# Patient Record
Sex: Female | Born: 1975 | Race: Black or African American | Hispanic: No | Marital: Single | Smoking: Current every day smoker
Health system: Southern US, Community
[De-identification: ages and names within clinical notes are randomized; demographics above are authoritative.]

## PROBLEM LIST (undated history)

## (undated) DIAGNOSIS — M1990 Unspecified osteoarthritis, unspecified site: Secondary | ICD-10-CM

## (undated) DIAGNOSIS — R519 Headache, unspecified: Secondary | ICD-10-CM

## (undated) DIAGNOSIS — T8859XA Other complications of anesthesia, initial encounter: Secondary | ICD-10-CM

## (undated) DIAGNOSIS — F32A Depression, unspecified: Secondary | ICD-10-CM

## (undated) DIAGNOSIS — J189 Pneumonia, unspecified organism: Secondary | ICD-10-CM

## (undated) DIAGNOSIS — Z87442 Personal history of urinary calculi: Secondary | ICD-10-CM

## (undated) HISTORY — DX: Unspecified osteoarthritis, unspecified site: M19.90

## (undated) HISTORY — PX: TUBAL LIGATION: SHX77

## (undated) HISTORY — PX: NEPHRECTOMY: SHX65

## (undated) HISTORY — PX: CARPAL TUNNEL RELEASE: SHX101

## (undated) HISTORY — PX: HERNIA REPAIR: SHX51

## (undated) HISTORY — PX: UMBILICAL HERNIA REPAIR: SHX2598

---

## 2012-12-28 ENCOUNTER — Encounter (HOSPITAL_COMMUNITY): Payer: Self-pay | Admitting: Emergency Medicine

## 2012-12-28 ENCOUNTER — Emergency Department (HOSPITAL_COMMUNITY): Payer: Self-pay

## 2012-12-28 ENCOUNTER — Emergency Department (HOSPITAL_COMMUNITY)
Admission: EM | Admit: 2012-12-28 | Discharge: 2012-12-28 | Disposition: A | Discharge status: Home / Self Care | Payer: Self-pay | Attending: Emergency Medicine | Admitting: Emergency Medicine

## 2012-12-28 DIAGNOSIS — S99929A Unspecified injury of unspecified foot, initial encounter: Secondary | ICD-10-CM | POA: Insufficient documentation

## 2012-12-28 DIAGNOSIS — S8990XA Unspecified injury of unspecified lower leg, initial encounter: Secondary | ICD-10-CM | POA: Insufficient documentation

## 2012-12-28 DIAGNOSIS — Y9389 Activity, other specified: Secondary | ICD-10-CM | POA: Insufficient documentation

## 2012-12-28 DIAGNOSIS — M25562 Pain in left knee: Secondary | ICD-10-CM

## 2012-12-28 DIAGNOSIS — Y92410 Unspecified street and highway as the place of occurrence of the external cause: Secondary | ICD-10-CM | POA: Insufficient documentation

## 2012-12-28 DIAGNOSIS — F17200 Nicotine dependence, unspecified, uncomplicated: Secondary | ICD-10-CM | POA: Insufficient documentation

## 2012-12-28 DIAGNOSIS — M25561 Pain in right knee: Secondary | ICD-10-CM

## 2012-12-28 MED ORDER — TRAMADOL HCL 50 MG PO TABS
50.0000 mg | ORAL_TABLET | Freq: Four times a day (QID) | ORAL | Status: DC | PRN
Start: 2012-12-28 — End: 2013-03-16

## 2012-12-28 MED ORDER — IBUPROFEN 600 MG PO TABS: 600.0000 mg | ORAL_TABLET | Freq: Four times a day (QID) | ORAL | Status: DC | PRN
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
## 2012-12-28 NOTE — ED Provider Notes (Signed)
Medical screening examination/treatment/procedure(s) were performed by non-physician practitioner and as supervising physician I was immediately available for consultation/collaboration.  Yariah Selvey, MD 12/28/12 1542 

## 2012-12-28 NOTE — ED Notes (Signed)
PA at the bedside at this time.

## 2013-03-16 ENCOUNTER — Encounter: Payer: Self-pay | Admitting: Internal Medicine

## 2013-03-16 ENCOUNTER — Ambulatory Visit: Payer: Self-pay | Attending: Internal Medicine | Admitting: Internal Medicine

## 2013-03-16 VITALS — BP 118/76 | HR 85 | Temp 98.6°F | Resp 16 | Ht 64.0 in | Wt 142.0 lb

## 2013-03-16 DIAGNOSIS — N920 Excessive and frequent menstruation with regular cycle: Secondary | ICD-10-CM | POA: Insufficient documentation

## 2013-03-16 DIAGNOSIS — R109 Unspecified abdominal pain: Secondary | ICD-10-CM

## 2013-03-16 DIAGNOSIS — Z905 Acquired absence of kidney: Secondary | ICD-10-CM | POA: Insufficient documentation

## 2013-03-16 DIAGNOSIS — R10A1 Flank pain, right side: Secondary | ICD-10-CM | POA: Insufficient documentation

## 2013-03-16 DIAGNOSIS — R300 Dysuria: Secondary | ICD-10-CM | POA: Insufficient documentation

## 2013-03-16 MED ORDER — CIPROFLOXACIN HCL 500 MG PO TABS: 500.0000 mg | ORAL_TABLET | Freq: Two times a day (BID) | ORAL | Status: DC

## 2013-03-16 MED ORDER — TRAMADOL HCL 50 MG PO TABS: 50.0000 mg | ORAL_TABLET | Freq: Four times a day (QID) | ORAL | Status: DC | PRN

## 2013-03-16 NOTE — Progress Notes (Signed)
Patient ID: Kristin Montes, female   DOB: 04-14-1976, 37 y.o.   MRN: [id number] Patient Demographics  Kristin Montes, is a 37 y.o. female  QMV:[id number]  MWU:[id number]  DOB - 1976/04/12  Chief Complaint  Patient presents with  . Establish Care    HX KIDNEY INFECTION  . Abdominal Pain  . Back Pain        Subjective:   Kristin Montes today is here to establish primary care.  The patient is a 37 year old female with history of left-sided nephrectomy due to kidney stones. She presented to the clinic For establishing care. Patient reports that for the last 3 days she's been having dysuria, foul-smelling urine, brownish. She is also noticed that she is having right flank pain, yesterday had chills but did not check temperature. Patient has No headache, No chest pain, No Nausea, No new weakness tingling or numbness, No Cough - SOB.   Objective:    Filed Vitals:   03/16/13 1731  BP: 118/76  Pulse: 85  Temp: 98.6 F (37 C)  TempSrc: Oral  Resp: 16  Height: 5\' 4"  (1.626 m)  Weight: 142 lb (64.411 kg)  SpO2: 99%     ALLERGIES:  No Known Allergies  PAST MEDICAL HISTORY: History reviewed. No pertinent past medical history.  PAST SURGICAL HISTORY: Past Surgical History  Procedure Laterality Date  . Hernia repair    . Tubal ligation    . Nephrectomy      FAMILY HISTORY: History reviewed. No pertinent family history.  MEDICATIONS AT HOME: Prior to Admission medications   Medication Sig Start Date End Date Taking? Authorizing Provider  ibuprofen (ADVIL,MOTRIN) 600 MG tablet Take 1 tablet (600 mg total) by mouth every 6 (six) hours as needed for pain. 12/28/12  Yes Renne Crigler, PA-C  ciprofloxacin (CIPRO) 500 MG tablet Take 1 tablet (500 mg total) by mouth 2 (two) times daily. 03/16/13   Ripudeep Jenna Luo, MD  traMADol (ULTRAM) 50 MG tablet Take 1 tablet (50 mg total) by mouth every 6 (six) hours as needed for pain. 03/16/13   Ripudeep Jenna Luo, MD    REVIEW OF SYSTEMS:   Constitutional:   No   Fevers, chills, fatigue.  HEENT:    No headaches, Sore throat,   Cardio-vascular: No chest pain,  Orthopnea, swelling in lower extremities, anasarca, palpitations  GI:  No abdominal pain, nausea, vomiting, diarrhea  Resp: No shortness of breath,  No coughing up of blood.No cough.No wheezing.  Skin:  no rash or lesions.  GU:  Please see history of present illness  Musculoskeletal: No joint pain or swelling.  No decreased range of motion.  No back pain.  Psych: No change in mood or affect. No depression or anxiety.  No memory loss.   Exam  General appearance :Awake, alert, NAD, Speech Clear. HEENT: Atraumatic and Normocephalic, PERLA Neck: supple, no JVD. No cervical lymphadenopathy.  Chest: clear to auscultation bilaterally, no wheezing, rales or rhonchi CVS: S1 S2 regular, no murmurs.  Abdomen: soft, NBS, NT, ND, no gaurding, rigidity or rebound. No CVAT Extremities: No cyanosis, clubbing, B/L Lower Ext shows no edema,  Neurology: Awake alert, and oriented X 3, CN II-XII intact, Non focal Skin:No Rash or lesions Wounds: N/A    Data Review   Basic Metabolic Panel: No results found for this basename: NA, K, CL, CO2, GLUCOSE, BUN, CREATININE, CALCIUM, MG, PHOS,  in the last 168 hours Liver Function Tests: No results found for this basename: AST, ALT, ALKPHOS, BILITOT, PROT, ALBUMIN,  in the last 168 hours  CBC: No results found for this basename: WBC, NEUTROABS, HGB, HCT, MCV, PLT,  in the last 168 hours ------------------------------------------------------------------------------------------------------------------ No results found for this basename: HGBA1C,  in the last 72 hours ------------------------------------------------------------------------------------------------------------------ No results found for this basename: CHOL, HDL, LDLCALC, TRIG, CHOLHDL, LDLDIRECT,  in the last 72  hours ------------------------------------------------------------------------------------------------------------------ No results found for this basename: TSH, T4TOTAL, FREET3, T3FREE, THYROIDAB,  in the last 72 hours ------------------------------------------------------------------------------------------------------------------ No results found for this basename: VITAMINB12, FOLATE, FERRITIN, TIBC, IRON, RETICCTPCT,  in the last 72 hours  Coagulation profile  No results found for this basename: INR, PROTIME,  in the last 168 hours    Assessment & Plan   Active Problems: Dysuria with right flank pain: Concerning for pyelonephritis with UTI, patient already has a history of left nephrectomy - Will check a urine dipstick, urine culture - Placed on ciprofloxacin 500 mg BID -Check CBC, BMET   Menorrhagia: - Will send a ambulatory referral to OB/GYN for Pap smear and evaluation of menorrhagia Patient reports that she had tubal ligation  Recommendations:Followup on the labs  Follow-up in 3 weeks   RAI,RIPUDEEP M.D. 03/16/2013, 6:09 PM

## 2013-03-16 NOTE — Progress Notes (Signed)
PT HERE TO ESTABLISH CARE S/P R NEPHRECTOMY 2003 C/O FLARE UP R FLANK PAIN RADIATING TO BACK TAKING ALEVE LMP 03/07/13 DENIES HEMATURIA,AFEBRILE

## 2013-03-30 ENCOUNTER — Ambulatory Visit: Payer: Self-pay

## 2013-04-11 ENCOUNTER — Ambulatory Visit: Payer: Self-pay | Admitting: Family Medicine

## 2013-04-11 ENCOUNTER — Ambulatory Visit: Payer: Self-pay

## 2013-04-18 ENCOUNTER — Ambulatory Visit: Payer: Self-pay

## 2013-12-16 ENCOUNTER — Emergency Department (HOSPITAL_COMMUNITY)
Admission: EM | Admit: 2013-12-16 | Discharge: 2013-12-16 | Disposition: A | Discharge status: Home / Self Care | Payer: No Typology Code available for payment source | Attending: Emergency Medicine | Admitting: Emergency Medicine

## 2013-12-16 ENCOUNTER — Encounter (HOSPITAL_COMMUNITY): Payer: Self-pay | Admitting: Emergency Medicine

## 2013-12-16 DIAGNOSIS — Z905 Acquired absence of kidney: Secondary | ICD-10-CM | POA: Insufficient documentation

## 2013-12-16 DIAGNOSIS — R1084 Generalized abdominal pain: Secondary | ICD-10-CM | POA: Insufficient documentation

## 2013-12-16 DIAGNOSIS — R197 Diarrhea, unspecified: Secondary | ICD-10-CM | POA: Insufficient documentation

## 2013-12-16 DIAGNOSIS — Z9851 Tubal ligation status: Secondary | ICD-10-CM | POA: Insufficient documentation

## 2013-12-16 DIAGNOSIS — R509 Fever, unspecified: Secondary | ICD-10-CM | POA: Insufficient documentation

## 2013-12-16 DIAGNOSIS — F17200 Nicotine dependence, unspecified, uncomplicated: Secondary | ICD-10-CM | POA: Insufficient documentation

## 2013-12-16 DIAGNOSIS — R1012 Left upper quadrant pain: Secondary | ICD-10-CM | POA: Insufficient documentation

## 2013-12-16 DIAGNOSIS — Z792 Long term (current) use of antibiotics: Secondary | ICD-10-CM | POA: Insufficient documentation

## 2013-12-16 DIAGNOSIS — Z3202 Encounter for pregnancy test, result negative: Secondary | ICD-10-CM | POA: Insufficient documentation

## 2013-12-16 DIAGNOSIS — R112 Nausea with vomiting, unspecified: Secondary | ICD-10-CM | POA: Insufficient documentation

## 2013-12-16 DIAGNOSIS — Z87442 Personal history of urinary calculi: Secondary | ICD-10-CM | POA: Insufficient documentation

## 2013-12-16 DIAGNOSIS — Z791 Long term (current) use of non-steroidal anti-inflammatories (NSAID): Secondary | ICD-10-CM | POA: Insufficient documentation

## 2013-12-16 LAB — CBC WITH DIFFERENTIAL/PLATELET
Basophils Absolute: 0 10*3/uL (ref 0.0–0.1)
Basophils Relative: 0 % (ref 0–1)
Eosinophils Absolute: 0.1 10*3/uL (ref 0.0–0.7)
Eosinophils Relative: 2 % (ref 0–5)
HCT: 41.5 % (ref 36.0–46.0)
HEMOGLOBIN: 14.4 g/dL (ref 12.0–15.0)
LYMPHS ABS: 0.5 10*3/uL — AB (ref 0.7–4.0)
Lymphocytes Relative: 8 % — ABNORMAL LOW (ref 12–46)
MCH: 32.6 pg (ref 26.0–34.0)
MCHC: 34.7 g/dL (ref 30.0–36.0)
MCV: 93.9 fL (ref 78.0–100.0)
MONOS PCT: 6 % (ref 3–12)
Monocytes Absolute: 0.4 10*3/uL (ref 0.1–1.0)
NEUTROS ABS: 5.5 10*3/uL (ref 1.7–7.7)
NEUTROS PCT: 84 % — AB (ref 43–77)
Platelets: 173 10*3/uL (ref 150–400)
RBC: 4.42 MIL/uL (ref 3.87–5.11)
RDW: 13.2 % (ref 11.5–15.5)
WBC: 6.6 10*3/uL (ref 4.0–10.5)

## 2013-12-16 LAB — COMPREHENSIVE METABOLIC PANEL
ALBUMIN: 3.8 g/dL (ref 3.5–5.2)
ALK PHOS: 77 U/L (ref 39–117)
ALT: 14 U/L (ref 0–35)
AST: 20 U/L (ref 0–37)
BILIRUBIN TOTAL: 0.5 mg/dL (ref 0.3–1.2)
BUN: 14 mg/dL (ref 6–23)
CHLORIDE: 105 meq/L (ref 96–112)
CO2: 21 mEq/L (ref 19–32)
Calcium: 9.2 mg/dL (ref 8.4–10.5)
Creatinine, Ser: 0.89 mg/dL (ref 0.50–1.10)
GFR calc non Af Amer: 82 mL/min — ABNORMAL LOW (ref >90)
GLUCOSE: 95 mg/dL (ref 70–99)
POTASSIUM: 4.2 meq/L (ref 3.7–5.3)
Sodium: 139 mEq/L (ref 137–147)
Total Protein: 7.4 g/dL (ref 6.0–8.3)

## 2013-12-16 LAB — URINALYSIS, ROUTINE W REFLEX MICROSCOPIC
BILIRUBIN URINE: NEGATIVE
Glucose, UA: NEGATIVE mg/dL
HGB URINE DIPSTICK: NEGATIVE
KETONES UR: 15 mg/dL — AB
Leukocytes, UA: NEGATIVE
NITRITE: NEGATIVE
PH: 5.5 (ref 5.0–8.0)
Protein, ur: NEGATIVE mg/dL
Specific Gravity, Urine: 1.022 (ref 1.005–1.030)
Urobilinogen, UA: 0.2 mg/dL (ref 0.0–1.0)

## 2013-12-16 LAB — POC URINE PREG, ED: PREG TEST UR: NEGATIVE

## 2013-12-16 LAB — LIPASE, BLOOD: Lipase: 92 U/L — ABNORMAL HIGH (ref 11–59)

## 2013-12-16 MED ORDER — ONDANSETRON HCL 4 MG/2ML IJ SOLN
4.0000 mg | Freq: Once | INTRAMUSCULAR | Status: AC
  Administered 2013-12-16: 4 mg via INTRAVENOUS
  Filled 2013-12-16: qty 2

## 2013-12-16 MED ORDER — MORPHINE SULFATE 4 MG/ML IJ SOLN: 4.0000 mg | INTRAMUSCULAR | Status: DC | PRN

## 2013-12-16 MED ORDER — OXYCODONE-ACETAMINOPHEN 5-325 MG PO TABS: 1.0000 | ORAL_TABLET | ORAL | Status: DC | PRN

## 2013-12-16 MED ORDER — SODIUM CHLORIDE 0.9 % IV BOLUS (SEPSIS)
1000.0000 mL | Freq: Once | INTRAVENOUS | Status: AC
  Administered 2013-12-16: 1000 mL via INTRAVENOUS

## 2013-12-16 MED ORDER — ONDANSETRON HCL 4 MG PO TABS: 4.0000 mg | ORAL_TABLET | Freq: Four times a day (QID) | ORAL | Status: DC

## 2013-12-16 NOTE — ED Notes (Signed)
Pt states she has been nauseas off and on with diarrhea X 4 and vomiting X 3 since this morning, mild abdominal pain to LLQ, non tender to palpation. States she ate seafood last night. Pt son was sick with a virus the other day, lasted one day.

## 2013-12-16 NOTE — ED Notes (Signed)
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

## 2013-12-16 NOTE — ED Notes (Signed)
Pt informed to follow up to check pancreas with primary care doctor, avoid caffeine and greasy foods, and to increase clear liquids.

## 2013-12-16 NOTE — ED Provider Notes (Signed)
CSN: [id number]     Arrival date & time 12/16/13  1421 History   First MD Initiated Contact with Patient 12/16/13 1632     Chief Complaint  Patient presents with  . Emesis     (Consider location/radiation/quality/duration/timing/severity/associated sxs/prior Treatment) HPI Comments: Patient is a 38 year old female with history of nephrectomy who presents today with nausea, vomiting, diarrhea 7 AM today. She generally has not felt well. Her emesis is nonbilious, nonbloody. She has mild, generalized, crampy abdominal pain. She believes she has a fever because she feels warm, but has not measured it. She has not taken any medications for her symptoms including any antipyretic. 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 She denies any abnormal food or recent travel. Her son had similar symptoms which resolved without 48 hours.  The history is provided by the patient. No language interpreter was used.    History reviewed. No pertinent past medical history. Past Surgical History  Procedure Laterality Date  . Hernia repair    . Tubal ligation    . Nephrectomy     History reviewed. No pertinent family history. History  Substance Use Topics  . Smoking status: Current Every Day Smoker    Types: Cigarettes  . Smokeless tobacco: Not on file     Comment: 6 cigs/day  . Alcohol Use: Yes     Comment: occasionally   OB History   Grav Para Term Preterm Abortions TAB SAB Ect Mult Living                 Review of Systems  Constitutional: Positive for fever (subjective).  Respiratory: Negative for shortness of breath.   Cardiovascular: Negative for chest pain.  Gastrointestinal: Positive for nausea, vomiting, abdominal pain and diarrhea. Negative for blood in stool and abdominal distention.  All other systems reviewed and are negative.     Allergies  Review of patient's allergies indicates no known allergies.  Home Medications   Prior to  Admission medications   Medication Sig Start Date End Date Taking? Authorizing Provider  ciprofloxacin (CIPRO) 500 MG tablet Take 1 tablet (500 mg total) by mouth 2 (two) times daily. 03/16/13   Ripudeep Jenna LuoK Rai, MD  ibuprofen (ADVIL,MOTRIN) 600 MG tablet Take 1 tablet (600 mg total) by mouth every 6 (six) hours as needed for pain. 12/28/12   Renne CriglerJoshua Geiple, PA-C  traMADol (ULTRAM) 50 MG tablet Take 1 tablet (50 mg total) by mouth every 6 (six) hours as needed for pain. 03/16/13   Ripudeep Jenna LuoK Rai, MD   BP 117/77  Pulse 64  Temp(Src) 98.1 F (36.7 C) (Oral)  Resp 14  Ht 5\' 4"  (1.626 m)  Wt 133 lb (60.328 kg)  BMI 22.82 kg/m2  SpO2 99%  LMP 11/30/2013 Physical Exam  Nursing note and vitals reviewed. Constitutional: She is oriented to person, place, and time. She appears well-developed and well-nourished. She does not appear ill. No distress.  Patient is laying comfortably in bed.   HENT:  Head: Normocephalic and atraumatic.  Right Ear: External ear normal.  Left Ear: External ear normal.  Nose: Nose normal.  Mouth/Throat: Oropharynx is clear and moist.  Eyes: Conjunctivae are normal.  Neck: Normal range of motion.  Cardiovascular: Normal rate, regular rhythm and normal heart sounds.   Pulmonary/Chest: Effort normal and breath sounds normal. No stridor. No respiratory distress. She has no wheezes. She has no rales.  Abdominal: Soft. She exhibits no distension.  There is generalized tenderness.  Mild generalized tenderness to deep palpation. Worse in LUQ.   Musculoskeletal: Normal range of motion.  Neurological: She is alert and oriented to person, place, and time. She has normal strength.  Skin: Skin is warm and dry. She is not diaphoretic. No erythema.  Psychiatric: She has a normal mood and affect. Her behavior is normal.    ED Course  Procedures (including critical care time) Labs Review Labs Reviewed  CBC WITH DIFFERENTIAL - Abnormal; Notable for the following:    Neutrophils  Relative % 84 (*)    Lymphocytes Relative 8 (*)    Lymphs Abs 0.5 (*)    All other components within normal limits  COMPREHENSIVE METABOLIC PANEL - Abnormal; Notable for the following:    GFR calc non Af Amer 82 (*)    All other components within normal limits  URINALYSIS, ROUTINE W REFLEX MICROSCOPIC - Abnormal; Notable for the following:    Ketones, ur 15 (*)    All other components within normal limits  LIPASE, BLOOD - Abnormal; Notable for the following:    Lipase 92 (*)    All other components within normal limits  POC URINE PREG, ED    Imaging Review No results found.   EKG Interpretation None      MDM   Final diagnoses:  Nausea & vomiting  Diarrhea   Patient is nontoxic, nonseptic appearing, in no apparent distress.  Patient's pain and other symptoms adequately managed in emergency department.  Fluid bolus given.  Labs and vitals reviewed.  Patient does not meet the SIRS or Sepsis criteria.  On repeat exam patient does not have a surgical abdomen and there are nor peritoneal signs.  No indication of appendicitis, bowel obstruction, bowel perforation, cholecystitis, diverticulitis, PID or ectopic pregnancy. Patient does have an elevated lipase. I discussed this with the patient who will follow up to ensure this resolves. Discussed clear liquid diet. Patient discharged home with symptomatic treatment and given strict instructions for follow-up with their primary care physician.  I have also discussed reasons to return immediately to the ER.  Patient expresses understanding and agrees with plan. Discussed case with Dr. Karma Ganja who agrees with plan. Patient / Family / Caregiver informed of clinical course, understand medical decision-making process, and agree with plan.       Mora Bellman, PA-C 12/17/13 1037

## 2013-12-16 NOTE — Discharge Instructions (Signed)
Viral Infections °A virus is a type of germ. Viruses can cause: °· Minor sore throats. °· Aches and pains. °· Headaches. °· Runny nose. °· Rashes. °· Watery eyes. °· Tiredness. °· Coughs. °· Loss of appetite. °· Feeling sick to your stomach (nausea). °· Throwing up (vomiting). °· Watery poop (diarrhea). °HOME CARE  °· Only take medicines as told by your doctor. °· Drink enough water and fluids to keep your pee (urine) clear or pale yellow. Sports drinks are a good choice. °· Get plenty of rest and eat healthy. Soups and broths with crackers or rice are fine. °GET HELP RIGHT AWAY IF:  °· You have a very bad headache. °· You have shortness of breath. °· You have chest pain or neck pain. °· You have an unusual rash. °· You cannot stop throwing up. °· You have watery poop that does not stop. °· You cannot keep fluids down. °· You or your child has a temperature by mouth above 102° F (38.9° C), not controlled by medicine. °· Your baby is older than 3 months with a rectal temperature of 102° F (38.9° C) or higher. °· Your baby is 3 months old or younger with a rectal temperature of 100.4° F (38° C) or higher. °MAKE SURE YOU:  °· Understand these instructions. °· Will watch this condition. °· Will get help right away if you are not doing well or get worse. °Document Released: 06/10/2008 Document Revised: 09/20/2011 Document Reviewed: 11/03/2010 °[employer]® Patient Information ©2014 [employer]. ° °

## 2013-12-19 NOTE — ED Provider Notes (Signed)
Medical screening examination/treatment/procedure(s) were performed by non-physician practitioner and as supervising physician I was immediately available for consultation/collaboration.   EKG Interpretation None       Derak Schurman K Linker, MD 12/19/13 1503 

## 2017-12-28 ENCOUNTER — Emergency Department (HOSPITAL_COMMUNITY)
Admission: EM | Admit: 2017-12-28 | Discharge: 2017-12-28 | Disposition: A | Discharge status: Home / Self Care | Payer: Self-pay | Attending: Emergency Medicine | Admitting: Emergency Medicine

## 2017-12-28 ENCOUNTER — Encounter (HOSPITAL_COMMUNITY): Payer: Self-pay | Admitting: Emergency Medicine

## 2017-12-28 ENCOUNTER — Emergency Department (HOSPITAL_COMMUNITY): Payer: Self-pay

## 2017-12-28 DIAGNOSIS — S93401A Sprain of unspecified ligament of right ankle, initial encounter: Secondary | ICD-10-CM | POA: Insufficient documentation

## 2017-12-28 DIAGNOSIS — W1842XA Slipping, tripping and stumbling without falling due to stepping into hole or opening, initial encounter: Secondary | ICD-10-CM | POA: Insufficient documentation

## 2017-12-28 DIAGNOSIS — Y939 Activity, unspecified: Secondary | ICD-10-CM | POA: Insufficient documentation

## 2017-12-28 DIAGNOSIS — Z79899 Other long term (current) drug therapy: Secondary | ICD-10-CM | POA: Insufficient documentation

## 2017-12-28 DIAGNOSIS — Y929 Unspecified place or not applicable: Secondary | ICD-10-CM | POA: Insufficient documentation

## 2017-12-28 DIAGNOSIS — F17210 Nicotine dependence, cigarettes, uncomplicated: Secondary | ICD-10-CM | POA: Insufficient documentation

## 2017-12-28 DIAGNOSIS — Y999 Unspecified external cause status: Secondary | ICD-10-CM | POA: Insufficient documentation

## 2017-12-28 NOTE — ED Notes (Signed)
Patient transported to X-ray 

## 2017-12-28 NOTE — ED Provider Notes (Signed)
[facility] EMERGENCY DEPARTMENT Provider Note   CSN: [id number] Arrival date & time: 12/28/17  1024     History   Chief Complaint Chief Complaint  Patient presents with  . Ankle Pain    HPI Kristin Montes is a 42 y.o. female presenting for evaluation of right ankle pain.  Patient states she developed acute onset right ankle pain after stepping into a hole on Monday night.  She treated this with rest, ice, elevation, NSAIDs, and Tylenol yesterday.  When she woke up this morning, pain was still present and ankle still swollen.  She is here to get this checked.  She denies numbness or tingling.  She denies injury elsewhere.  She is able to walk, but walks with a limp due to pain.  She has a history of a nephrectomy, states she has no problems taking NSAIDs.  She denies any other medical problems, does not take medications daily.  Pain is constant, worse with movement and palpation.  No radiation of the pain.  Nothing has made it better.  HPI  History reviewed. No pertinent past medical history.  Patient Active Problem List   Diagnosis Date Noted  . 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 Right flank pain 03/16/2013  . Dysuria 03/16/2013    Past Surgical History:  Procedure Laterality Date  . HERNIA REPAIR    . NEPHRECTOMY    . TUBAL LIGATION       OB History   None      Home Medications    Prior to Admission medications   Medication Sig Start Date End Date Taking? Authorizing Provider  ondansetron (ZOFRAN) 4 MG tablet Take 1 tablet (4 mg total) by mouth every 6 (six) hours. 12/16/13   Junious Silk, PA-C  oxyCODONE-acetaminophen (PERCOCET/ROXICET) 5-325 MG per tablet Take 1 tablet by mouth every 4 (four) hours as needed for severe pain. May take 2 tablets PO q 6 hours for severe pain - Do not take with Tylenol as this tablet already contains tylenol 12/16/13   Junious Silk, PA-C    Family History No family history on file.  Social History Social History     Tobacco Use  . Smoking status: Current Every Day Smoker    Types: Cigarettes  . Tobacco comment: 6 cigs/day  Substance Use Topics  . Alcohol use: Yes    Comment: occasionally  . Drug use: No     Allergies   Patient has no known allergies.   Review of Systems Review of Systems  Musculoskeletal: Positive for arthralgias and joint swelling.  Neurological: Negative for numbness.     Physical Exam Updated Vital Signs BP (!) 134/103   Pulse 93   Temp 98.1 F (36.7 C) (Oral)   Resp 12   LMP 12/07/2017   SpO2 100%   Physical Exam  Constitutional: She is oriented to person, place, and time. She appears well-developed and well-nourished. No distress.  HENT:  Head: Normocephalic and atraumatic.  Eyes: EOM are normal.  Neck: Normal range of motion.  Pulmonary/Chest: Effort normal.  Abdominal: She exhibits no distension.  Musculoskeletal: She exhibits edema and tenderness.  Swelling of the lateral malleolus and dorsal lateral right ankle.  Pedal pulses intact.  Pain with palpation of the lateral aspect of the ankle.  Good cap refill.  Able to wiggle toes without difficulty.  Unable/unwilling to range ankle due to pain.  Sensation intact bilaterally.  Neurological: She is alert and oriented to person, place, and time. No sensory deficit.  Skin: Skin is warm. Capillary refill takes less than 2 seconds. No rash noted.  Psychiatric: She has a normal mood and affect.  Nursing note and vitals reviewed.    ED Treatments / Results  Labs (all labs ordered are listed, but only abnormal results are displayed) Labs Reviewed - No data to display  EKG None  Radiology Dg Ankle Complete Right  Result Date: 12/28/2017 CLINICAL DATA:  Stepped in hole 2 days ago with persistent ankle pain, initial encounter EXAM: RIGHT ANKLE - COMPLETE 3+ VIEW COMPARISON:  None. FINDINGS: There is no evidence of fracture, dislocation, or joint effusion. There is no evidence of arthropathy or other  focal bone abnormality. Soft tissues are unremarkable. IMPRESSION: No acute abnormality noted. Electronically Signed   By: Alcide CleverMark  Lukens M.D.   On: 12/28/2017 10:59    Procedures Procedures (including critical care time)  Medications Ordered in ED Medications - No data to display   Initial Impression / Assessment and Plan / ED Course  I have reviewed the triage vital signs and the nursing notes.  Pertinent labs & imaging results that were available during my care of the patient were reviewed by me and considered in my medical decision making (see chart for details).     Patient presenting for evaluation of ankle pain.  Physical exam reassuring, she is neurovascularly intact.  X-ray viewed and interpreted by me, no fracture or dislocation.  Discussed findings with patient.  Discussed this is likely an ankle sprain.  Will treat with ASO brace, NSAIDs, Tylenol, ice, and crutches as needed for pain.  Discussed with patient.  Discussed follow-up as needed.  At this time, patient appears safe for discharge.  Return precautions given.  Patient states she understands and agrees to plan.   Final Clinical Impressions(s) / ED Diagnoses   Final diagnoses:  Sprain of right ankle, unspecified ligament, initial encounter    ED Discharge Orders    None       Alveria ApleyCaccavale, Jovon Streetman, PA-C 12/28/17 1209    Mesner, Barbara CowerJason, MD 12/29/17 [phone]

## 2017-12-28 NOTE — Discharge Instructions (Addendum)
1. Medications: Take ibuprofen 3 times a day with meals OR aleve 2 times a day.  Do not take other anti-inflammatories at the same time open (Advil, Motrin, naproxen). You may supplement with Tylenol if you need further pain control. 2. Treatment: Wear ankle brace for at least 2 weeks for stabilization of ankle. Use crutches as needed for comfort. Ice and elevate ankle throughout the day. Alternate between ibuprofen and tylenol for pain relief.  3. Follow-up: Return to the ER if you develop numbness, your foot turns white, or you develop severe worsening pain.

## 2017-12-28 NOTE — ED Triage Notes (Signed)
Pt reports stepping In a hole on Monday, right ankle pain since. Pt ambulatory to triage with limp

## 2017-12-28 NOTE — Progress Notes (Signed)
Orthopedic Tech Progress Note Patient Details:  Kristin Montes [birth year] [id number]  Ortho Devices Type of Ortho Device: Crutches Ortho Device/Splint Interventions: Application   Post Interventions Patient Tolerated: Well Instructions Provided: Care of device Nursing staff applied the aso  Nikki DomCrawford, Shawnika Pepin 12/28/2017, 11:53 AM

## 2018-04-24 ENCOUNTER — Ambulatory Visit: Payer: Medicaid Other | Admitting: Family Medicine

## 2018-04-27 ENCOUNTER — Emergency Department (HOSPITAL_COMMUNITY)
Admission: EM | Admit: 2018-04-27 | Discharge: 2018-04-27 | Discharge status: Against Medical Advice | Payer: Medicaid Other | Attending: Emergency Medicine | Admitting: Emergency Medicine

## 2018-04-27 ENCOUNTER — Encounter (HOSPITAL_COMMUNITY): Payer: Self-pay

## 2018-04-27 DIAGNOSIS — T7840XA Allergy, unspecified, initial encounter: Secondary | ICD-10-CM | POA: Insufficient documentation

## 2018-04-27 DIAGNOSIS — Z5320 Procedure and treatment not carried out because of patient's decision for unspecified reasons: Secondary | ICD-10-CM | POA: Insufficient documentation

## 2018-04-27 DIAGNOSIS — F17210 Nicotine dependence, cigarettes, uncomplicated: Secondary | ICD-10-CM | POA: Insufficient documentation

## 2018-04-27 NOTE — ED Provider Notes (Signed)
[facility]-EMERGENCY DEPT Provider Note   CSN: [id number] Arrival date & time: 04/27/18  1412   History   Chief Complaint Chief Complaint  Patient presents with  . throat irritation    HPI Kristin Montes is a 42 y.o. female with a past history significant for nephrectomy who presents for evaluation of throat irritation.  Per patient she states she was at work and ate approximately 5 pistachios, which she has never eaten before and developed symptoms of throat swelling.  Per patient she states that she felt like she could barely take a deep breath that the inside of her "throat was closing in." Patient states her employer called 911 and the fire station administered an epinephrine injection.  Patient states she felt better after this, however states she feels like she has abnormal sensation to the left side of her throat. Has never had an episode of anaphylaxis previously.  Denies fever, chills,neck pain, neck stiffness, chest pain, shortness of breath, headache, vomiting, diarrhea, rash, hives, lightheadedness, dizziness, swelling of the face or tongue.  HPI  History reviewed. No pertinent past medical history.  Patient Active Problem List   Diagnosis Date Noted  . 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 Right flank pain 03/16/2013  . Dysuria 03/16/2013    Past Surgical History:  Procedure Laterality Date  . HERNIA REPAIR    . NEPHRECTOMY    . TUBAL LIGATION       OB History   None      Home Medications    Prior to Admission medications   Medication Sig Start Date End Date Taking? Authorizing Provider  ondansetron (ZOFRAN) 4 MG tablet Take 1 tablet (4 mg total) by mouth every 6 (six) hours. 12/16/13   Junious Silk, PA-C  oxyCODONE-acetaminophen (PERCOCET/ROXICET) 5-325 MG per tablet Take 1 tablet by mouth every 4 (four) hours as needed for severe pain. May take 2 tablets PO q 6 hours for severe pain - Do not take with Tylenol as this tablet already contains  tylenol 12/16/13   Junious Silk, PA-C    Family History No family history on file.  Social History Social History   Tobacco Use  . Smoking status: Current Every Day Smoker    Types: Cigarettes  . Tobacco comment: 6 cigs/day  Substance Use Topics  . Alcohol use: Yes    Comment: occasionally  . Drug use: No     Allergies   Patient has no known allergies.   Review of Systems Review of Systems  Constitutional: Negative.   HENT: Positive for sore throat. Negative for congestion, dental problem, drooling, ear discharge, ear pain, facial swelling, hearing loss, mouth sores, nosebleeds, postnasal drip, rhinorrhea, sinus pressure, sinus pain, sneezing, tinnitus, trouble swallowing and voice change.   Respiratory: Negative.   Cardiovascular: Negative.   Gastrointestinal: Negative.   Genitourinary: Negative.   Musculoskeletal: Negative.   Skin: Negative.   Allergic/Immunologic: Negative for environmental allergies, food allergies and immunocompromised state.  Neurological: Negative.   All other systems reviewed and are negative.    Physical Exam Updated Vital Signs BP (!) 145/90   Pulse 68   Temp 97.9 F (36.6 C)   Resp 15   Ht 5\' 5"  (1.651 m)   Wt 68.9 kg   LMP 04/16/2018   SpO2 100%   BMI 25.29 kg/m   Physical Exam  Constitutional: She appears well-developed and well-nourished. No distress.  HENT:  Head: Atraumatic.  Right Ear: Tympanic membrane, external ear and ear canal normal. No  drainage, swelling or tenderness. Tympanic membrane is not perforated, not erythematous, not retracted and not bulging.  Left Ear: Tympanic membrane, external ear and ear canal normal. No drainage, swelling or tenderness. Tympanic membrane is not perforated, not erythematous, not retracted and not bulging.  Nose: Nose normal.  Mouth/Throat: Uvula is midline, oropharynx is clear and moist and mucous membranes are normal. No tonsillar exudate.  Uvula midline, no evidence of  oropharyngeal or tongue swelling.  No elevation of palate.  Eyes: Pupils are equal, round, and reactive to light.  Neck: Trachea normal, normal range of motion, full passive range of motion without pain and phonation normal. Neck supple. No tracheal tenderness present. No neck rigidity. No tracheal deviation, no edema, no erythema and normal range of motion present.  Cardiovascular: Normal rate, regular rhythm, normal heart sounds, intact distal pulses and normal pulses.  Pulmonary/Chest: Effort normal and breath sounds normal. No accessory muscle usage or stridor. No tachypnea. No respiratory distress. She has no decreased breath sounds. She has no wheezes. She has no rhonchi. She has no rales.  Lungs clear to auscultation bilaterally.  Is able to speak without difficulty.  Abdominal: Soft. Normal appearance. She exhibits no shifting dullness, no distension, no pulsatile liver, no fluid wave, no abdominal bruit, no ascites, no pulsatile midline mass and no mass. There is no hepatosplenomegaly. There is no tenderness. No hernia.  Musculoskeletal: Normal range of motion.  Neurological: She is alert.  Skin: Skin is warm, dry and intact. No purpura and no rash noted. Rash is not macular, not papular, not nodular, not pustular, not vesicular and not urticarial. She is not diaphoretic. No pallor.  No urticaria, lesions or rashes.  Psychiatric: She has a normal mood and affect.  Nursing note and vitals reviewed.    ED Treatments / Results  Labs (all labs ordered are listed, but only abnormal results are displayed) Labs Reviewed - No data to display  EKG None  Radiology No results found.  Procedures Procedures (including critical care time)  Medications Ordered in ED Medications - No data to display   Initial Impression / Assessment and Plan / ED Course  I have reviewed the triage vital signs and the nursing notes.  Pertinent labs & imaging results that were available during my care of  the patient were reviewed by me and considered in my medical decision making (see chart for details).  42 year old otherwise well-appearing female presents for evaluation of throat irritation.  Irritation occurred after eating 5 pistachios.  Sensation of throat swelling and shortness of breath at work.  Administered epinephrine by fire rescue with relief of symptoms.  No symptoms on initial evaluation.  No skin manifestations.  Patient does not appear in any distress. No episodes of hypoxia while in the department.  Discussed with patient plan, treatment and observation for possible allergic reaction.  She refuses to stay.  Discussed with patient risks of leaving without treatment included additional reaction or death due to allergic reaction.  Patient states to this provider and nursing staff she is chosen to leave AGAINST MEDICAL ADVICE.    Final Clinical Impressions(s) / ED Diagnoses   Final diagnoses:  Allergic reaction, initial encounter    ED Discharge Orders    None       Henderly, Britni A, PA-C 04/27/18 1543    Tilden Fossa, MD 04/28/18 [phone]

## 2018-04-27 NOTE — ED Triage Notes (Signed)
Per ems: Pt ate 5 pistachios around 13:15.  Pt had never had pistachios before.  No pt hx of food or nut allergies.  Fire gave epi when there arrived and placed her on non-rebreather.  When EMS arrived they took pt off O2, and saw no signs of reaction or respiratory distress.

## 2018-04-27 NOTE — ED Notes (Signed)
Bed: WA20 Expected date:  Expected time:  Means of arrival:  Comments: EMS allergic reaction? Throat irriation

## 2018-08-03 ENCOUNTER — Ambulatory Visit: Payer: Medicaid Other | Admitting: Family Medicine

## 2019-01-02 ENCOUNTER — Encounter (HOSPITAL_COMMUNITY): Payer: Self-pay

## 2019-01-02 ENCOUNTER — Ambulatory Visit (HOSPITAL_COMMUNITY)
Admission: EM | Admit: 2019-01-02 | Discharge: 2019-01-02 | Disposition: A | Discharge status: Home / Self Care | Payer: Medicaid Other | Attending: Family Medicine | Admitting: Family Medicine

## 2019-01-02 ENCOUNTER — Other Ambulatory Visit: Payer: Self-pay

## 2019-01-02 DIAGNOSIS — W19XXXD Unspecified fall, subsequent encounter: Secondary | ICD-10-CM

## 2019-01-02 DIAGNOSIS — S8001XD Contusion of right knee, subsequent encounter: Secondary | ICD-10-CM

## 2019-01-02 MED ORDER — IBUPROFEN 600 MG PO TABS: 600.0000 mg | ORAL_TABLET | Freq: Four times a day (QID) | ORAL | 0 refills | Status: DC | PRN

## 2019-01-02 NOTE — ED Triage Notes (Signed)
Pt states she fell on Sunday. Pt states she tripped over a guard rail. Pt cc right leg pain .

## 2019-01-02 NOTE — ED Provider Notes (Signed)
[facility]    CSN: [id number] Arrival date & time: 01/02/19  1421     History   Chief Complaint Chief Complaint  Patient presents with  . Fall    HPI Kristin Montes is a 43 y.o. female. Presents today with right knee pain s/p tripped over railing on 12/31/2018; states she did not lift her flipflop up high enough and fell. Denies dizziness or palpitations before fall. States [place] was [place] in [place] in [place] yesterday; had xray which was negative. She states she was sent home from work today due to pain and needs work note. LMP: 12/29/2018.    History reviewed. No pertinent past medical history.  Patient Active Problem List   Diagnosis Date Noted  . 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 Right flank pain 03/16/2013  . Dysuria 03/16/2013    Past Surgical History:  Procedure Laterality Date  . HERNIA REPAIR    . NEPHRECTOMY    . TUBAL LIGATION      OB History   No obstetric history on file.      Home Medications    Prior to Admission medications   Medication Sig Start Date End Date Taking? Authorizing Provider  ibuprofen (ADVIL) 600 MG tablet Take 1 tablet (600 mg total) by mouth every 6 (six) hours as needed. 01/02/19   Mickie Bailate, Corley Maffeo H, NP  ondansetron (ZOFRAN) 4 MG tablet Take 1 tablet (4 mg total) by mouth every 6 (six) hours. 12/16/13   Junious SilkMerrell, Hannah, PA-C  oxyCODONE-acetaminophen (PERCOCET/ROXICET) 5-325 MG per tablet Take 1 tablet by mouth every 4 (four) hours as needed for severe pain. May take 2 tablets PO q 6 hours for severe pain - Do not take with Tylenol as this tablet already contains tylenol 12/16/13   Junious SilkMerrell, Hannah, PA-C    Family History History reviewed. No pertinent family history.  Social History Social History   Tobacco Use  . Smoking status: Current Every Day Smoker    Types: Cigarettes  . Smokeless tobacco: Never Used  . Tobacco comment: 6 cigs/day  Substance Use Topics  . Alcohol use: Yes    Comment: occasionally  . Drug use: No      Allergies   Patient has no known allergies.   Review of Systems Review of Systems  Constitutional: Negative for chills and fever.  HENT: Negative for ear pain and sore throat.   Eyes: Negative for pain and visual disturbance.  Respiratory: Negative for cough and shortness of breath.   Cardiovascular: Negative for chest pain and palpitations.  Gastrointestinal: Negative for abdominal pain and vomiting.  Genitourinary: Negative for dysuria and hematuria.  Musculoskeletal: Positive for arthralgias and gait problem. Negative for back pain.  Skin: Negative for color change and rash.  Neurological: Negative for seizures and syncope.  All other systems reviewed and are negative.    Physical Exam Triage Vital Signs ED Triage Vitals  Enc Vitals Group     BP 01/02/19 1452 127/81     Pulse Rate 01/02/19 1452 75     Resp 01/02/19 1452 18     Temp 01/02/19 1452 98.8 F (37.1 C)     Temp Source 01/02/19 1452 Oral     SpO2 01/02/19 1452 100 %     Weight 01/02/19 1447 150 lb (68 kg)     Height --      Head Circumference --      Peak Flow --      Pain Score 01/02/19 1447 10  Pain Loc --      Pain Edu? --      Excl. in [place]? --    No data found.  Updated Vital Signs BP 127/81 (BP Location: Right Arm)   Pulse 75   Temp 98.8 F (37.1 C) (Oral)   Resp 18   Wt 150 lb (68 kg)   LMP 12/29/2018   SpO2 100%   BMI 24.96 kg/m   Visual Acuity Right Eye Distance:   Left Eye Distance:   Bilateral Distance:    Right Eye Near:   Left Eye Near:    Bilateral Near:     Physical Exam Vitals signs and nursing note reviewed.  Constitutional:      General: She is not in acute distress.    Appearance: She is well-developed.  HENT:     Head: Normocephalic and atraumatic.  Eyes:     Conjunctiva/sclera: Conjunctivae normal.  Neck:     Musculoskeletal: Neck supple.  Cardiovascular:     Rate and Rhythm: Normal rate and regular rhythm.  Pulmonary:     Effort: Pulmonary effort is  normal. No respiratory distress.     Breath sounds: Normal breath sounds.  Abdominal:     Palpations: Abdomen is soft.     Tenderness: There is no abdominal tenderness.  Musculoskeletal:     Right knee: She exhibits swelling. She exhibits no ecchymosis, no deformity and normal alignment. Tenderness found.       Legs:  Skin:    General: Skin is warm and dry.     Findings: Abrasion present.          Comments: Superficial abrasions on bilateral knees, R>L.   Neurological:     Mental Status: She is alert.      UC Treatments / Results  Labs (all labs ordered are listed, but only abnormal results are displayed) Labs Reviewed - No data to display  EKG None  Radiology No results found.  Procedures Procedures (including critical care time)  Medications Ordered in UC Medications - No data to display  Initial Impression / Assessment and Plan / UC Course  I have reviewed the triage vital signs and the nursing notes.  Pertinent labs & imaging results that were available during my care of the patient were reviewed by me and considered in my medical decision making (see chart for details).   Right knee contusion s/p fall.  Treating with ibuprofen and Tylenol.    Final Clinical Impressions(s) / UC Diagnoses   Final diagnoses:  Contusion of right knee, subsequent encounter  Fall, subsequent encounter     Discharge Instructions     Your knee pain is likely caused by your fall.  Take the prescribed ibuprofen for the pain.  You can take Tylenol also.  If your knee pain persists or worsens, return here or follow up with the orthopedic listed.        ED Prescriptions    Medication Sig Dispense Auth. Provider   ibuprofen (ADVIL) 600 MG tablet Take 1 tablet (600 mg total) by mouth every 6 (six) hours as needed. 30 tablet Kristin Balloon, NP     Controlled Substance Prescriptions [employer] Controlled Substance Registry consulted? Not Applicable   Kristin Balloon, NP 01/02/19 1550

## 2019-01-02 NOTE — Discharge Instructions (Addendum)
Your knee pain is likely caused by your fall.  Take the prescribed ibuprofen for the pain.  You can take Tylenol also.  If your knee pain persists or worsens, return here or follow up with the orthopedic listed.

## 2019-01-17 ENCOUNTER — Ambulatory Visit (HOSPITAL_COMMUNITY)
Admission: EM | Admit: 2019-01-17 | Discharge: 2019-01-17 | Disposition: A | Discharge status: Home / Self Care | Payer: Medicaid Other | Attending: Urgent Care | Admitting: Urgent Care

## 2019-01-17 ENCOUNTER — Other Ambulatory Visit: Payer: Self-pay

## 2019-01-17 ENCOUNTER — Encounter (HOSPITAL_COMMUNITY): Payer: Self-pay

## 2019-01-17 DIAGNOSIS — R438 Other disturbances of smell and taste: Secondary | ICD-10-CM

## 2019-01-17 DIAGNOSIS — Z20828 Contact with and (suspected) exposure to other viral communicable diseases: Secondary | ICD-10-CM

## 2019-01-17 DIAGNOSIS — R6889 Other general symptoms and signs: Secondary | ICD-10-CM | POA: Insufficient documentation

## 2019-01-17 DIAGNOSIS — J029 Acute pharyngitis, unspecified: Secondary | ICD-10-CM

## 2019-01-17 DIAGNOSIS — R51 Headache: Secondary | ICD-10-CM

## 2019-01-17 DIAGNOSIS — Z20822 Contact with and (suspected) exposure to covid-19: Secondary | ICD-10-CM

## 2019-01-17 LAB — POCT RAPID STREP A: Streptococcus, Group A Screen (Direct): NEGATIVE

## 2019-01-17 NOTE — Discharge Instructions (Signed)
Take Tylenol as needed for discomfort.    You should self quarantine until your COVID test results come back negative.  If your workplace is unable to give you test results, call here to have a new COVID test ordered.

## 2019-01-17 NOTE — ED Provider Notes (Signed)
[facility]    CSN: [id number] Arrival date & time: 01/17/19  1038      History   Chief Complaint Chief Complaint  Patient presents with  . Sore Throat    HPI Kristin Montes is a 43 y.o. female.   Patient presents today with 4-day history of sore throat, headache, loss of taste and smell senses.  Patient states that she was tested for COVID at work on 01/11/2019 but has not received test results yet; she will call again for results today.  She denies cough, shortness of breath, fever, chills, vomiting, diarrhea, abdominal pain, dysuria.  LMP: 12/29/2018.  The history is provided by the patient.    History reviewed. No pertinent past medical history.  Patient Active Problem List   Diagnosis Date Noted  . 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 Right flank pain 03/16/2013  . Dysuria 03/16/2013    Past Surgical History:  Procedure Laterality Date  . HERNIA REPAIR    . NEPHRECTOMY    . TUBAL LIGATION      OB History   No obstetric history on file.      Home Medications    Prior to Admission medications   Medication Sig Start Date End Date Taking? Authorizing Provider  ibuprofen (ADVIL) 600 MG tablet Take 1 tablet (600 mg total) by mouth every 6 (six) hours as needed. 01/02/19   Mickie Bailate, Rigo Letts H, NP  ondansetron (ZOFRAN) 4 MG tablet Take 1 tablet (4 mg total) by mouth every 6 (six) hours. 12/16/13   Junious SilkMerrell, Hannah, PA-C  oxyCODONE-acetaminophen (PERCOCET/ROXICET) 5-325 MG per tablet Take 1 tablet by mouth every 4 (four) hours as needed for severe pain. May take 2 tablets PO q 6 hours for severe pain - Do not take with Tylenol as this tablet already contains tylenol 12/16/13   Junious SilkMerrell, Hannah, PA-C    Family History History reviewed. No pertinent family history.  Social History Social History   Tobacco Use  . Smoking status: Current Every Day Smoker    Types: Cigarettes  . Smokeless tobacco: Never Used  . Tobacco comment: 6 cigs/day  Substance Use Topics  . Alcohol  use: Yes    Comment: occasionally  . Drug use: No     Allergies   Patient has no known allergies.   Review of Systems Review of Systems  Constitutional: Negative for chills and fever.  HENT: Positive for sore throat. Negative for ear pain.   Eyes: Negative for pain and visual disturbance.  Respiratory: Negative for cough and shortness of breath.   Cardiovascular: Negative for chest pain and palpitations.  Gastrointestinal: Negative for abdominal pain, diarrhea, nausea and vomiting.  Genitourinary: Negative for dysuria and hematuria.  Musculoskeletal: Negative for arthralgias and back pain.  Skin: Negative for color change and rash.  Neurological: Positive for headaches. Negative for seizures and syncope.  All other systems reviewed and are negative.    Physical Exam Triage Vital Signs ED Triage Vitals  Enc Vitals Group     BP 01/17/19 1100 137/88     Pulse Rate 01/17/19 1100 85     Resp 01/17/19 1100 18     Temp 01/17/19 1100 99 F (37.2 C)     Temp src --      SpO2 01/17/19 1100 100 %     Weight 01/17/19 1058 150 lb (68 kg)     Height --      Head Circumference --      Peak Flow --  Pain Score 01/17/19 1058 6     Pain Loc --      Pain Edu? --      Excl. in [place]? --    No data found.  Updated Vital Signs BP 137/88 (BP Location: Left Arm)   Pulse 85   Temp 99 F (37.2 C)   Resp 18   Wt 150 lb (68 kg)   LMP 12/29/2018   SpO2 100%   BMI 24.96 kg/m   Visual Acuity Right Eye Distance:   Left Eye Distance:   Bilateral Distance:    Right Eye Near:   Left Eye Near:    Bilateral Near:     Physical Exam Vitals signs and nursing note reviewed.  Constitutional:      General: She is not in acute distress.    Appearance: She is well-developed.  HENT:     Head: Normocephalic and atraumatic.     Right Ear: Tympanic membrane normal.     Left Ear: Tympanic membrane normal.     Mouth/Throat:     Mouth: Mucous membranes are moist.     Pharynx: No  oropharyngeal exudate or posterior oropharyngeal erythema.  Eyes:     Conjunctiva/sclera: Conjunctivae normal.  Neck:     Musculoskeletal: Neck supple.  Cardiovascular:     Rate and Rhythm: Normal rate and regular rhythm.     Heart sounds: No murmur.  Pulmonary:     Effort: Pulmonary effort is normal. No respiratory distress.     Breath sounds: Normal breath sounds.  Abdominal:     Palpations: Abdomen is soft.     Tenderness: There is no abdominal tenderness.  Lymphadenopathy:     Cervical: No cervical adenopathy.  Skin:    General: Skin is warm and dry.     Findings: No rash.  Neurological:     Mental Status: She is alert.      UC Treatments / Results  Labs (all labs ordered are listed, but only abnormal results are displayed) Labs Reviewed - No data to display  EKG   Radiology No results found.  Procedures Procedures (including critical care time)  Medications Ordered in UC Medications - No data to display  Initial Impression / Assessment and Plan / UC Course  I have reviewed the triage vital signs and the nursing notes.  Pertinent labs & imaging results that were available during my care of the patient were reviewed by me and considered in my medical decision making (see chart for details).   Suspected COVID virus.  Patient has test pending from 01/11/2019 which was done at her work.  Discussed with patient that she should self quarantine until these test results are back negative.  Discussed that she should call here for new COVID test if her work is unable to provide her with the results.  Discussed that she can take Tylenol as needed for discomfort; follow-up here or with primary care provider if she develops high fever, vomiting, diarrhea, shortness of breath, cough, rash, or other symptoms.     Final Clinical Impressions(s) / UC Diagnoses   Final diagnoses:  None   Discharge Instructions   None    ED Prescriptions    None     Controlled Substance  Prescriptions [place] Controlled Substance Registry consulted? Not Applicable   Sharion Balloon, NP 01/17/19 1141

## 2019-01-17 NOTE — ED Triage Notes (Signed)
Pt states she has a sore throat and she's not able to swell anything x 4 days.

## 2019-01-19 ENCOUNTER — Telehealth (HOSPITAL_COMMUNITY): Payer: Self-pay

## 2019-01-19 LAB — CULTURE, GROUP A STREP (THRC)

## 2019-01-22 ENCOUNTER — Telehealth: Payer: Self-pay | Admitting: *Deleted

## 2019-01-22 DIAGNOSIS — Z20822 Contact with and (suspected) exposure to covid-19: Secondary | ICD-10-CM

## 2019-01-22 NOTE — Telephone Encounter (Signed)
Pt scheduled for covid testing 01/23/19 @ [facility] @ 11:30. Instructions given and order placed

## 2019-01-22 NOTE — Telephone Encounter (Signed)
-----   Message from Sharion Balloon, NP sent at 01/22/2019 10:03 AM EDT ----- Regarding: need COVID test

## 2019-01-23 ENCOUNTER — Other Ambulatory Visit: Payer: Self-pay

## 2019-01-23 DIAGNOSIS — Z20822 Contact with and (suspected) exposure to covid-19: Secondary | ICD-10-CM

## 2019-01-28 LAB — NOVEL CORONAVIRUS, NAA: SARS-CoV-2, NAA: DETECTED — AB

## 2020-09-28 DIAGNOSIS — M5020 Other cervical disc displacement, unspecified cervical region: Secondary | ICD-10-CM | POA: Insufficient documentation

## 2020-09-28 DIAGNOSIS — M1990 Unspecified osteoarthritis, unspecified site: Secondary | ICD-10-CM | POA: Insufficient documentation

## 2020-10-01 ENCOUNTER — Encounter (HOSPITAL_COMMUNITY): Payer: Self-pay | Admitting: Urgent Care

## 2020-10-01 ENCOUNTER — Ambulatory Visit (INDEPENDENT_AMBULATORY_CARE_PROVIDER_SITE_OTHER): Payer: Self-pay

## 2020-10-01 ENCOUNTER — Ambulatory Visit (HOSPITAL_COMMUNITY)
Admission: EM | Admit: 2020-10-01 | Discharge: 2020-10-01 | Disposition: A | Discharge status: Home / Self Care | Payer: Self-pay | Attending: Urgent Care | Admitting: Urgent Care

## 2020-10-01 ENCOUNTER — Other Ambulatory Visit: Payer: Self-pay

## 2020-10-01 DIAGNOSIS — M5412 Radiculopathy, cervical region: Secondary | ICD-10-CM

## 2020-10-01 DIAGNOSIS — M542 Cervicalgia: Secondary | ICD-10-CM

## 2020-10-01 DIAGNOSIS — M62838 Other muscle spasm: Secondary | ICD-10-CM

## 2020-10-01 DIAGNOSIS — M5030 Other cervical disc degeneration, unspecified cervical region: Secondary | ICD-10-CM

## 2020-10-01 MED ORDER — TIZANIDINE HCL 4 MG PO TABS: 4.0000 mg | ORAL_TABLET | Freq: Three times a day (TID) | ORAL | 0 refills | Status: DC | PRN

## 2020-10-01 MED ORDER — PREDNISONE 20 MG PO TABS: ORAL_TABLET | ORAL | 0 refills | Status: DC

## 2020-10-01 NOTE — Discharge Instructions (Signed)
Please start the prednisone (steroid) course to help with your neck pain which we are addressing for nerve pain and inflammation. You can use Tylenol at a dose of 500mg -650mg  once every 6 hours as needed together with this. Tizanidine is a muscle relaxant that may help with your muscle spasms of your neck and trapezius muscle. Hydrate well with at least 2 liters (64 ounces) of water daily. Do not use any nonsteroidal anti-inflammatories (NSAIDs) like ibuprofen, Motrin, naproxen, Aleve, etc. (which are all available over-the-counter) while taking prednisone. Follow up with the spine specialists for further imaging such as an MRI.

## 2020-10-01 NOTE — ED Triage Notes (Signed)
Pt c/o right shoulder pain. She states she is having headaches and her neck feels stiff.

## 2020-10-01 NOTE — ED Provider Notes (Signed)
[facility]   MRN: [id number] DOB: 1975/08/16  Subjective:   Kristin Montes is a 45 y.o. female presenting for 46-month history of persistent and worsening right-sided neck pain that radiates down the trapezius and upper right arm.  Patient states that it has become dramatically worse in the past 2 weeks, feels shooting pains going down and up her neck and trapezius.  Has started to cause posterior headaches.  Also feels more weakness of her right arm.  Denies falls, trauma, numbness or tingling, car accidents.  She does work at [employer] and does a lot of strenuous work activities, this includes random heavy lifting.  She has used Tylenol, ibuprofen, muscle relaxant with minimal relief.  Denies any history of musculoskeletal disorders.  She does admit that she sometimes gets relief with abducting her right arm above 90 degrees and parallel with her shoulder but this does not always help.  No current facility-administered medications for this encounter.  Current Outpatient Medications:  .  ibuprofen (ADVIL) 600 MG tablet, Take 1 tablet (600 mg total) by mouth every 6 (six) hours as needed., Disp: 30 tablet, Rfl: 0 .  ondansetron (ZOFRAN) 4 MG tablet, Take 1 tablet (4 mg total) by mouth every 6 (six) hours., Disp: 12 tablet, Rfl: 0 .  oxyCODONE-acetaminophen (PERCOCET/ROXICET) 5-325 MG per tablet, Take 1 tablet by mouth every 4 (four) hours as needed for severe pain. May take 2 tablets PO q 6 hours for severe pain - Do not take with Tylenol as this tablet already contains tylenol, Disp: 15 tablet, Rfl: 0   No Known Allergies  No past medical history on file.   Past Surgical History:  Procedure Laterality Date  . HERNIA REPAIR    . NEPHRECTOMY    . TUBAL LIGATION      No family history on file.  Social History   Tobacco Use  . Smoking status: Current Every Day Smoker    Types: Cigarettes  . Smokeless tobacco: Never Used  . Tobacco comment: 6 cigs/day  Substance Use  Topics  . Alcohol use: Yes    Comment: occasionally  . Drug use: No    ROS   Objective:   Vitals: BP (!) 129/96   Pulse 71   Temp 98.9 F (37.2 C) (Oral)   Resp 18   LMP  (LMP Unknown)   SpO2 100%   Physical Exam Constitutional:      General: She is not in acute distress.    Appearance: Normal appearance. She is well-developed. She is not ill-appearing, toxic-appearing or diaphoretic.  HENT:     Head: Normocephalic and atraumatic.     Nose: Nose normal.     Mouth/Throat:     Mouth: Mucous membranes are moist.     Pharynx: Oropharynx is clear.  Eyes:     General: No scleral icterus.       Right eye: No discharge.        Left eye: No discharge.     Extraocular Movements: Extraocular movements intact.     Conjunctiva/sclera: Conjunctivae normal.     Pupils: Pupils are equal, round, and reactive to light.  Cardiovascular:     Rate and Rhythm: Normal rate.  Pulmonary:     Effort: Pulmonary effort is normal.  Musculoskeletal:     Cervical back: Spasms (paraspinal muscles, trapezius), tenderness and bony tenderness (over spinous processes and paraspinal muscles) present. No swelling, edema, deformity, erythema, signs of trauma, lacerations, rigidity, torticollis or crepitus. Pain with  movement present. Decreased range of motion.     Comments: Strength 5/5 for upper extremities.  Negative Lhermitte sign.  Positive Spurling maneuver.  Skin:    General: Skin is warm and dry.  Neurological:     General: No focal deficit present.     Mental Status: She is alert and oriented to person, place, and time.     Cranial Nerves: No cranial nerve deficit.     Motor: No weakness.     Coordination: Coordination normal.     Gait: Gait normal.     Deep Tendon Reflexes: Reflexes normal.  Psychiatric:        Mood and Affect: Mood normal.        Behavior: Behavior normal.        Thought Content: Thought content normal.        Judgment: Judgment normal.     DG Cervical Spine  Complete  Result Date: 10/01/2020 CLINICAL DATA:  Neck pain.  Radiculopathy. EXAM: CERVICAL SPINE - COMPLETE 4+ VIEW COMPARISON:  No prior. FINDINGS: No soft tissue swelling. Loss of normal cervical lordosis. Diffuse severe multilevel degenerative change with multilevel prominent disc space loss and endplate osteophyte formation. The C1 ring may be incomplete. This may be developmental. This may be from prior injury. Corticated bony density noted along the posterior aspect the C5 spinous process. This is most likely an old fracture fragment or ligamentous ossification. Similar finding noted along the posterior aspect of the spinous process of C6. This is less obvious. Multifocal bilateral neural foraminal narrowing noted. IMPRESSION: 1. Loss of normal cervical lordosis. Diffuse severe multilevel degenerative change with multilevel prominent disc space loss and endplate osteophyte formation. Multifocal bilateral neural foraminal narrowing noted. No acute abnormality identified. 2. The C1 ring may be incomplete. This may be developmental or from prior injury. Old fracture fragments versus ligamentous ossification noted along the C5 and C6 spinous processes. If the patient has had a recent injury CT of cervical spine can be obtained to further evaluate. Electronically Signed   By: Maisie Fus  Register   On: 10/01/2020 16:54     Assessment and Plan :   PDMP not reviewed this encounter.  1. Cervical radiculopathy   2. Muscle spasms of neck   3. Trapezius muscle spasm     Start 9 day oral prednisone course. Use APAP, tizanidine, back care otherwise. Emphasized need for follow up with spine specialty clinic. Counseled patient on potential for adverse effects with medications prescribed/recommended today, ER and return-to-clinic precautions discussed, patient verbalized understanding.    Wallis Bamberg, PA-C 10/01/20 1710

## 2020-10-02 ENCOUNTER — Encounter (HOSPITAL_COMMUNITY): Payer: Self-pay | Admitting: Emergency Medicine

## 2020-10-02 ENCOUNTER — Emergency Department (HOSPITAL_COMMUNITY): Payer: Self-pay

## 2020-10-02 ENCOUNTER — Emergency Department (HOSPITAL_COMMUNITY)
Admission: EM | Admit: 2020-10-02 | Discharge: 2020-10-02 | Disposition: A | Discharge status: Home / Self Care | Payer: Self-pay | Attending: Emergency Medicine | Admitting: Emergency Medicine

## 2020-10-02 DIAGNOSIS — F17210 Nicotine dependence, cigarettes, uncomplicated: Secondary | ICD-10-CM | POA: Insufficient documentation

## 2020-10-02 DIAGNOSIS — M25511 Pain in right shoulder: Secondary | ICD-10-CM | POA: Insufficient documentation

## 2020-10-02 DIAGNOSIS — M5010 Cervical disc disorder with radiculopathy, unspecified cervical region: Secondary | ICD-10-CM | POA: Insufficient documentation

## 2020-10-02 DIAGNOSIS — M4722 Other spondylosis with radiculopathy, cervical region: Secondary | ICD-10-CM

## 2020-10-02 MED ORDER — METHOCARBAMOL 500 MG PO TABS: 500.0000 mg | ORAL_TABLET | Freq: Four times a day (QID) | ORAL | 0 refills | Status: DC | PRN

## 2020-10-02 MED ORDER — OXYCODONE-ACETAMINOPHEN 5-325 MG PO TABS: 1.0000 | ORAL_TABLET | Freq: Four times a day (QID) | ORAL | 0 refills | Status: DC | PRN

## 2020-10-02 MED ORDER — METHOCARBAMOL 500 MG PO TABS
1000.0000 mg | ORAL_TABLET | Freq: Once | ORAL | Status: AC
  Administered 2020-10-02: 1000 mg via ORAL
  Filled 2020-10-02: qty 2

## 2020-10-02 MED ORDER — METHOCARBAMOL 1000 MG/10ML IJ SOLN
1000.0000 mg | Freq: Once | INTRAMUSCULAR | Status: DC
  Filled 2020-10-02 (×3): qty 10

## 2020-10-02 MED ORDER — HYDROMORPHONE HCL 1 MG/ML IJ SOLN
1.0000 mg | Freq: Once | INTRAMUSCULAR | Status: AC
  Administered 2020-10-02: 1 mg via INTRAMUSCULAR
  Filled 2020-10-02: qty 1

## 2020-10-02 NOTE — ED Provider Notes (Signed)
Attending EDP, Arby Barrette, MD, unable to write appropriate prescriptions for this patient for pain management due to a glitch in the system.  I wrote these prescriptions in her stead.   Anselm Pancoast, PA-C 10/02/20 1632    Arby Barrette, MD 10/03/20 [phone]

## 2020-10-02 NOTE — ED Notes (Signed)
Patient transported to X-ray 

## 2020-10-02 NOTE — ED Notes (Signed)
Pt in MRI.

## 2020-10-02 NOTE — ED Provider Notes (Signed)
[facility] EMERGENCY DEPARTMENT Provider Note   CSN: [id number] Arrival date & time: 10/02/20  1026     History No chief complaint on file.   Kristin Montes is a 45 y.o. female.  HPI Patient reports at least 3 months of pain in her right shoulder and trapezius area. She reports that it got much worse this week.  She now has so much pain in the side of her neck across the top of her shoulder and radiating down to her elbow that she cannot get comfortable.  She was getting some relief by positioning the arm with some elevation.  However that is no longer working.  Pain is exacerbated by turning her head to the left.  She also feels like she is starting to get some posterior headache and neck pain.  Aching in quality.  She was seen in urgent care yesterday and started on prednisone, tizanidine and acetaminophen.  She took a 60 mg dose of prednisone yesterday.  She denies she has any shortness of breath any cough or chest pain.  No lower extremity weakness numbness or tingling.    History reviewed. No pertinent past medical history.  Patient Active Problem List   Diagnosis Date Noted  . 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 Right flank pain 03/16/2013  . Dysuria 03/16/2013    Past Surgical History:  Procedure Laterality Date  . HERNIA REPAIR    . NEPHRECTOMY    . TUBAL LIGATION       OB History   No obstetric history on file.     History reviewed. No pertinent family history.  Social History   Tobacco Use  . Smoking status: Current Every Day Smoker    Types: Cigarettes  . Smokeless tobacco: Never Used  . Tobacco comment: 6 cigs/day  Substance Use Topics  . Alcohol use: Yes    Comment: occasionally  . Drug use: No    Home Medications Prior to Admission medications   Medication Sig Start Date End Date Taking? Authorizing Provider  ibuprofen (ADVIL) 600 MG tablet Take 1 tablet (600 mg total) by mouth every 6 (six) hours as needed. 01/02/19   Mickie Bail,  NP  ondansetron (ZOFRAN) 4 MG tablet Take 1 tablet (4 mg total) by mouth every 6 (six) hours. 12/16/13   Junious Silk, PA-C  oxyCODONE-acetaminophen (PERCOCET/ROXICET) 5-325 MG per tablet Take 1 tablet by mouth every 4 (four) hours as needed for severe pain. May take 2 tablets PO q 6 hours for severe pain - Do not take with Tylenol as this tablet already contains tylenol 12/16/13   Junious Silk, PA-C  predniSONE (DELTASONE) 20 MG tablet Day 1-3: Take 3 tablets daily. Day 4-6: Take 2 tablets daily. Day 7-9: Take 1 tablet daily. Take tablets daily with breakfast. 10/01/20   Wallis Bamberg, PA-C  tiZANidine (ZANAFLEX) 4 MG tablet Take 1 tablet (4 mg total) by mouth every 8 (eight) hours as needed. 10/01/20   Wallis Bamberg, PA-C    Allergies    Patient has no known allergies.  Review of Systems   Review of Systems 10 systems reviewed negative except as per HPI Physical Exam Updated Vital Signs BP (!) 142/90   Pulse 68   Temp 98.5 F (36.9 C)   Resp 17   LMP  (LMP Unknown)   SpO2 98%   Physical Exam Constitutional:      Appearance: Normal appearance.  HENT:     Head: Normocephalic and atraumatic.  Mouth/Throat:     Pharynx: Oropharynx is clear.  Eyes:     Extraocular Movements: Extraocular movements intact.     Conjunctiva/sclera: Conjunctivae normal.  Neck:     Comments: Patient has severe tenderness to palpation along the paraspinous muscles and the spinous processes at approximately C4-5-6.  The trapezius does appear to be in spasm on the right.  This is exquisitely tender to palpation along the top margin and the scapular border.  Pain is also exacerbated by turning the patient's head to the left.  Anterior soft tissues of neck are supple without lymphadenopathy or mass. Cardiovascular:     Rate and Rhythm: Normal rate and regular rhythm.     Pulses: Normal pulses.     Heart sounds: Normal heart sounds.  Pulmonary:     Effort: Pulmonary effort is normal.     Breath sounds:  Normal breath sounds.  Abdominal:     General: There is no distension.     Palpations: Abdomen is soft.     Tenderness: There is no abdominal tenderness.  Musculoskeletal:     Comments: Patient has intact range of motion of the shoulder with pain being precipitated approximately 90 degrees of abduction, above which patient is too uncomfortable to persist and elevating the arm.  There is no appreciable swelling of the upper extremities.  They are symmetric.  They are warm they are dry.  Radial pulses are 2+.  Patient can perform a symmetric grip strength bilaterally.  Skin:    General: Skin is warm and dry.  Neurological:     General: No focal deficit present.     Mental Status: She is alert and oriented to person, place, and time.     Cranial Nerves: No cranial nerve deficit.     Sensory: No sensory deficit.     Motor: No weakness.     Coordination: Coordination normal.  Psychiatric:        Mood and Affect: Mood normal.     ED Results / Procedures / Treatments   Labs (all labs ordered are listed, but only abnormal results are displayed) Labs Reviewed - No data to display  EKG None  Radiology DG Cervical Spine Complete  Result Date: 10/01/2020 CLINICAL DATA:  Neck pain.  Radiculopathy. EXAM: CERVICAL SPINE - COMPLETE 4+ VIEW COMPARISON:  No prior. FINDINGS: No soft tissue swelling. Loss of normal cervical lordosis. Diffuse severe multilevel degenerative change with multilevel prominent disc space loss and endplate osteophyte formation. The C1 ring may be incomplete. This may be developmental. This may be from prior injury. Corticated bony density noted along the posterior aspect the C5 spinous process. This is most likely an old fracture fragment or ligamentous ossification. Similar finding noted along the posterior aspect of the spinous process of C6. This is less obvious. Multifocal bilateral neural foraminal narrowing noted. IMPRESSION: 1. Loss of normal cervical lordosis. Diffuse  severe multilevel degenerative change with multilevel prominent disc space loss and endplate osteophyte formation. Multifocal bilateral neural foraminal narrowing noted. No acute abnormality identified. 2. The C1 ring may be incomplete. This may be developmental or from prior injury. Old fracture fragments versus ligamentous ossification noted along the C5 and C6 spinous processes. If the patient has had a recent injury CT of cervical spine can be obtained to further evaluate. Electronically Signed   By: Maisie Fus  Register   On: 10/01/2020 16:54   DG Shoulder Right  Result Date: 10/02/2020 CLINICAL DATA:  Right shoulder pain. EXAM: RIGHT SHOULDER - 2+  VIEW COMPARISON:  No prior. FINDINGS: No acute bony or joint abnormality. No evidence of fracture or dislocation. No evidence of separation. IMPRESSION: No acute abnormality. Electronically Signed   By: Maisie Fus  Register   On: 10/02/2020 11:26   MR Cervical Spine Wo Contrast  Result Date: 10/02/2020 CLINICAL DATA:  Neck pain, right radiculopathy EXAM: MRI CERVICAL SPINE WITHOUT CONTRAST TECHNIQUE: Multiplanar, multisequence MR imaging of the cervical spine was performed. No intravenous contrast was administered. COMPARISON:  None. FINDINGS: Motion artifact is present. Alignment: Trace retrolisthesis at C4-C5. Vertebrae: Mild degenerative endplate irregularity and marrow changes. No suspicious osseous lesion. Cord: No abnormal signal. Posterior Fossa, vertebral arteries, paraspinal tissues: Subcentimeter right thyroid nodule for which no ultrasound follow-up is recommended by current guidelines. Otherwise unremarkable. Disc levels: C2-C3:  No canal or foraminal stenosis. C3-C4: Disc bulge with endplate osteophytic ridging and uncovertebral hypertrophy. No canal or foraminal stenosis. C4-C5: Disc bulge with endplate osteophytic ridging and uncovertebral hypertrophy. Mild canal stenosis. Mild right and marked left foraminal stenosis. C5-C6: Disc bulge with endplate  osteophytic ridging and uncovertebral hypertrophy. No canal stenosis. Marked foraminal stenosis. C6-C7: Disc bulge with endplate osteophytic ridging and uncovertebral hypertrophy. No canal stenosis. Marked foraminal stenosis. C7-T1:  No canal or foraminal stenosis. IMPRESSION: Multilevel degenerative changes as detailed above. There is no high-grade canal stenosis. Foraminal narrowing is greatest from C4-C5 through C6-C7. Electronically Signed   By: Guadlupe Spanish M.D.   On: 10/02/2020 12:58    Procedures Procedures   Medications Ordered in ED Medications  HYDROmorphone (DILAUDID) injection 1 mg (1 mg Intramuscular Given 10/02/20 1118)  methocarbamol (ROBAXIN) tablet 1,000 mg (1,000 mg Oral Given 10/02/20 1348)    ED Course  I have reviewed the triage vital signs and the nursing notes.  Pertinent labs & imaging results that were available during my care of the patient were reviewed by me and considered in my medical decision making (see chart for details).    MDM Rules/Calculators/A&P                         Patient presents with escalating pain in her neck and right shoulder.  She has radiculopathy to the elbow.  Limited range of motion.  Very tender to palpation from the midline of the C-spine to the right shoulder.  Patient had plain film x-rays done yesterday which show extensive changes of degenerative disease and possible disc space narrowing.  We will proceed with MRI to evaluate for significant or critical nerve impingement.  For pain, will treat with IM dose of Dilaudid and Robaxin.  Patient has been started on prednisone 60 mg yesterday.  Patient has cervical radiculopathy to her elbow.  MRI shows extensive degenerative joint disease.  However, no critical impingements or identified.  With patient neurovascularly intact, will have her continue with her prednisone, muscle relaxer and Percocet as needed for short course of acute pain.  He is counseled on necessity to follow-up with   neurologic and spine specialists for evaluation of ongoing chronic management of her condition.  She is counseled this may include injections, physical therapy and other modalities of pain control. Final Clinical Impression(s) / ED Diagnoses Final diagnoses:  Cervical radiculopathy due to degenerative joint disease of spine    Rx / DC Orders ED Discharge Orders    None       Arby Barrette, MD 10/02/20 1441

## 2020-10-02 NOTE — Discharge Instructions (Signed)
1.  You have a lot of arthritis in your neck.  This is causing pressure on nerves and muscle spasm.  You must follow-up with the spine specialist. 2.  Continue the prednisone course that was prescribed to yesterday.  I have changed your muscle relaxer to Robaxin.  Do not take the Zanaflex prescribed earlier.  I have also prescribed a short course of Percocet for additional pain control.  Percocet is an addictive medication.  As soon as your pain is adequately controlled with the steroids and muscle relaxers, you may change to extra strength Tylenol every 6 hours for additional pain control.  Do not take Tylenol and Percocet together, they both contain acetaminophen.  You might end up taking over the recommended dose of acetaminophen you combine these 2 medications 3.  Return to the emergency department if you get numbness or weakness of your arm, if your pain is intolerable despite medications or other concerning symptoms develop.

## 2020-10-02 NOTE — ED Triage Notes (Signed)
Pt here with c/o pain in her right shoulder , pt has x ray of her neck yesterday and started  on steroids from UC

## 2020-10-14 ENCOUNTER — Emergency Department (HOSPITAL_COMMUNITY): Payer: Self-pay

## 2020-10-14 ENCOUNTER — Encounter (HOSPITAL_COMMUNITY): Payer: Self-pay

## 2020-10-14 ENCOUNTER — Emergency Department (HOSPITAL_COMMUNITY)
Admission: EM | Admit: 2020-10-14 | Discharge: 2020-10-14 | Disposition: A | Discharge status: Home / Self Care | Payer: Medicaid Other | Attending: Emergency Medicine | Admitting: Emergency Medicine

## 2020-10-14 ENCOUNTER — Other Ambulatory Visit: Payer: Self-pay

## 2020-10-14 DIAGNOSIS — M25511 Pain in right shoulder: Secondary | ICD-10-CM | POA: Insufficient documentation

## 2020-10-14 DIAGNOSIS — R1084 Generalized abdominal pain: Secondary | ICD-10-CM | POA: Insufficient documentation

## 2020-10-14 DIAGNOSIS — M546 Pain in thoracic spine: Secondary | ICD-10-CM

## 2020-10-14 DIAGNOSIS — F17210 Nicotine dependence, cigarettes, uncomplicated: Secondary | ICD-10-CM | POA: Insufficient documentation

## 2020-10-14 LAB — URINALYSIS, ROUTINE W REFLEX MICROSCOPIC
Bilirubin Urine: NEGATIVE
Glucose, UA: NEGATIVE mg/dL
Hgb urine dipstick: NEGATIVE
Ketones, ur: 5 mg/dL — AB
Leukocytes,Ua: NEGATIVE
Nitrite: NEGATIVE
Protein, ur: NEGATIVE mg/dL
Specific Gravity, Urine: 1.017 (ref 1.005–1.030)
pH: 6 (ref 5.0–8.0)

## 2020-10-14 LAB — CBC WITH DIFFERENTIAL/PLATELET
Abs Immature Granulocytes: 0.03 10*3/uL (ref 0.00–0.07)
Basophils Absolute: 0 10*3/uL (ref 0.0–0.1)
Basophils Relative: 1 %
Eosinophils Absolute: 0.1 10*3/uL (ref 0.0–0.5)
Eosinophils Relative: 1 %
HCT: 46.1 % — ABNORMAL HIGH (ref 36.0–46.0)
Hemoglobin: 15.4 g/dL — ABNORMAL HIGH (ref 12.0–15.0)
Immature Granulocytes: 1 %
Lymphocytes Relative: 9 %
Lymphs Abs: 0.6 10*3/uL — ABNORMAL LOW (ref 0.7–4.0)
MCH: 32 pg (ref 26.0–34.0)
MCHC: 33.4 g/dL (ref 30.0–36.0)
MCV: 95.8 fL (ref 80.0–100.0)
Monocytes Absolute: 0.5 10*3/uL (ref 0.1–1.0)
Monocytes Relative: 8 %
Neutro Abs: 5.2 10*3/uL (ref 1.7–7.7)
Neutrophils Relative %: 80 %
Platelets: 178 10*3/uL (ref 150–400)
RBC: 4.81 MIL/uL (ref 3.87–5.11)
RDW: 14.1 % (ref 11.5–15.5)
WBC: 6.4 10*3/uL (ref 4.0–10.5)
nRBC: 0 % (ref 0.0–0.2)

## 2020-10-14 LAB — COMPREHENSIVE METABOLIC PANEL
ALT: 27 U/L (ref 0–44)
AST: 18 U/L (ref 15–41)
Albumin: 3.9 g/dL (ref 3.5–5.0)
Alkaline Phosphatase: 83 U/L (ref 38–126)
Anion gap: 6 (ref 5–15)
BUN: 15 mg/dL (ref 6–20)
CO2: 27 mmol/L (ref 22–32)
Calcium: 8.9 mg/dL (ref 8.9–10.3)
Chloride: 104 mmol/L (ref 98–111)
Creatinine, Ser: 0.92 mg/dL (ref 0.44–1.00)
GFR, Estimated: >60 mL/min (ref >60)
Glucose, Bld: 95 mg/dL (ref 70–99)
Potassium: 4.3 mmol/L (ref 3.5–5.1)
Sodium: 137 mmol/L (ref 135–145)
Total Bilirubin: 0.8 mg/dL (ref 0.3–1.2)
Total Protein: 7.2 g/dL (ref 6.5–8.1)

## 2020-10-14 LAB — LIPASE, BLOOD: Lipase: 37 U/L (ref 11–51)

## 2020-10-14 LAB — I-STAT BETA HCG BLOOD, ED (MC, WL, AP ONLY): I-stat hCG, quantitative: <5 m[IU]/mL (ref <5)

## 2020-10-14 LAB — TROPONIN I (HIGH SENSITIVITY): Troponin I (High Sensitivity): 5 ng/L (ref <18)

## 2020-10-14 MED ORDER — ONDANSETRON HCL 4 MG/2ML IJ SOLN
4.0000 mg | Freq: Once | INTRAMUSCULAR | Status: AC
  Administered 2020-10-14: 4 mg via INTRAVENOUS
  Filled 2020-10-14: qty 2

## 2020-10-14 MED ORDER — LIDOCAINE 5 % EX PTCH: 1.0000 | MEDICATED_PATCH | CUTANEOUS | 0 refills | Status: DC

## 2020-10-14 MED ORDER — MORPHINE SULFATE (PF) 4 MG/ML IV SOLN
4.0000 mg | Freq: Once | INTRAVENOUS | Status: AC
  Administered 2020-10-14: 4 mg via INTRAVENOUS
  Filled 2020-10-14: qty 1

## 2020-10-14 MED ORDER — CYCLOBENZAPRINE HCL 10 MG PO TABS: 10.0000 mg | ORAL_TABLET | Freq: Two times a day (BID) | ORAL | 0 refills | Status: DC | PRN

## 2020-10-14 NOTE — ED Notes (Signed)
Patient transported to X-ray 

## 2020-10-14 NOTE — ED Triage Notes (Signed)
Pt c/o neck and shoulder pain. Was seen at [facility] 2 wks ago and had MRI- dx w severe arthritis & cervical rediculopathy. Took steroids w/o relief, advised to come back to ED if pain persists

## 2020-10-14 NOTE — ED Triage Notes (Signed)
Emergency Medicine Provider Triage Evaluation Note  Shellene Sweigert , a 45 y.o. female  was evaluated in triage.  Pt complains of right sided neck and shoulder pain.  [facility], had [facility], diagnosed with arthritis, took meds but still hurting    Physical Exam  BP 134/89   Pulse 95   Temp 99.2 F (37.3 C) (Oral)   Resp 16   Ht 5\' 4"  (1.626 m)   Wt 70 kg   LMP 09/14/2019   SpO2 99%   BMI 26.49 kg/m  Patient awake and alert in no distress.  Respirations are even and unlabored.   Pain with palpation over right sided posterior neck and shoulder  Medical Decision Making  Medically screening exam initiated at 12:18 PM.  Appropriate orders placed.  Mittie Knittel was informed that the remainder of the evaluation will be completed by another provider, this initial triage assessment does not replace that evaluation, and the importance of remaining in the ED until their evaluation is complete.   [name], PA-C 10/14/20 1219

## 2020-10-14 NOTE — ED Provider Notes (Signed)
[facility]-EMERGENCY DEPT Provider Note   CSN: [id number] Arrival date & time: 10/14/20  1144     History Chief Complaint  Patient presents with  . Neck Pain    Kristin Montes is a 45 y.o. female.  The history is provided by the patient and medical records.  Neck Pain  Kristin Montes is a 45 y.o. female who presents to the Emergency Department complaining of shoulder pain. She presents the emergency department for evaluation of three weeks of progressive right sided shoulder pain. Pain is located in the shoulder and scapula and radiates to the right anterior chest. She describes it as a constant aching to think type sensation. No significant change with movement. She has associated occasional tingling in the digits of the right hand. Yesterday she developed nausea, vomiting and epigastric discomfort as well. She reports chills but no fevers. Denies diarrhea, constipation, dysuria. No recent injuries. She was evaluated in the emergency department on March 23 and had an MRI performed. She was compliant with prescribed medications of prednisone, Robaxin and Percocet. She felt no significant relief with his medications. She is right-hand dominant. She has a history of left-sided nephrectomy due to kidney stones in her 59s. She takes no additional prescription medications. She smokes cigarettes. No alcohol. No IV drug use.    History reviewed. No pertinent past medical history.  Patient Active Problem List   Diagnosis Date Noted  . 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 Right flank pain 03/16/2013  . Dysuria 03/16/2013    Past Surgical History:  Procedure Laterality Date  . HERNIA REPAIR    . NEPHRECTOMY    . TUBAL LIGATION       OB History   No obstetric history on file.     No family history on file.  Social History   Tobacco Use  . Smoking status: Current Every Day Smoker    Types: Cigarettes  . Smokeless tobacco: Never Used  . Tobacco comment: 6 cigs/day   Substance Use Topics  . Alcohol use: Not Currently    Comment: occasionally  . Drug use: Yes    Types: Marijuana    Home Medications Prior to Admission medications   Medication Sig Start Date End Date Taking? Authorizing Provider  cyclobenzaprine (FLEXERIL) 10 MG tablet Take 1 tablet (10 mg total) by mouth 2 (two) times daily as needed for muscle spasms. 10/14/20  Yes Tilden Fossa, MD  lidocaine (LIDODERM) 5 % Place 1 patch onto the skin daily. Remove & Discard patch within 12 hours or as directed by MD 10/14/20  Yes Tilden Fossa, MD  naproxen sodium (ALEVE) 220 MG tablet Take 440-660 mg by mouth 2 (two) times daily as needed (headache/pain).   Yes [provider]  oxyCODONE-acetaminophen (PERCOCET/ROXICET) 5-325 MG tablet Take 1 tablet by mouth every 6 (six) hours as needed for severe pain. Patient not taking: Reported on 10/14/2020 10/02/20   Joy, Ines Bloomer C, PA-C  predniSONE (DELTASONE) 20 MG tablet Day 1-3: Take 3 tablets daily. Day 4-6: Take 2 tablets daily. Day 7-9: Take 1 tablet daily. Take tablets daily with breakfast. 10/01/20   Wallis Bamberg, PA-C    Allergies    Patient has no known allergies.  Review of Systems   Review of Systems  Musculoskeletal: Positive for neck pain.  All other systems reviewed and are negative.   Physical Exam Updated Vital Signs BP 138/83   Pulse 61   Temp 99.2 F (37.3 C) (Oral)   Resp 16  Ht 5\' 4"  (1.626 m)   Wt 70 kg   LMP 09/14/2019   SpO2 100%   BMI 26.49 kg/m   Physical Exam Vitals and nursing note reviewed.  Constitutional:      Appearance: She is well-developed.  HENT:     Head: Normocephalic and atraumatic.  Cardiovascular:     Rate and Rhythm: Normal rate and regular rhythm.     Heart sounds: No murmur heard.   Pulmonary:     Effort: Pulmonary effort is normal. No respiratory distress.     Breath sounds: Normal breath sounds.  Abdominal:     Palpations: Abdomen is soft.     Tenderness: There is no guarding or  rebound.     Comments: Mild generalized abdominal tenderness  Musculoskeletal:     Comments: 2+ radial pulses bilaterally. There is mild tenderness to palpation over the right trapezius. Range of motion is intact in the right shoulder.  Skin:    General: Skin is warm and dry.  Neurological:     Mental Status: She is alert and oriented to person, place, and time.     Comments: Five out of five strength in all four extremities with sensation light touch intact in all four extremities  Psychiatric:        Behavior: Behavior normal.     ED Results / Procedures / Treatments   Labs (all labs ordered are listed, but only abnormal results are displayed) Labs Reviewed  CBC WITH DIFFERENTIAL/PLATELET - Abnormal; Notable for the following components:      Result Value   Hemoglobin 15.4 (*)    HCT 46.1 (*)    Lymphs Abs 0.6 (*)    All other components within normal limits  URINALYSIS, ROUTINE W REFLEX MICROSCOPIC - Abnormal; Notable for the following components:   Ketones, ur 5 (*)    All other components within normal limits  COMPREHENSIVE METABOLIC PANEL  LIPASE, BLOOD  I-STAT BETA HCG BLOOD, ED ([facility], [facility], [facility] ONLY)  TROPONIN I (HIGH SENSITIVITY)  TROPONIN I (HIGH SENSITIVITY)    EKG EKG Interpretation  Date/Time:  Tuesday October 14 2020 16:08:27 EDT Ventricular Rate:  68 PR Interval:  127 QRS Duration: 90 QT Interval:  405 QTC Calculation: 431 R Axis:   58 Text Interpretation: Sinus rhythm Consider left ventricular hypertrophy Confirmed by [id number]) on 10/14/2020 4:30:22 PM   Radiology DG Chest 2 View  Result Date: 10/14/2020 CLINICAL DATA:  Chest and right shoulder pain EXAM: CHEST - 2 VIEW COMPARISON:  None. FINDINGS: The heart size and mediastinal contours are within normal limits. Both lungs are clear. The visualized skeletal structures are unremarkable. IMPRESSION: No active cardiopulmonary disease. Electronically Signed   By: 12/14/2020 M.D.   On: 10/14/2020  16:04    Procedures Procedures   Medications Ordered in ED Medications  morphine 4 MG/ML injection 4 mg (4 mg Intravenous Given 10/14/20 1605)  ondansetron (ZOFRAN) injection 4 mg (4 mg Intravenous Given 10/14/20 1604)    ED Course  I have reviewed the triage vital signs and the nursing notes.  Pertinent labs & imaging results that were available during my care of the patient were reviewed by me and considered in my medical decision making (see chart for details).    MDM Rules/Calculators/A&P                          patient here for evaluation of several weeks of pain to the right  trapezius region. She does have paresthesias to the digits of the right hand but is neurologically and vascular early intact on examination. She recently had an MRI performed for similar symptoms. Given new onset vomiting today labs obtained, which are reassuring. Current presentation is not consistent with septic arthritis, cauda equina, intrathoracic or intra-abdominal infection. Discussed with patient unclear source of symptoms. Recommend PCP follow-up and symptomatic treatment at home. Return precautions discussed.  Final Clinical Impression(s) / ED Diagnoses Final diagnoses:  Acute right-sided thoracic back pain    Rx / DC Orders ED Discharge Orders         Ordered    lidocaine (LIDODERM) 5 %  Every 24 hours        10/14/20 1735    cyclobenzaprine (FLEXERIL) 10 MG tablet  2 times daily PRN        10/14/20 1735           Tilden Fossa, MD 10/14/20 2009

## 2020-10-14 NOTE — Discharge Instructions (Signed)
The cause of your pain was not identified today. Please follow up with your family doctor for further evaluation.  You can take ibuprofen, available over the counter according to label instructions as needed for pain.

## 2020-11-17 ENCOUNTER — Ambulatory Visit (HOSPITAL_COMMUNITY)
Admission: EM | Admit: 2020-11-17 | Discharge: 2020-11-17 | Disposition: A | Discharge status: Home / Self Care | Payer: Medicaid Other | Attending: Emergency Medicine | Admitting: Emergency Medicine

## 2020-11-17 ENCOUNTER — Other Ambulatory Visit: Payer: Self-pay

## 2020-11-17 ENCOUNTER — Encounter (HOSPITAL_COMMUNITY): Payer: Self-pay | Admitting: *Deleted

## 2020-11-17 DIAGNOSIS — M542 Cervicalgia: Secondary | ICD-10-CM

## 2020-11-17 DIAGNOSIS — G8929 Other chronic pain: Secondary | ICD-10-CM

## 2020-11-17 MED ORDER — PREDNISONE 20 MG PO TABS: ORAL_TABLET | ORAL | 0 refills | Status: DC

## 2020-11-17 MED ORDER — CYCLOBENZAPRINE HCL 10 MG PO TABS: 10.0000 mg | ORAL_TABLET | Freq: Two times a day (BID) | ORAL | 0 refills | Status: DC | PRN

## 2020-11-17 NOTE — ED Provider Notes (Signed)
[facility]    CSN: [id number] Arrival date & time: 11/17/20  0950      History   Chief Complaint Chief Complaint  Patient presents with  . Neck Pain    HPI Kristin Montes is a 45 y.o. female.   Chronic neck pain has been seen several times in the past few months for this. Had an MRI completed also . States that she had muscle relaxer until yesterday. Went back to work 3 weeks ago and it has become worse since then. She works at [employer] and has a lot of movement that makes it worse. Does have an appoint on may 16 for follow up for the neck. States no other change or injury. Is asking for refill on muscle relaxer and steroids since those seemed to help the most.      History reviewed. No pertinent past medical history.  Patient Active Problem List   Diagnosis Date Noted  . 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 Right flank pain 03/16/2013  . Dysuria 03/16/2013    Past Surgical History:  Procedure Laterality Date  . HERNIA REPAIR    . NEPHRECTOMY    . TUBAL LIGATION      OB History   No obstetric history on file.      Home Medications    Prior to Admission medications   Medication Sig Start Date End Date Taking? Authorizing Provider  cyclobenzaprine (FLEXERIL) 10 MG tablet Take 1 tablet (10 mg total) by mouth 2 (two) times daily as needed for muscle spasms. 11/17/20   Coralyn Mark, NP  lidocaine (LIDODERM) 5 % Place 1 patch onto the skin daily. Remove & Discard patch within 12 hours or as directed by MD 10/14/20   Tilden Fossa, MD  naproxen sodium (ALEVE) 220 MG tablet Take 440-660 mg by mouth 2 (two) times daily as needed (headache/pain).    [provider]  oxyCODONE-acetaminophen (PERCOCET/ROXICET) 5-325 MG tablet Take 1 tablet by mouth every 6 (six) hours as needed for severe pain. Patient not taking: Reported on 10/14/2020 10/02/20   Joy, Ines Bloomer C, PA-C  predniSONE (DELTASONE) 20 MG tablet Day 1-3: Take 3 tablets daily. Day 4-6: Take 2 tablets  daily. Day 7-9: Take 1 tablet daily. Take tablets daily with breakfast. 11/17/20   Coralyn Mark, NP    Family History History reviewed. No pertinent family history.  Social History Social History   Tobacco Use  . Smoking status: Current Every Day Smoker    Types: Cigarettes  . Smokeless tobacco: Never Used  . Tobacco comment: 6 cigs/day  Substance Use Topics  . Alcohol use: Not Currently    Comment: occasionally  . Drug use: Yes    Types: Marijuana     Allergies   Patient has no known allergies.   Review of Systems Review of Systems  Constitutional: Negative.   Eyes: Negative.   Respiratory: Negative.   Cardiovascular: Negative.   Gastrointestinal: Negative.   Musculoskeletal: Positive for neck pain.       Chronic neck pain upper neck not lower. radates to rt side shoulder  intermit   Skin: Negative.   Neurological: Positive for numbness.       Some intrmit tingling and numbness to fingers at times, not new tho has had this in the past.      Physical Exam Triage Vital Signs ED Triage Vitals  Enc Vitals Group     BP      Pulse      Resp  Temp      Temp src      SpO2      Weight      Height      Head Circumference      Peak Flow      Pain Score      Pain Loc      Pain Edu?      Excl. in GC?    No data found.  Updated Vital Signs BP (!) 143/98 (BP Location: Right Arm)   Pulse 74   Temp 98.3 F (36.8 C) (Oral)   Resp 16   LMP 11/18/2019   SpO2 99%   Visual Acuity     Physical Exam Constitutional:      Appearance: Normal appearance.  Neck:     Comments: Cervical tenderness Full ROM pain with lateral to right side. Full motion to upper extremities equal bil grip strength . No lower back pain .  Cardiovascular:     Rate and Rhythm: Normal rate.  Pulmonary:     Effort: Pulmonary effort is normal.  Musculoskeletal:        General: Tenderness present.     Cervical back: Tenderness present.  Skin:    General: Skin is warm.      Capillary Refill: Capillary refill takes less than 2 seconds.  Neurological:     General: No focal deficit present.     Mental Status: She is alert.      UC Treatments / Results  Labs (all labs ordered are listed, but only abnormal results are displayed) Labs Reviewed - No data to display  EKG   Radiology No results found.  Procedures Procedures (including critical care time)  Medications Ordered in UC Medications - No data to display  Initial Impression / Assessment and Plan / UC Course  I have reviewed the triage vital signs and the nursing notes.  Pertinent labs & imaging results that were available during my care of the patient were reviewed by me and considered in my medical decision making (see chart for details).    Spoke with MD Lamptey about treatment plan does not recommend giving narcotics pt will need to see a pain clinic or ortho for any further narcotic pain medications Reviewed previous charts and MRI results  Pt understands treatment plan. No further images will be needed    Final Clinical Impressions(s) / UC Diagnoses   Final diagnoses:  Chronic neck pain     Discharge Instructions     You will need to see pain management or ortho for further pain medication and treatment.  Try taking NSAIDS or tylenol as needed  Cautious with taking bc powder and NSAIDS Use a heating pad to help with pain       ED Prescriptions    Medication Sig Dispense Auth. Provider   cyclobenzaprine (FLEXERIL) 10 MG tablet Take 1 tablet (10 mg total) by mouth 2 (two) times daily as needed for muscle spasms. 12 tablet Maple Mirza L, NP   predniSONE (DELTASONE) 20 MG tablet Day 1-3: Take 3 tablets daily. Day 4-6: Take 2 tablets daily. Day 7-9: Take 1 tablet daily. Take tablets daily with breakfast. 18 tablet Coralyn Mark, NP     PDMP not reviewed this encounter.   Coralyn Mark, NP 11/17/20 1135

## 2020-11-17 NOTE — Discharge Instructions (Addendum)
You will need to see pain management or ortho for further pain medication and treatment.  Try taking NSAIDS or tylenol as needed  Cautious with taking bc powder and NSAIDS Use a heating pad to help with pain

## 2020-11-17 NOTE — ED Triage Notes (Signed)
Pt reports ongoing neck pain that has been unrelieved with previous meds.

## 2020-11-24 ENCOUNTER — Ambulatory Visit: Payer: Medicaid Other | Admitting: Family

## 2020-12-02 ENCOUNTER — Other Ambulatory Visit: Payer: Self-pay

## 2020-12-02 ENCOUNTER — Telehealth (INDEPENDENT_AMBULATORY_CARE_PROVIDER_SITE_OTHER): Payer: Self-pay | Admitting: Internal Medicine

## 2020-12-02 ENCOUNTER — Encounter: Payer: Self-pay | Admitting: Internal Medicine

## 2020-12-02 DIAGNOSIS — M542 Cervicalgia: Secondary | ICD-10-CM

## 2020-12-02 DIAGNOSIS — Z7689 Persons encountering health services in other specified circumstances: Secondary | ICD-10-CM

## 2020-12-02 DIAGNOSIS — G8929 Other chronic pain: Secondary | ICD-10-CM

## 2020-12-02 MED ORDER — TRAMADOL HCL 50 MG PO TABS: 50.0000 mg | ORAL_TABLET | Freq: Two times a day (BID) | ORAL | 0 refills | Status: AC | PRN

## 2020-12-02 MED ORDER — LIDOCAINE 5 % EX PTCH: 1.0000 | MEDICATED_PATCH | CUTANEOUS | 0 refills | Status: DC

## 2020-12-02 NOTE — Progress Notes (Signed)
Establish care   C/o headache Continuous pain, generalized, 7/10  Going to ED for unknown dx.  Tried OTC medication and Medication that ED prescribed.   Feels tired, fatigue everyday

## 2020-12-02 NOTE — Progress Notes (Signed)
Virtual Visit via Telephone Note  I connected with Kristin Montes, on 12/02/2020 at 2:48 PM by telephone due to the COVID-19 pandemic and verified that I am speaking with the correct person using two identifiers.   Consent: I discussed the limitations, risks, security and privacy concerns of performing an evaluation and management service by telephone and the availability of in person appointments. I also discussed with the patient that there may be a patient responsible charge related to this service. The patient expressed understanding and agreed to proceed.   Location of Patient: Home   Location of Provider: Clinic    Persons participating in Telemedicine visit: Bambie Pizzolato [facility] Dr. Earlene Plater    History of Present Illness: Patient has a visit to establish care. No significant PMH. Not taking any medications right now. History of left nephrectomy, umbilical hernia repair, and BTL.   Patient reports that she has chronic headaches at the base of her head that she thinks its related to her chronic neck pain. She has been seen at the ER multiple times. She was just enrolled into Medicaid but it wont be active until July.    No past medical history on file. No Known Allergies  Current Outpatient Medications on File Prior to Visit  Medication Sig Dispense Refill  . oxyCODONE-acetaminophen (PERCOCET/ROXICET) 5-325 MG tablet Take 1 tablet by mouth every 6 (six) hours as needed for severe pain. (Patient not taking: No sig reported) 20 tablet 0   No current facility-administered medications on file prior to visit.    Observations/Objective: NAD. Speaking clearly.  Work of breathing normal.  Alert and oriented. Mood appropriate.   Assessment and Plan: 1. Encounter to establish care Reviewed patient's PMH, social history, surgical history, and medications.  Is overdue for annual exam, screening blood work, and health maintenance topics. Have asked patient to return for  visit to address these items. Prefers to return once has insurance in place.   2. Chronic neck pain Patient with chronic neck pain with MRI March 2022 showing multilevel degenerative changes. Will refer to ortho to schedule appointment for once she has insurance. Discussed can not refill Oxycodone, but will provide one time fill of Tramadol. Will also refill Lidocaine patches. Continue other supportive care measures.  - Ambulatory referral to Orthopedic Surgery - traMADol (ULTRAM) 50 MG tablet; Take 1 tablet (50 mg total) by mouth every 12 (twelve) hours as needed for up to 15 days.  Dispense: 30 tablet; Refill: 0 - lidocaine (LIDODERM) 5 %; Place 1 patch onto the skin daily. Remove & Discard patch within 12 hours or as directed by MD  Dispense: 15 patch; Refill: 0   Follow Up Instructions: Ortho Referral; Annual exam    I discussed the assessment and treatment plan with the patient. The patient was provided an opportunity to ask questions and all were answered. The patient agreed with the plan and demonstrated an understanding of the instructions.   The patient was advised to call back or seek an in-person evaluation if the symptoms worsen or if the condition fails to improve as anticipated.     I provided 8 minutes total of non-face-to-face time during this encounter including median intraservice time, reviewing previous notes, investigations, ordering medications, medical decision making, coordinating care and patient verbalized understanding at the end of the visit.    Marcy Siren, D.O. Primary Care at [facility]  12/02/2020, 2:48 PM

## 2020-12-11 ENCOUNTER — Ambulatory Visit: Payer: Medicaid Other | Admitting: Internal Medicine

## 2020-12-11 ENCOUNTER — Telehealth: Payer: Self-pay | Admitting: Internal Medicine

## 2020-12-11 NOTE — Telephone Encounter (Signed)
Just giving a pt update, Pt called stating she now has medicaid coverage and would like referral to orhopedic. Seen previous referral was closed when pt stated she had no insurance and states she does have insurance now [employer])

## 2020-12-12 ENCOUNTER — Other Ambulatory Visit: Payer: Self-pay | Admitting: Internal Medicine

## 2020-12-12 DIAGNOSIS — G8929 Other chronic pain: Secondary | ICD-10-CM

## 2020-12-12 NOTE — Telephone Encounter (Signed)
Have placed new referral order.   Marcy Siren, D.O. Primary Care at [facility]  12/12/2020, 8:51 AM

## 2020-12-15 ENCOUNTER — Other Ambulatory Visit: Payer: Self-pay

## 2020-12-15 ENCOUNTER — Encounter: Payer: Self-pay | Admitting: Internal Medicine

## 2020-12-15 ENCOUNTER — Other Ambulatory Visit (HOSPITAL_COMMUNITY)
Admission: RE | Admit: 2020-12-15 | Discharge: 2020-12-15 | Disposition: A | Discharge status: Home / Self Care | Payer: Medicaid Other | Source: Ambulatory Visit | Attending: Internal Medicine | Admitting: Internal Medicine

## 2020-12-15 ENCOUNTER — Ambulatory Visit (INDEPENDENT_AMBULATORY_CARE_PROVIDER_SITE_OTHER): Payer: Self-pay | Admitting: Internal Medicine

## 2020-12-15 VITALS — BP 134/89 | HR 103 | Temp 98.3°F | Resp 16 | Wt 152.0 lb

## 2020-12-15 DIAGNOSIS — Z01419 Encounter for gynecological examination (general) (routine) without abnormal findings: Secondary | ICD-10-CM | POA: Diagnosis not present

## 2020-12-15 DIAGNOSIS — M542 Cervicalgia: Secondary | ICD-10-CM

## 2020-12-15 DIAGNOSIS — N760 Acute vaginitis: Secondary | ICD-10-CM | POA: Diagnosis not present

## 2020-12-15 DIAGNOSIS — Z113 Encounter for screening for infections with a predominantly sexual mode of transmission: Secondary | ICD-10-CM | POA: Diagnosis present

## 2020-12-15 DIAGNOSIS — B9689 Other specified bacterial agents as the cause of diseases classified elsewhere: Secondary | ICD-10-CM | POA: Insufficient documentation

## 2020-12-15 DIAGNOSIS — A5901 Trichomonal vulvovaginitis: Secondary | ICD-10-CM | POA: Insufficient documentation

## 2020-12-15 DIAGNOSIS — Z124 Encounter for screening for malignant neoplasm of cervix: Secondary | ICD-10-CM

## 2020-12-15 DIAGNOSIS — G8929 Other chronic pain: Secondary | ICD-10-CM

## 2020-12-15 NOTE — Progress Notes (Signed)
PAP  headache meds Forms for work

## 2020-12-15 NOTE — Patient Instructions (Signed)
For hot flashes, I would recommend she start with black cohosh otc and then if no improvement trial prescription

## 2020-12-15 NOTE — Progress Notes (Signed)
  Subjective:    Kristin Montes - 45 y.o. female MRN [id number]  Date of birth: 30-Dec-1975  HPI  Kristin Montes is here for follow up. She needs cervical cancer screening. Has chronic neck pain    Health Maintenance:  Health Maintenance Due  Topic Date Due  . COVID-19 Vaccine (1) Never done  . Pneumococcal Vaccine 3-64 Years old (1 of 2 - PPSV23) Never done  . HIV Screening  Never done  . Hepatitis C Screening  Never done  . TETANUS/TDAP  Never done  . PAP SMEAR-Modifier  Never done    -  reports that she has been smoking cigarettes. She has never used smokeless tobacco. - Review of Systems: Per HPI. - Past Medical History: Patient Active Problem List   Diagnosis Date Noted  . 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    Appearance: She is not diaphoretic.  Cardiovascular:     Rate and Rhythm: Normal rate.  Pulmonary:     Effort: Pulmonary effort is normal. No respiratory distress.  Genitourinary:    Comments: GU/GYN: Exam performed in the presence of a chaperone. External genitalia within normal limits.  Vaginal mucosa pink, moist, normal rugae.  Nonfriable cervix without lesions, no discharge or bleeding noted on speculum exam.  Bimanual exam revealed normal, nongravid uterus.  No cervical motion tenderness. No adnexal masses bilaterally.    Musculoskeletal:        General: Normal range of motion.  Skin:    General: Skin is warm and dry.  Neurological:     Mental Status: She is alert and oriented to person, place, and time.  Psychiatric:        Mood and Affect: Affect normal.        Judgment: Judgment normal.            Assessment & Plan:   1. Screening for cervical cancer - Cytology - PAP([place])  2.  Screening for STD (sexually transmitted disease) - Cervicovaginal ancillary only  3. Chronic neck pain She has had referral to orthopedics placed due to chronic neck pain with MRI March 2022 showing multilevel degenerative changes. She also asks for referral to pain clinic as believes her pain warrants chronic narcotic use. Will place referral. Again discussed this clinic can not provide these type of medications.  - Ambulatory referral to Pain Clinic   Marcy Siren, D.O. 12/15/2020, 4:25 PM Primary Care at [facility]

## 2020-12-16 LAB — CERVICOVAGINAL ANCILLARY ONLY
Bacterial Vaginitis (gardnerella): POSITIVE — AB
Candida Glabrata: NEGATIVE
Candida Vaginitis: NEGATIVE
Chlamydia: NEGATIVE
Comment: NEGATIVE
Comment: NEGATIVE
Comment: NEGATIVE
Comment: NEGATIVE
Comment: NEGATIVE
Comment: NORMAL
Neisseria Gonorrhea: NEGATIVE
Trichomonas: POSITIVE — AB

## 2020-12-16 LAB — CYTOLOGY - PAP
Comment: NEGATIVE
Diagnosis: NEGATIVE
High risk HPV: NEGATIVE

## 2020-12-17 ENCOUNTER — Other Ambulatory Visit: Payer: Self-pay | Admitting: Internal Medicine

## 2020-12-17 DIAGNOSIS — A599 Trichomoniasis, unspecified: Secondary | ICD-10-CM | POA: Insufficient documentation

## 2020-12-17 MED ORDER — METRONIDAZOLE 500 MG PO TABS: 500.0000 mg | ORAL_TABLET | Freq: Two times a day (BID) | ORAL | 0 refills | Status: DC

## 2020-12-19 ENCOUNTER — Telehealth: Payer: Self-pay | Admitting: *Deleted

## 2020-12-19 NOTE — Telephone Encounter (Signed)
Patient aware FMLA paperwork was faxed and confirmation received.  Aware she may pick up at her convenience.

## 2020-12-24 ENCOUNTER — Encounter: Payer: Self-pay | Admitting: Physical Medicine & Rehabilitation

## 2021-01-20 ENCOUNTER — Encounter: Payer: Self-pay | Admitting: Orthopaedic Surgery

## 2021-01-20 ENCOUNTER — Ambulatory Visit: Payer: Medicaid Other | Admitting: Orthopaedic Surgery

## 2021-01-20 VITALS — BP 121/76 | HR 102 | Ht 64.0 in | Wt 152.0 lb

## 2021-01-20 DIAGNOSIS — M47812 Spondylosis without myelopathy or radiculopathy, cervical region: Secondary | ICD-10-CM | POA: Diagnosis not present

## 2021-01-20 DIAGNOSIS — M4802 Spinal stenosis, cervical region: Secondary | ICD-10-CM | POA: Diagnosis not present

## 2021-01-20 NOTE — Progress Notes (Signed)
Office Visit Note   Patient: Kristin Montes           Date of Birth: 07/14/1975           MRN: [id number] Visit Date: 01/20/2021              Requested by: Arvilla Market, MD [address] [place],  [place] [address] PCP: Arvilla Market, MD   Assessment & Plan: Visit Diagnoses:  1. Cervical spondylosis   2. Foraminal stenosis of cervical region     Plan: We will set patient up for some physical therapy.  We reviewed MRI scan discussed pathophysiology of the condition.  We discussed operative options if she does not respond to nonoperative treatment.  Recheck 6 weeks.  Follow-Up Instructions: Return in about 6 weeks (around 03/03/2021).   Orders:  Orders Placed This Encounter  Procedures   Ambulatory referral to Physical Therapy   No orders of the defined types were placed in this encounter.     Procedures: No procedures performed   Clinical Data: No additional findings.   Subjective: Chief Complaint  Patient presents with   Neck - Pain    HPI 45 year old female new patient with chronic neck pain.  She has had pain that radiates from her neck into her right shoulder and between the shoulder blades.  Pain radiates down sometimes to the elbow right side greater than left.  Sometimes she has had numbness and tingling in her fingers no problems with lower extremity strength.  She has been to the emergency room several times with problems with neck pain shoulder pain.  She had temporary relief with steroids and then recurrence.  She is worked at [employer] for 3 years.  Past history of nephrectomy.  Cervical MRI scan showed multilevel degenerative changes with foraminal narrowing greatest at C4-5, C5-6 and C6-7.  Patient is used Tylenol and also is taking Aleve which she is not really supposed to since she has had a nephrectomy and states neither really gave her any relief.  Patient's had chronic headaches associated with her neck pain.  She has taken  some Percocet when they were severe.  Review of Systems negative for CVA negative for MI.  Negative history of neck trauma.  All the systems noncontributory to HPI.   Objective: Vital Signs: BP 121/76   Pulse (!) 102   Ht 5\' 4"  (1.626 m)   Wt 152 lb (68.9 kg)   BMI 26.09 kg/m   Physical Exam Constitutional:      Appearance: She is well-developed.  HENT:     Head: Normocephalic.     Right Ear: External ear normal.     Left Ear: External ear normal. There is no impacted cerumen.  Eyes:     Pupils: Pupils are equal, round, and reactive to light.  Neck:     Thyroid: No thyromegaly.     Trachea: No tracheal deviation.  Cardiovascular:     Rate and Rhythm: Normal rate.  Pulmonary:     Effort: Pulmonary effort is normal.  Abdominal:     Palpations: Abdomen is soft.  Musculoskeletal:     Cervical back: No rigidity.  Skin:    General: Skin is warm and dry.  Neurological:     Mental Status: She is alert and oriented to person, place, and time.  Psychiatric:        Behavior: Behavior normal.    Ortho Exam patient has brachial plexus tenderness more on the right  than the left.  Increased discomfort cervical compression no improvement with distraction.  Reflexes are 2+ and symmetrical no atrophy of the biceps or forearm.  Thenar strength is good negative carpal tunnel exam.  No lower extremity clonus normal heel toe gait.  Specialty Comments:  No specialty comments available.  Imaging:  Narrative & Impression  CLINICAL DATA:  Neck pain, right radiculopathy   EXAM: MRI CERVICAL SPINE WITHOUT CONTRAST   TECHNIQUE: Multiplanar, multisequence MR imaging of the cervical spine was performed. No intravenous contrast was administered.   COMPARISON:  None.   FINDINGS: Motion artifact is present.   Alignment: Trace retrolisthesis at C4-C5.   Vertebrae: Mild degenerative endplate irregularity and marrow changes. No suspicious osseous lesion.   Cord: No abnormal signal.    Posterior Fossa, vertebral arteries, paraspinal tissues: Subcentimeter right thyroid nodule for which no ultrasound follow-up is recommended by current guidelines. Otherwise unremarkable.   Disc levels:   C2-C3:  No canal or foraminal stenosis.   C3-C4: Disc bulge with endplate osteophytic ridging and uncovertebral hypertrophy. No canal or foraminal stenosis.   C4-C5: Disc bulge with endplate osteophytic ridging and uncovertebral hypertrophy. Mild canal stenosis. Mild right and marked left foraminal stenosis.   C5-C6: Disc bulge with endplate osteophytic ridging and uncovertebral hypertrophy. No canal stenosis. Marked foraminal stenosis.   C6-C7: Disc bulge with endplate osteophytic ridging and uncovertebral hypertrophy. No canal stenosis. Marked foraminal stenosis.   C7-T1:  No canal or foraminal stenosis.   IMPRESSION: Multilevel degenerative changes as detailed above. There is no high-grade canal stenosis. Foraminal narrowing is greatest from C4-C5 through C6-C7.     Electronically Signed   By: Guadlupe Spanish M.D.   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  No family history on file.  Past Surgical History:  Procedure Laterality Date   HERNIA REPAIR     NEPHRECTOMY     TUBAL LIGATION     Social History   Occupational History   Not on file  Tobacco Use   Smoking status: Every Day    Types: Cigarettes   Smokeless tobacco: Never   Tobacco comments:    6 cigs/day  Substance and Sexual Activity   Alcohol use: Not Currently    Comment: occasionally   Drug use: Yes    Types: Marijuana   Sexual activity: Not on file

## 2021-01-23 ENCOUNTER — Encounter: Payer: Self-pay | Admitting: Orthopaedic Surgery

## 2021-01-23 DIAGNOSIS — M4802 Spinal stenosis, cervical region: Secondary | ICD-10-CM | POA: Insufficient documentation

## 2021-01-27 ENCOUNTER — Telehealth: Payer: Self-pay | Admitting: Orthopaedic Surgery

## 2021-01-27 NOTE — Telephone Encounter (Signed)
Pt called stating she is in a lot of pain and would like to know if she can have something called in to help? Pt would like a CB to be updated please.   [phone]

## 2021-01-27 NOTE — Telephone Encounter (Signed)
Can you please advise since Dr. Yates is out of the office? 

## 2021-01-30 ENCOUNTER — Encounter: Payer: Self-pay | Admitting: Physical Therapy

## 2021-01-30 ENCOUNTER — Telehealth: Payer: Self-pay | Admitting: Orthopaedic Surgery

## 2021-01-30 ENCOUNTER — Ambulatory Visit: Payer: Medicaid Other | Attending: Orthopaedic Surgery | Admitting: Physical Therapy

## 2021-01-30 ENCOUNTER — Telehealth: Payer: Self-pay | Admitting: Internal Medicine

## 2021-01-30 ENCOUNTER — Other Ambulatory Visit: Payer: Self-pay

## 2021-01-30 DIAGNOSIS — R252 Cramp and spasm: Secondary | ICD-10-CM | POA: Diagnosis present

## 2021-01-30 DIAGNOSIS — M542 Cervicalgia: Secondary | ICD-10-CM | POA: Diagnosis not present

## 2021-01-30 DIAGNOSIS — M6281 Muscle weakness (generalized): Secondary | ICD-10-CM | POA: Insufficient documentation

## 2021-01-30 NOTE — Telephone Encounter (Signed)
I called, line rings and rings and then goes busy.  Duplicate message in chart. If patient calls back, please advise Dr. Ophelia Charter is out of the office and her message has been sent to Zonia Kief, PA-C to advise.

## 2021-01-30 NOTE — Telephone Encounter (Signed)
Pt called stating she is in a lot of pain and would like to know if she can have something called in to help? Pt would like a CB to be updated please.   [phone] 

## 2021-01-30 NOTE — Telephone Encounter (Signed)
Pt has not been on tramadol since 2015.Marland Kitchen ofc visit on 12/15/20 w/[place] MD pt presented with neck pain, no medication was prescribed at that time.. provider placed referral for pain management...for the best treatment of her pain pt may want to wait till speak with pain clinic. Appt scheduled for 02/10/21

## 2021-01-30 NOTE — Patient Instructions (Signed)
Access Code: [email or web address] URL: [email or web address] Date: 01/30/2021 Prepared by: Lysle Rubens  Exercises Seated Cervical Retraction - 1 x daily - 7 x weekly - 2 sets - 10 reps - 3 sec hold Seated Scapular Retraction - 1 x daily - 7 x weekly - 2 sets - 10 reps - 3 sec hold Seated Upper Trapezius Stretch - 1 x daily - 7 x weekly - 2 sets - 2 reps - 20 sec hold Gentle Levator Scapulae Stretch - 1 x daily - 7 x weekly - 2 sets - 2 reps - 20 sec hold

## 2021-01-30 NOTE — Telephone Encounter (Signed)
Pt states she's in need of some pain medication, mentioned specifically  traMADol (ULTRAM) 50 MG tablet [[id number]]  ENDED  Pharmacy  [facility] [phone] [place], [place] - [phone] AT [facility]  [address] [place], [place] [address]  Phone:  [phone]  Fax:  [phone]   Please advise and thank you

## 2021-01-30 NOTE — Therapy (Signed)
[facility] [address] [place], [place], [address] Phone: [phone]   Fax:  [phone]  Physical Therapy Evaluation  Patient Details  Name: Kristin Montes MRN: [id number] Date of Birth: 06-03-1976 Referring Provider (PT): Juluis Mire Date: 01/30/2021   PT End of Session - 01/30/21 1117     Visit Number 1    Date for PT Re-Evaluation 04/02/21    PT Start Time 1015    PT Stop Time 1049    PT Time Calculation (min) 34 min    Activity Tolerance Patient limited by pain    Behavior During Therapy [facility] for tasks assessed/performed             History reviewed. No pertinent past medical history.  Past Surgical History:  Procedure Laterality Date   HERNIA REPAIR     NEPHRECTOMY     TUBAL LIGATION      There were no vitals filed for this visit.    Subjective Assessment - 01/30/21 1015     Subjective Pt reports neck pain with radiating pain into RUE since March 2022. Pt does endorse occasional radiating LUE but mostly RUE; states intermittent N/T BUE. Pt reports she has been having HA radiating into occiput that seems to be associated with neck pain. Pt reports she has been dropping things around the house and having trouble with her grip strength. Pt denies changes in vision. Does state occasional migraine with aura beginning around the same time as neck pain. 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    Limitations Lifting    Diagnostic tests MRI neck    Patient Stated Goals reduce pain    Currently in Pain? Yes    Pain Score 8     Pain Location Neck    Pain Orientation Posterior;Right    Pain Descriptors / Indicators Aching;Burning    Pain Type Chronic pain    Pain Radiating Towards BUE R>L    Pain Onset More than a month ago    Pain Frequency Intermittent    Aggravating Factors  lifting heavy objects, bending over at cashier  desk, looking behind and up    Pain Relieving Factors gentle neck stretches, resting with neck pillow, heat                [facility] PT Assessment - 01/30/21 0001       Assessment   Medical Diagnosis neck pain with radiating pain    Referring Provider (PT) Ophelia Charter    Onset Date/Surgical Date --   March 2022   Hand Dominance Right    Next MD Visit 03/03/2021    Prior Therapy no      Precautions   Precautions None      Restrictions   Weight Bearing Restrictions No      Balance Screen   Has the patient fallen in the past 6 months No    Has the patient had a decrease in activity level because of a fear of falling?  No    Is the patient reluctant to leave their home because of a fear of falling?  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  Leisure dancing, walking      Sensation   Light Touch Appears Intact      Posture/Postural Control   Posture/Postural Control Postural limitations    Postural Limitations Rounded Shoulders;Forward head      ROM / Strength   AROM / PROM / Strength AROM;Strength      AROM   Overall AROM Comments limited R shoulder AROM with pain at end ranges and increase in shoulder and neck pain (pt denies injury to shoulder)    AROM Assessment Site Cervical    Cervical Flexion 45    Cervical Extension 30    Cervical - Right Side Bend 10    Cervical - Left Side Bend 35    Cervical - Right Rotation 45    Cervical - Left Rotation 50      Strength   Overall Strength Comments pain in R UT with all resisted R UE MMT; strength WFL otherwise    Strength Assessment Site Hand    Right/Left hand Right;Left    Right Hand Grip (lbs) 35    Left Hand Grip (lbs) 35      Palpation   Palpation comment very tender to palpation R UT/cervical paraspinals/periscapulars and into R arm       Special Tests    Special Tests Cervical    Cervical Tests Spurling's      Spurling's   Findings Positive    Side Right                        Objective measurements completed on examination: See above findings.               PT Education - 01/30/21 1117     Education Details Pt educated on POC and HEP    Person(s) Educated Patient    Methods Explanation;Demonstration;Handout    Comprehension Verbalized understanding;Returned demonstration              PT Short Term Goals - 01/30/21 1124       PT SHORT TERM GOAL #1   Title Pt will be I with initial HEP    Time 2    Period Weeks    Status New    Target Date 02/13/21               PT Long Term Goals - 01/30/21 1124       PT LONG TERM GOAL #1   Title Pt will be I with advanced HEP    Time 6    Period Weeks    Status New    Target Date 03/13/21      PT LONG TERM GOAL #2   Title Pt will report 50% reduction in cervical pain    Time 6    Period Weeks    Status New    Target Date 03/13/21      PT LONG TERM GOAL #3   Title Pt will report resolution of BUE radiating pain    Time 6    Period Weeks    Status New    Target Date 03/13/21      PT LONG TERM GOAL #4   Title Pt will demo cervical AROM <25% limited with no increase in cervical pain    Time 6    Period Weeks    Status New    Target Date 03/13/21      PT LONG TERM GOAL #5   Title Pt will report able to  return to full work duties at [employer] with no increase in cervical pain    Time 6    Period Weeks    Status New    Target Date 03/13/21                    Plan - 01/30/21 1118     Clinical Impression Statement Pt presents to clinic with reports of chronic cervical pain with RUE radiating pain present since March 2022, no known MOI. Pt endorses intermittent N/T BUE R>L. Imaging shows multilevel degenerative changes of cervical spine. Positive R Spurling's with reports of radiating pain into RUE. Pt  limited with R shoulder ROM/strength; cervical pain at end ranges. Unclear if there may also be a R shoulder pathology present as well. Pt demos limited and painful cervical ROM and is very tender to palpation B UT/periscapulars/cervical paraspinals. Pt works as a Conservation officer, nature at [employer] and is currently out d/t pain. She would like to be able to return to full work duties. Pt would benefit from skilled PT to address the above impairments.    Examination-Activity Limitations Reach Overhead;Lift;Sleep    Examination-Participation Restrictions Community Activity;Interpersonal Relationship    Stability/Clinical Decision Making Evolving/Moderate complexity    Clinical Decision Making Low    Rehab Potential Good    PT Frequency 2x / week    PT Duration 6 weeks    PT Treatment/Interventions ADLs/Self Care Home Management;Electrical Stimulation;Iontophoresis 4mg /ml Dexamethasone;Moist Heat;Traction;Therapeutic activities;Therapeutic exercise;Neuromuscular re-education;Manual techniques;Patient/family education;Passive range of motion;Dry needling;Taping    PT Next Visit Plan review/progress HEP, gentle progression to TE, cervical/scap stab ex's, manual/modalities as indicated    PT Home Exercise Plan see pt instructions    Consulted and Agree with Plan of Care Patient             Patient will benefit from skilled therapeutic intervention in order to improve the following deficits and impairments:  Decreased range of motion, Increased muscle spasms, Impaired UE functional use, Decreased activity tolerance, Pain, Hypomobility, Impaired flexibility, Decreased strength, Postural dysfunction  Visit Diagnosis: Cervicalgia  Cramp and spasm  Muscle weakness (generalized)     Problem List Patient Active Problem List   Diagnosis Date Noted   Foraminal stenosis of cervical region 01/23/2021   Trichomonal infection 12/17/2020   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 [place], [place], [place] Phone: [phone]   Fax:  [phone]  Name: Kristin Montes MRN: [name] Date of Birth: [birth year]

## 2021-01-30 NOTE — Telephone Encounter (Signed)
Tried to call patient to relay information. No answer. No voicemail.

## 2021-02-02 ENCOUNTER — Telehealth: Payer: Self-pay | Admitting: Orthopaedic Surgery

## 2021-02-02 NOTE — Telephone Encounter (Signed)
I called, no answer, no voicemail. 

## 2021-02-02 NOTE — Telephone Encounter (Signed)
Please advise work status.

## 2021-02-03 NOTE — Telephone Encounter (Signed)
Please advise 

## 2021-02-03 NOTE — Telephone Encounter (Signed)
I have tried to reach patient x 3. She has upcoming appointment with pain management on 02/10/2021. Will wait for return call.

## 2021-02-03 NOTE — Telephone Encounter (Signed)
Please see message from Dr. Ophelia Charter below. Let me know if there is something that you need me to do for you. Thanks.

## 2021-02-05 ENCOUNTER — Ambulatory Visit: Payer: Medicaid Other | Admitting: Physical Therapy

## 2021-02-05 ENCOUNTER — Telehealth: Payer: Self-pay | Admitting: Orthopaedic Surgery

## 2021-02-05 ENCOUNTER — Other Ambulatory Visit: Payer: Self-pay

## 2021-02-05 DIAGNOSIS — M542 Cervicalgia: Secondary | ICD-10-CM

## 2021-02-05 DIAGNOSIS — R252 Cramp and spasm: Secondary | ICD-10-CM

## 2021-02-05 NOTE — Therapy (Signed)
[place] [place], [place], [address] Phone: [phone]   Fax:  [phone]  Physical Therapy Treatment  Patient Details  Name: Kristin Montes MRN: [id number] Date of Birth: June 21, 1976 Referring Provider (PT): Bishop Limbo Date: 02/05/2021   PT End of Session - 02/05/21 1025     Visit Number 2    Date for PT Re-Evaluation 04/02/21    PT Start Time 7169    PT Stop Time 6789    PT Time Calculation (min) 46 min             No past medical history on file.  Past Surgical History:  Procedure Laterality Date   HERNIA REPAIR     NEPHRECTOMY     TUBAL LIGATION      There were no vitals filed for this visit.   Subjective Assessment - 02/05/21 0957     Subjective pt arrives to therapy with tears in her eyes verb pain has been really bad last 3 days. pt verb doing HEP    Currently in Pain? Yes    Pain Score 10-Worst pain ever    Pain Location Neck                               [facility] Adult PT Treatment/Exercise - 02/05/21 0001       Modalities   Modalities Traction;Electrical Stimulation;Moist Heat      Moist Heat Therapy   Number Minutes Moist Heat 15 Minutes    Moist Heat Location Cervical      Electrical Stimulation   Electrical Stimulation Location cerv    Electrical Stimulation Action supine    Electrical Stimulation Parameters IFC    Electrical Stimulation Goals Pain      Traction   Type of Traction Cervical    Max (lbs) 10    Time 10 min      Manual Therapy   Manual Therapy Soft tissue mobilization;Passive ROM;Manual Traction    Manual therapy comments quarded and pain limited    Soft tissue mobilization post cerv/trap   AA cerv retraction supine   Passive ROM cerv    Manual Traction cerv                      PT Short Term Goals - 02/05/21 1026       PT SHORT TERM GOAL #1   Title Pt will be I with initial HEP    Status Achieved                PT Long Term Goals - 01/30/21 1124       PT LONG TERM GOAL #1   Title Pt will be I with advanced HEP    Time 6    Period Weeks    Status New    Target Date 03/13/21      PT LONG TERM GOAL #2   Title Pt will report 50% reduction in cervical pain    Time 6    Period Weeks    Status New    Target Date 03/13/21      PT LONG TERM GOAL #3   Title Pt will report resolution of BUE radiating pain    Time 6    Period Weeks    Status New    Target Date 03/13/21      PT LONG TERM GOAL #4  Title Pt will demo cervical AROM <25% limited with no increase in cervical pain    Time 6    Period Weeks    Status New    Target Date 03/13/21      PT LONG TERM GOAL #5   Title Pt will report able to return to full work duties at [employer] with no increase in cervical pain    Time 6    Period Weeks    Status New    Target Date 03/13/21                   Plan - 02/05/21 1026     Clinical Impression Statement STG met. pt arrived in tears that continued throughout season d/t high pain levels. very guarded with STW. mech traction an destim/MH to help with pain relief    PT Treatment/Interventions ADLs/Self Care Home Management;Electrical Stimulation;Iontophoresis 56m/ml Dexamethasone;Moist Heat;Traction;Therapeutic activities;Therapeutic exercise;Neuromuscular re-education;Manual techniques;Patient/family education;Passive range of motion;Dry needling;Taping    PT Next Visit Plan assess symtoms and if any relief with STW/traction and estim             Patient will benefit from skilled therapeutic intervention in order to improve the following deficits and impairments:  Decreased range of motion, Increased muscle spasms, Impaired UE functional use, Decreased activity tolerance, Pain, Hypomobility, Impaired flexibility, Decreased strength, Postural dysfunction  Visit Diagnosis: Cervicalgia  Cramp and spasm     Problem List Patient Active Problem List   Diagnosis  Date Noted   Foraminal stenosis of cervical region 01/23/2021   Trichomonal infection 12/17/2020   S/p nephrectomy 03/16/2013    Kristin Montes,ANGIE PTA 02/05/2021, 10:30 AM  [place] [address]Phone: [phone]  Fax:  [phone] Name: Kristin SchmiesingMRN: [id number] of Birth: 312-Dec-1977

## 2021-02-05 NOTE — Telephone Encounter (Signed)
Pt stated she called a few times asking for Dr. Ophelia Charter to send in pain meds for her. No notes or message found. Please send pain meds in for this pt. Please send to pharmacy on file. Pt phone number is [phone].

## 2021-02-05 NOTE — Telephone Encounter (Signed)
Can you please advise? I have tried to reach patient multiple times previously as Fayrene Fearing sent message back stating OTC meds. Per your office note, she was using tylenol and aleve (although she was not supposed to take that due to nephrectomy) with no relief. Per chart, she contacted PCP and they have referred her to pain management. Is there something else that you recommend? Continue tylenol?

## 2021-02-06 ENCOUNTER — Ambulatory Visit: Payer: Medicaid Other | Admitting: Physical Medicine & Rehabilitation

## 2021-02-06 NOTE — Telephone Encounter (Signed)
I called, no answer. Please advise patient of responses should she call back. We have tried multiple times to reach her with no results.

## 2021-02-09 ENCOUNTER — Telehealth: Payer: Self-pay | Admitting: Orthopaedic Surgery

## 2021-02-09 NOTE — Telephone Encounter (Signed)
Pt asking for release to work paperwork that her workplace needed filled out for her. Pt stated she dropped it off last week but hasn't heard back and wanted to see if Dr. Ophelia Charter had filled it out. The best call back number is [phone].

## 2021-02-09 NOTE — Telephone Encounter (Signed)
Do you have this paperwork?

## 2021-02-10 ENCOUNTER — Encounter: Payer: Self-pay | Admitting: Physical Medicine & Rehabilitation

## 2021-02-10 ENCOUNTER — Other Ambulatory Visit: Payer: Self-pay

## 2021-02-10 ENCOUNTER — Encounter: Payer: Medicaid Other | Attending: Physical Medicine & Rehabilitation | Admitting: Physical Medicine & Rehabilitation

## 2021-02-10 VITALS — BP 125/84 | HR 70 | Temp 98.1°F | Ht 64.0 in | Wt 153.0 lb

## 2021-02-10 DIAGNOSIS — M542 Cervicalgia: Secondary | ICD-10-CM | POA: Insufficient documentation

## 2021-02-10 DIAGNOSIS — M5030 Other cervical disc degeneration, unspecified cervical region: Secondary | ICD-10-CM | POA: Diagnosis present

## 2021-02-10 DIAGNOSIS — M4802 Spinal stenosis, cervical region: Secondary | ICD-10-CM | POA: Diagnosis present

## 2021-02-10 DIAGNOSIS — M7918 Myalgia, other site: Secondary | ICD-10-CM | POA: Insufficient documentation

## 2021-02-10 MED ORDER — METHOCARBAMOL 500 MG PO TABS: 500.0000 mg | ORAL_TABLET | Freq: Three times a day (TID) | ORAL | 1 refills | Status: DC | PRN

## 2021-02-10 NOTE — Patient Instructions (Signed)

## 2021-02-10 NOTE — Progress Notes (Signed)
Subjective:    Patient ID: Kristin Montes, female    DOB: 04/22/1976, 45 y.o.   MRN: [id number]  HPI CC:  Posterior neck pain  45yo female with a hx of R>L posterior neck which started in March, pt denies accident or injury   Pain is intermittent but worsens with activity such as house work. Takes ~ 6Tylenol per day as well as ~10 tabs ibuprofen per day   Mostly stands at work   Has occ numbess of fingers but affects different fingers No weakness in arms  Last PT session July 20th, 2022, has additional sessions scheduled.  The patient has seen orthospine, Dr. Ophelia Charter who ordered an MRI of the cervical spine and is considering performing C-spine surgery.  Patient has been prescribed a couple short courses of narcotic analgesic Current activity level spends about 10+ hours in bed sits around 8 hours/day and stands 1 to 2 hours/day She is able to do all her ADLs including meal prep household duties needs some help with shopping. Review of systems positive for some numbness and tingling intermittent fingers spasms and depression.   CLINICAL DATA:  Neck pain, right radiculopathy   EXAM: MRI CERVICAL SPINE WITHOUT CONTRAST   TECHNIQUE: Multiplanar, multisequence MR imaging of the cervical spine was performed. No intravenous contrast was administered.   COMPARISON:  None.   FINDINGS: Motion artifact is present.   Alignment: Trace retrolisthesis at C4-C5.   Vertebrae: Mild degenerative endplate irregularity and marrow changes. No suspicious osseous lesion.   Cord: No abnormal signal.   Posterior Fossa, vertebral arteries, paraspinal tissues: Subcentimeter right thyroid nodule for which no ultrasound follow-up is recommended by current guidelines. Otherwise unremarkable.   Disc levels:   C2-C3:  No canal or foraminal stenosis.   C3-C4: Disc bulge with endplate osteophytic ridging and uncovertebral hypertrophy. No canal or foraminal stenosis.   C4-C5: Disc bulge with  endplate osteophytic ridging and uncovertebral hypertrophy. Mild canal stenosis. Mild right and marked left foraminal stenosis.   C5-C6: Disc bulge with endplate osteophytic ridging and uncovertebral hypertrophy. No canal stenosis. Marked foraminal stenosis.   C6-C7: Disc bulge with endplate osteophytic ridging and uncovertebral hypertrophy. No canal stenosis. Marked foraminal stenosis.   C7-T1:  No canal or foraminal stenosis.   IMPRESSION: Multilevel degenerative changes as detailed above. There is no high-grade canal stenosis. Foraminal narrowing is greatest from C4-C5 through C6-C7.     Electronically Signed   By: Guadlupe Spanish M.D.   On: 10/02/2020 12:58  CLINICAL DATA:  Neck pain.  Radiculopathy.   EXAM: CERVICAL SPINE - COMPLETE 4+ VIEW   COMPARISON:  No prior.   FINDINGS: No soft tissue swelling. Loss of normal cervical lordosis. Diffuse severe multilevel degenerative change with multilevel prominent disc space loss and endplate osteophyte formation. The C1 ring may be incomplete. This may be developmental. This may be from prior injury. Corticated bony density noted along the posterior aspect the C5 spinous process. This is most likely an old fracture fragment or ligamentous ossification. Similar finding noted along the posterior aspect of the spinous process of C6. This is less obvious. Multifocal bilateral neural foraminal narrowing noted.   IMPRESSION: 1. Loss of normal cervical lordosis. Diffuse severe multilevel degenerative change with multilevel prominent disc space loss and endplate osteophyte formation. Multifocal bilateral neural foraminal narrowing noted. No acute abnormality identified.   2. The C1 ring may be incomplete. This may be developmental or from prior injury. Old fracture fragments versus ligamentous ossification noted along the C5  and C6 spinous processes. If the patient has had a recent injury CT of cervical spine can be obtained to  further evaluate.     Electronically Signed   By: Maisie Fus  Register   On: 10/01/2020 16:54 Pain Inventory Average Pain 10 Pain Right Now 8 My pain is intermittent, sharp, stabbing, and aching  In the last 24 hours, has pain interfered with the following? General activity 7 Relation with others 8 Enjoyment of life 8 What TIME of day is your pain at its worst? morning , daytime, and night Sleep (in general) Fair  Pain is worse with: walking, bending, sitting, inactivity, and standing Pain improves with: rest, heat/ice, therapy/exercise, and medication Relief from Meds: 1  walk without assistance how many minutes can you walk? 10-15 ability to climb steps?  yes do you drive?  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 Social History   Socioeconomic History   Marital status: Single    Spouse name: Not on file   Number of children: Not on file   Years of education: Not on file   Highest education level: Not on file  Occupational History   Not on file  Tobacco Use   Smoking status: Every Day    Types: Cigarettes   Smokeless tobacco: Never   Tobacco comments:    6 cigs/day  Substance and Sexual Activity   Alcohol use: Not Currently    Comment: occasionally   Drug use: Yes    Types: Marijuana   Sexual activity: Not on file  Other Topics Concern   Not on file  Social History Narrative   Not on file   Social Determinants of Health   Financial Resource Strain: Not on file  Food Insecurity: Not on file  Transportation Needs: Not on file  Physical Activity: Not on file  Stress: Not on file  Social Connections: Not on file   Past Surgical History:  Procedure Laterality Date   HERNIA REPAIR     NEPHRECTOMY     TUBAL LIGATION     No past medical history on file. BP 125/84 (BP  Location: Right Arm)   Pulse 70   Temp 98.1 F (36.7 C) (Oral)   Ht 5\' 4"  (1.626 m)   Wt 153 lb (69.4 kg)   SpO2 98%   BMI 26.26 kg/m   Opioid Risk Score:   Fall Risk Score:  `1  Depression screen PHQ 2/9  Depression screen [facility] 2/9 12/15/2020 12/02/2020  Decreased Interest 1 2  Down, Depressed, Hopeless 1 2  PHQ - 2 Score 2 4  Altered sleeping 2 0  Tired, decreased energy 3 3  Change in appetite 1 0  Feeling bad or failure about yourself  2 0  Trouble concentrating 2 0  Moving slowly or fidgety/restless 3 0  Suicidal thoughts 1 0  PHQ-9 Score 16 7     Review of Systems  Constitutional:        Night sweats  HENT: Negative.    Eyes: Negative.   Respiratory: Negative.    Cardiovascular:  Positive for chest pain.  Gastrointestinal: Negative.   Endocrine: Negative.   Genitourinary: Negative.   Musculoskeletal:  Positive for back pain and neck pain.       Pain in both hips,spasms  Skin: Negative.  Allergic/Immunologic: Negative.   Neurological:  Positive for numbness and headaches.  Hematological: Negative.   Psychiatric/Behavioral:  Positive for dysphoric mood.       Objective:   Physical Exam Vitals and nursing note reviewed.  Constitutional:      Appearance: She is normal weight.  HENT:     Head: Normocephalic and atraumatic.  Eyes:     Extraocular Movements: Extraocular movements intact.     Conjunctiva/sclera: Conjunctivae normal.     Pupils: Pupils are equal, round, and reactive to light.  Neck:     Comments: Pain at end range upward and downward gaze   Cardiovascular:     Rate and Rhythm: Normal rate and regular rhythm.     Heart sounds: Normal heart sounds.  Pulmonary:     Effort: Pulmonary effort is normal. No respiratory distress.     Breath sounds: Normal breath sounds.  Abdominal:     General: Abdomen is flat. Bowel sounds are normal. There is no distension.     Palpations: Abdomen is soft.  Musculoskeletal:     Cervical back: Normal range  of motion.  Skin:    General: Skin is warm and dry.  Neurological:     Mental Status: She is alert and oriented to person, place, and time.  Psychiatric:        Mood and Affect: Mood normal.        Behavior: Behavior normal.    Motor strength is 5/5 bilateral deltoid, bicep, tricep, grip, hip flexor, knee extensor, ankle dorsiflexor and plantar flexor Negative straight leg raising test fine laterally Sensation normal to pinprick bilateral C5 C6-C7-C8 dermatomal distribution Deep tendon reflexes are 2+ bilateral biceps triceps brachioradialis Gait without evidence of ataxia or spasticity, no toe drag or knee instability Lumbar spine no tenderness palpation patient does have tenderness over the greater trochanters bilaterally. Also some tenderness bilateral upper trapezius as well as sternal costal area.      Assessment & Plan:  1.  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  She is currently undergoing orthopedic evaluation and has a follow-up appointment with Dr. Ophelia Charter. Patient does have some myofascial component to her pain we will start methocarbamol, may benefit from trigger point injections, as discussed with patient if she is undergoing ACDF I will not need to see her back.

## 2021-02-11 ENCOUNTER — Ambulatory Visit: Payer: Medicaid Other | Admitting: Physical Therapy

## 2021-02-11 NOTE — Telephone Encounter (Signed)
I spoke with patient in regards to paperwork. She states that her PCP originally took her out of work and completed her LOA paperwork until she was seen by Careers adviser. She states that you asked, during her appointment, if she was working and she told you that she was not working at the moment. She states that you all discussed that she would not be able to perform her normal job at this time and that she would probably require surgery. She has been going to PT. She states that her employer needs the paperwork completed as she is still out of work while attending PT.   Please advise. OK for patient to be out of work until follow up in the office post PT to see if she will require surgery?

## 2021-02-12 ENCOUNTER — Encounter: Payer: Self-pay | Admitting: Physical Therapy

## 2021-02-12 ENCOUNTER — Other Ambulatory Visit: Payer: Self-pay

## 2021-02-12 ENCOUNTER — Ambulatory Visit: Payer: Medicaid Other | Attending: Orthopaedic Surgery | Admitting: Physical Therapy

## 2021-02-12 DIAGNOSIS — M6281 Muscle weakness (generalized): Secondary | ICD-10-CM | POA: Diagnosis present

## 2021-02-12 DIAGNOSIS — R252 Cramp and spasm: Secondary | ICD-10-CM | POA: Diagnosis present

## 2021-02-12 DIAGNOSIS — M542 Cervicalgia: Secondary | ICD-10-CM | POA: Diagnosis present

## 2021-02-12 NOTE — Therapy (Signed)
[facility] [address] [place], [place], [address] Phone: [phone]   Fax:  [phone]  Physical Therapy Treatment  Patient Details  Name: Kristin Montes MRN: [id number] Date of Birth: [birth year] Referring Provider (PT): Juluis Mire Date: 02/12/2021   PT End of Session - 02/12/21 0852     Visit Number 3    Date for PT Re-Evaluation 04/02/21    PT Start Time 0832    PT Stop Time 0902    PT Time Calculation (min) 30 min    Activity Tolerance Patient tolerated treatment well;Patient limited by pain    Behavior During Therapy [facility] for tasks assessed/performed             History reviewed. No pertinent past medical history.  Past Surgical History:  Procedure Laterality Date   HERNIA REPAIR     NEPHRECTOMY     TUBAL LIGATION      There were no vitals filed for this visit.   Subjective Assessment - 02/12/21 0833     Subjective Pt reports going to pain management MD receiving muscle relaxer, feeling better now    Currently in Pain? No/denies                               [facility] Adult PT Treatment/Exercise - 02/12/21 0001       Exercises   Exercises Neck      Neck Exercises: Machines for Strengthening   UBE (Upper Arm Bike) L1 x 3 min      Neck Exercises: Standing   Other Standing Exercises Rows & Ext yellow 2x10    Other Standing Exercises ER AROM scap retract 2x10      Traction   Type of Traction Cervical    Max (lbs) 10    Time 10 min      Manual Therapy   Manual Therapy Soft tissue mobilization    Soft tissue mobilization post cerv/trap                      PT Short Term Goals - 02/05/21 1026       PT SHORT TERM GOAL #1   Title Pt will be I with initial HEP    Status Achieved               PT Long Term Goals - 01/30/21 1124       PT LONG TERM GOAL #1   Title Pt will be I with advanced HEP    Time 6    Period Weeks    Status New    Target  Date 03/13/21      PT LONG TERM GOAL #2   Title Pt will report 50% reduction in cervical pain    Time 6    Period Weeks    Status New    Target Date 03/13/21      PT LONG TERM GOAL #3   Title Pt will report resolution of BUE radiating pain    Time 6    Period Weeks    Status New    Target Date 03/13/21      PT LONG TERM GOAL #4   Title Pt will demo cervical AROM <25% limited with no increase in cervical pain    Time 6    Period Weeks    Status New    Target Date 03/13/21  PT LONG TERM GOAL #5   Title Pt will report able to return to full work duties at [employer] with no increase in cervical pain    Time 6    Period Weeks    Status New    Target Date 03/13/21                   Plan - 02/12/21 0853     Clinical Impression Statement Pt enters clinic reporting less pain after seeing pain management MD. She was able to progress to com light scapular retraction interventions but with some grimacing. Tightness and TP noted in both upper taps with STM. Pt elected to continues with cervical traction.    Examination-Activity Limitations Reach Overhead;Lift;Sleep    Examination-Participation Restrictions Community Activity;Interpersonal Relationship    Stability/Clinical Decision Making Evolving/Moderate complexity    Rehab Potential Good    PT Frequency 2x / week    PT Duration 6 weeks    PT Treatment/Interventions ADLs/Self Care Home Management;Electrical Stimulation;Iontophoresis 4mg /ml Dexamethasone;Moist Heat;Traction;Therapeutic activities;Therapeutic exercise;Neuromuscular re-education;Manual techniques;Patient/family education;Passive range of motion;Dry needling;Taping    PT Next Visit Plan assess symtoms and if any relief with STW/traction and estim             Patient will benefit from skilled therapeutic intervention in order to improve the following deficits and impairments:  Decreased range of motion, Increased muscle spasms, Impaired UE functional use,  Decreased activity tolerance, Pain, Hypomobility, Impaired flexibility, Decreased strength, Postural dysfunction  Visit Diagnosis: Muscle weakness (generalized)  Cervicalgia  Cramp and spasm     Problem List Patient Active Problem List   Diagnosis Date Noted   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 [place], [place], [place] Phone: [phone]   Fax:  [phone]  Name: Temprence Rhines MRN: [name] Date of Birth: 1975-10-20

## 2021-02-12 NOTE — Telephone Encounter (Signed)
FYI. Please see response from Dr. Ophelia Charter regarding patient's paperwork. He did not take her out of work.  Paperwork not to be filled out. He spoke with patient.

## 2021-02-13 ENCOUNTER — Ambulatory Visit: Payer: Medicaid Other | Admitting: Physical Therapy

## 2021-02-13 DIAGNOSIS — M6281 Muscle weakness (generalized): Secondary | ICD-10-CM | POA: Diagnosis not present

## 2021-02-13 DIAGNOSIS — M542 Cervicalgia: Secondary | ICD-10-CM

## 2021-02-13 NOTE — Therapy (Signed)
[facility] [address] [place], [place], [address] Phone: [phone]   Fax:  [phone]  Physical Therapy Treatment  Patient Details  Name: Kristin Montes MRN: [id number] Date of Birth: 1976-03-10 Referring Provider (PT): Juluis Mire Date: 02/13/2021   PT End of Session - 02/13/21 1055     Visit Number 4    Date for PT Re-Evaluation 04/02/21    PT Start Time 0955    PT Stop Time 1040    PT Time Calculation (min) 45 min             No past medical history on file.  Past Surgical History:  Procedure Laterality Date   HERNIA REPAIR     NEPHRECTOMY     TUBAL LIGATION      There were no vitals filed for this visit.   Subjective Assessment - 02/13/21 0954     Subjective feeling better. traction helps and muscle relaxers are helping    Currently in Pain? Yes    Pain Score 4     Pain Location Neck    Pain Orientation Right;Left                               [facility] Adult PT Treatment/Exercise - 02/13/21 0001       Neck Exercises: Machines for Strengthening   UBE (Upper Arm Bike) L 2 2 min fwd/2 min back      Neck Exercises: Theraband   Scapula Retraction 20 reps;Red    Shoulder Extension 20 reps;Red      Neck Exercises: Standing   Other Standing Exercises 3 # shruggs,backward rolls, ext and ER 2 sets 10      Moist Heat Therapy   Number Minutes Moist Heat 15 Minutes    Moist Heat Location Cervical      Traction   Type of Traction Cervical    Max (lbs) 10    Time 15      Manual Therapy   Manual Therapy Soft tissue mobilization    Manual therapy comments very tight with TP on RT side    Soft tissue mobilization post cerv/trap                      PT Short Term Goals - 02/05/21 1026       PT SHORT TERM GOAL #1   Title Pt will be I with initial HEP    Status Achieved               PT Long Term Goals - 02/13/21 1055       PT LONG TERM GOAL #1    Title Pt will be I with advanced HEP    Status On-going      PT LONG TERM GOAL #2   Title Pt will report 50% reduction in cervical pain    Status On-going      PT LONG TERM GOAL #3   Title Pt will report resolution of BUE radiating pain    Status On-going      PT LONG TERM GOAL #4   Title Pt will demo cervical AROM <25% limited with no increase in cervical pain    Status On-going      PT LONG TERM GOAL #5   Title Pt will report able to return to full work duties at [employer] with no increase in cervical pain  Status On-going                   Plan - 02/13/21 1056     Clinical Impression Statement pt moving better than last time this PTA tx her, she tolerated more ex and mvmt with some grimcing and postural cuing needed. STW to tolerance to BIL cerv/trap and rhom with increased tightness and TP on RT side, discussed and educ pt DN and possibly trying next session. Relief from MH and traction    PT Treatment/Interventions ADLs/Self Care Home Management;Electrical Stimulation;Iontophoresis 4mg /ml Dexamethasone;Moist Heat;Traction;Therapeutic activities;Therapeutic exercise;Neuromuscular re-education;Manual techniques;Patient/family education;Passive range of motion;Dry needling;Taping    PT Next Visit Plan progress ther ex and mvmt as tolerated. 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 [place], [place], [place] Phone: [phone]   Fax:  [phone]  Name: Kristin Montes MRN: [name] Date of Birth: [birth year]

## 2021-02-17 ENCOUNTER — Telehealth: Payer: Self-pay | Admitting: Orthopaedic Surgery

## 2021-02-17 ENCOUNTER — Ambulatory Visit: Payer: Medicaid Other | Admitting: Physical Therapy

## 2021-02-17 NOTE — Telephone Encounter (Signed)
Pt called requesting pain medication for pain in neck and back. Please send to [facility]. Pt phone number is [phone].

## 2021-02-18 ENCOUNTER — Ambulatory Visit: Payer: Medicaid Other | Admitting: Rehabilitative and Restorative Service Providers"

## 2021-02-18 ENCOUNTER — Other Ambulatory Visit: Payer: Self-pay | Admitting: Orthopaedic Surgery

## 2021-02-18 NOTE — Telephone Encounter (Signed)
Please advise 

## 2021-02-20 ENCOUNTER — Other Ambulatory Visit: Payer: Self-pay

## 2021-02-20 ENCOUNTER — Ambulatory Visit: Payer: Medicaid Other | Admitting: Physical Therapy

## 2021-02-20 DIAGNOSIS — M6281 Muscle weakness (generalized): Secondary | ICD-10-CM

## 2021-02-20 DIAGNOSIS — M542 Cervicalgia: Secondary | ICD-10-CM

## 2021-02-20 DIAGNOSIS — R252 Cramp and spasm: Secondary | ICD-10-CM

## 2021-02-20 NOTE — Therapy (Signed)
[place] [place], [place], [address] Phone: [phone]   Fax:  [phone]  Physical Therapy Treatment  Patient Details  Name: Kristin Montes MRN: [id number] Date of Birth: 1976-03-12 Referring Provider (PT): Bishop Limbo Date: 02/20/2021   PT End of Session - 02/20/21 1038     Visit Number 5    Date for PT Re-Evaluation 04/02/21    PT Start Time 1005    PT Stop Time 1035    PT Time Calculation (min) 30 min             No past medical history on file.  Past Surgical History:  Procedure Laterality Date   HERNIA REPAIR     NEPHRECTOMY     TUBAL LIGATION      There were no vitals filed for this visit.   Subjective Assessment - 02/20/21 1007     Subjective overall feeling better, weekend was kinda rough.50% better overall- muscle relaxers are helping    Currently in Pain? Yes    Pain Score 6     Pain Location Neck                               [facility] Adult PT Treatment/Exercise - 02/20/21 0001       Neck Exercises: Machines for Strengthening   UBE (Upper Arm Bike) L 2 2 min fwd/2 min back    Cybex Row 20# 2 sets 10    Lat Pull 20# 2 sets 10      Neck Exercises: Standing   Other Standing Exercises 5# shruggs,backward rolls and shld ext 2 sets 10      Manual Therapy   Manual Therapy Soft tissue mobilization;Passive ROM    Soft tissue mobilization post cerv/trap    Passive ROM STW with end range stretching to csoine                      PT Short Term Goals - 02/05/21 1026       PT SHORT TERM GOAL #1   Title Pt will be I with initial HEP    Status Achieved               PT Long Term Goals - 02/20/21 1037       PT LONG TERM GOAL #1   Title Pt will be I with advanced HEP    Status Partially Met      PT LONG TERM GOAL #2   Title Pt will report 50% reduction in cervical pain    Status Achieved      PT LONG TERM GOAL #3   Title Pt will  report resolution of BUE radiating pain    Status Partially Met      PT LONG TERM GOAL #4   Title Pt will demo cervical AROM <25% limited with no increase in cervical pain    Status Partially Met      PT LONG TERM GOAL #5   Title Pt will report able to return to full work duties at [employer] with no increase in cervical pain    Status On-going                   Plan - 02/20/21 1045     Clinical Impression Statement pt verb 50% better but last 3 days HAs, states no real issues with radiating symptoms. pt  is still pain limited with ther ex. Pt declined traction and estim today. Good cerv ROM passisvely but tightness at end range with pain, pt with TP BIL cerv ( trap and rhom) RT> Left- STW with DN to see if that helps pain/symptoms. Progressing with LTGs    PT Treatment/Interventions ADLs/Self Care Home Management;Electrical Stimulation;Iontophoresis 37m/ml Dexamethasone;Moist Heat;Traction;Therapeutic activities;Therapeutic exercise;Neuromuscular re-education;Manual techniques;Patient/family education;Passive range of motion;Dry needling;Taping    PT Next Visit Plan assess if DN helped             Patient will benefit from skilled therapeutic intervention in order to improve the following deficits and impairments:  Decreased range of motion, Increased muscle spasms, Impaired UE functional use, Decreased activity tolerance, Pain, Hypomobility, Impaired flexibility, Decreased strength, Postural dysfunction  Visit Diagnosis: Muscle weakness (generalized)  Cervicalgia  Cramp and spasm     Problem List Patient Active Problem List   Diagnosis Date Noted   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

## 2021-02-25 ENCOUNTER — Other Ambulatory Visit: Payer: Self-pay

## 2021-02-25 ENCOUNTER — Encounter: Payer: Self-pay | Admitting: Rehabilitative and Restorative Service Providers"

## 2021-02-25 ENCOUNTER — Ambulatory Visit: Payer: Medicaid Other | Admitting: Rehabilitative and Restorative Service Providers"

## 2021-02-25 DIAGNOSIS — M6281 Muscle weakness (generalized): Secondary | ICD-10-CM

## 2021-02-25 DIAGNOSIS — R252 Cramp and spasm: Secondary | ICD-10-CM

## 2021-02-25 DIAGNOSIS — M542 Cervicalgia: Secondary | ICD-10-CM

## 2021-02-25 NOTE — Patient Instructions (Signed)

## 2021-02-25 NOTE — Therapy (Signed)
[place] [place], [place], [address] Phone: [phone]   Fax:  [phone]  Physical Therapy Treatment  Patient Details  Name: Kristin Montes MRN: [id number] Date of Birth: 04-01-1976 Referring Provider (PT): Bishop Limbo Date: 02/25/2021   PT End of Session - 02/25/21 1016     Visit Number 6    Date for PT Re-Evaluation 04/02/21    PT Start Time 1011    PT Stop Time 1055    PT Time Calculation (min) 44 min    Activity Tolerance Patient tolerated treatment well;Patient limited by pain    Behavior During Therapy [facility] for tasks assessed/performed             History reviewed. No pertinent past medical history.  Past Surgical History:  Procedure Laterality Date   HERNIA REPAIR     NEPHRECTOMY     TUBAL LIGATION      There were no vitals filed for this visit.   Subjective Assessment - 02/25/21 1014     Subjective Pt reports that she has been taking more muscle relexers than she is supposed to and has notified the MD, but he does not want to give her anything else.    Patient Stated Goals reduce pain    Currently in Pain? Yes    Pain Score 7     Pain Location Neck    Pain Orientation Posterior    Pain Descriptors / Indicators Aching;Sharp    Pain Type Chronic pain                               [facility] Adult PT Treatment/Exercise - 02/25/21 0001       Neck Exercises: Machines for Strengthening   UBE (Upper Arm Bike) L 2 3 min fwd/3 min back    Cybex Row 20# 2 sets 10    Lat Pull 20# 2 sets 10      Neck Exercises: Standing   Neck Retraction 20 reps    Other Standing Exercises 5# shrugs,backward rolls and shld ext 2 sets 10      Neck Exercises: Seated   Other Seated Exercise Shoulder isometrics in chair with balls:  depression, extension, adduct.  x10 each      Manual Therapy   Manual Therapy Soft tissue mobilization;Myofascial release    Soft tissue mobilization  post cerv/trap    Myofascial Release DN to R upper trap, rhomboids, and cervical multifidi              Trigger Point Dry Needling - 02/25/21 0001     Consent Given? Yes    Education Handout Provided Yes    Muscles Treated Head and Neck Upper trapezius;Cervical multifidi    Muscles Treated Upper Quadrant Rhomboids    Upper Trapezius Response Twitch reponse elicited    Cervical multifidi Response Twitch reponse elicited    Rhomboids Response Twitch response elicited                    PT Short Term Goals - 02/05/21 1026       PT SHORT TERM GOAL #1   Title Pt will be I with initial HEP    Status Achieved               PT Long Term Goals - 02/20/21 1037       PT LONG TERM GOAL #1  Title Pt will be I with advanced HEP    Status Partially Met      PT LONG TERM GOAL #2   Title Pt will report 50% reduction in cervical pain    Status Achieved      PT LONG TERM GOAL #3   Title Pt will report resolution of BUE radiating pain    Status Partially Met      PT LONG TERM GOAL #4   Title Pt will demo cervical AROM <25% limited with no increase in cervical pain    Status Partially Met      PT LONG TERM GOAL #5   Title Pt will report able to return to full work duties at [employer] with no increase in cervical pain    Status On-going                   Plan - 02/25/21 1058     Clinical Impression Statement Ms Gruetzmacher had increased pain today compared to last session.  However, following DN and manual therapy, pt reports pain decreased from 7/10 to 6/10 with her muscles feeling looser.  She requires cuing during cybex strengthening to not shrug her shoulders.  She continues to require skilled PT to progress towards her goal related activities.    PT Treatment/Interventions ADLs/Self Care Home Management;Electrical Stimulation;Iontophoresis 41m/ml Dexamethasone;Moist Heat;Traction;Therapeutic activities;Therapeutic exercise;Neuromuscular re-education;Manual  techniques;Patient/family education;Passive range of motion;Dry needling;Taping    PT Next Visit Plan DN if indicated.  strengthening, ROM, manual therapy as indicated.    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

## 2021-02-27 ENCOUNTER — Ambulatory Visit: Payer: Medicaid Other | Admitting: Physical Therapy

## 2021-03-03 ENCOUNTER — Ambulatory Visit: Payer: Medicaid Other | Admitting: Physical Therapy

## 2021-03-06 ENCOUNTER — Ambulatory Visit: Payer: Medicaid Other | Admitting: Rehabilitative and Restorative Service Providers"

## 2021-03-10 ENCOUNTER — Ambulatory Visit: Payer: Medicaid Other | Admitting: Physical Therapy

## 2021-03-10 ENCOUNTER — Encounter: Payer: Self-pay | Admitting: Physical Therapy

## 2021-03-10 ENCOUNTER — Other Ambulatory Visit: Payer: Self-pay

## 2021-03-10 DIAGNOSIS — M6281 Muscle weakness (generalized): Secondary | ICD-10-CM | POA: Diagnosis not present

## 2021-03-10 DIAGNOSIS — R252 Cramp and spasm: Secondary | ICD-10-CM

## 2021-03-10 DIAGNOSIS — M542 Cervicalgia: Secondary | ICD-10-CM

## 2021-03-10 NOTE — Therapy (Signed)
[place]. [place], [place], [address] Phone: [phone]   Fax:  [phone]  Physical Therapy Discharge Summary  Patient Details  Name: Kristin Montes MRN: [id number] Date of Birth: Mar 08, 1976 Referring Provider (PT): Bishop Limbo Date: 03/10/2021   PT End of Session - 03/10/21 6962     Visit Number 7    Date for PT Re-Evaluation 04/02/21    PT Start Time 1444    PT Stop Time 1522    PT Time Calculation (min) 38 min    Activity Tolerance Patient tolerated treatment well;Patient limited by pain    Behavior During Therapy [facility] for tasks assessed/performed             History reviewed. No pertinent past medical history.  Past Surgical History:  Procedure Laterality Date   HERNIA REPAIR     NEPHRECTOMY     TUBAL LIGATION      There were no vitals filed for this visit.   Subjective Assessment - 03/10/21 1446     Subjective Took a muscle relaxer but it is not helping. In a lot of pain- been crying all morning. Not scheduled for MD f/u up.    Diagnostic tests MRI neck    Patient Stated Goals reduce pain    Currently in Pain? Yes    Pain Score 9     Pain Location Neck    Pain Orientation Posterior    Pain Descriptors / Indicators Aching;Sharp    Pain Type Chronic pain                [facility] PT Assessment - 03/10/21 1455       Assessment   Medical Diagnosis neck pain with radiating pain    Referring Provider (PT) Lorin Mercy    Onset Date/Surgical Date --   March 2022     AROM   AROM Assessment Site Cervical    Cervical Flexion 21    Cervical Extension 28    Cervical - Right Side Bend 16   c/o "pop"   Cervical - Left Side Bend 31    Cervical - Right Rotation 42   c/o cracking   Cervical - Left Rotation 47   c/o cracking                          [facility] Adult PT Treatment/Exercise - 03/10/21 0001       Neck Exercises: Machines for Strengthening   UBE (Upper Arm Bike) L  1 3 min fwd/3 min back      Neck Exercises: Seated   Cervical Rotation Right;Left;5 reps    Cervical Rotation Limitations SNAG to tolerance   cues to avoid pushing inot pain   Other Seated Exercise cervical extension SNAG x5 to tolerance   cues to avoid pushing into pain     Manual Therapy   Manual Therapy Soft tissue mobilization;Myofascial release    Manual therapy comments sitting    Soft tissue mobilization STM to B UT, LS, rhomboids, scalenes    Myofascial Release manual TPR to B UT, LS, rhomboids, scalenes   very TTP and tight R>L                   PT Education - 03/10/21 1531     Education Details update to HEP; discussion on objective progress and remaining impairments    Person(s) Educated Patient    Methods Explanation;Demonstration;Tactile cues;Handout;Verbal cues  Comprehension Verbalized understanding;Returned demonstration              PT Short Term Goals - 03/10/21 1452       PT SHORT TERM GOAL #1   Title Pt will be I with initial HEP    Status Achieved               PT Long Term Goals - 03/10/21 1452       PT LONG TERM GOAL #1   Title Pt will be I with advanced HEP    Status Achieved      PT LONG TERM GOAL #2   Title Pt will report 50% reduction in cervical pain    Status On-going   reports no change     PT LONG TERM GOAL #3   Title Pt will report resolution of BUE radiating pain    Status Achieved   no longer noting radiating pain down the UE, only in the posterior neck, along B shoulders and clavicles     PT LONG TERM GOAL #4   Title Pt will demo cervical AROM <25% limited with no increase in cervical pain    Status Partially Met   still limited and painful     PT LONG TERM GOAL #5   Title Pt will report able to return to full work duties at [employer] with no increase in cervical pain    Status On-going   has not returned to work d/t pain                  Plan - 03/10/21 1532     Clinical Impression Statement  Patient arrived to session with report of severe neck pain. Notes "I have been crying all morning" d/t pain. At this time patient is agreeable to D/C d/t lack of progress with PT and was advised to f/u with her referring MD- patient reported understanding. Patient reports compliance with HEP but notes no improvement in neck pain. Does report improvement in radiation of pain down B UEs. Patient is still limited in her tolerance for work activities d/t pain. Cervical ROM revealed small improvement in R SBing, otherwise small decline in all other cervical motions.  Educated patient on gentle cervical ROM HEP to address remaining deficits with consistent cueing to avoid pushing into pain- patient reported understanding. Remainder of session was spent on MT to address pain and soft tissue restriction. Patient reported good benefit from MT. Requested to end session early d/t her son coming to pick her up. Patient is independent with HEP and ready for DC at this time.    PT Treatment/Interventions ADLs/Self Care Home Management;Electrical Stimulation;Iontophoresis 58m/ml Dexamethasone;Moist Heat;Traction;Therapeutic activities;Therapeutic exercise;Neuromuscular re-education;Manual techniques;Patient/family education;Passive range of motion;Dry needling;Taping    PT Next Visit Plan DC at this time    Consulted and Agree with Plan of Care Patient             Patient will benefit from skilled therapeutic intervention in order to improve the following deficits and impairments:  Decreased range of motion, Increased muscle spasms, Impaired UE functional use, Decreased activity tolerance, Pain, Hypomobility, Impaired flexibility, Decreased strength, Postural dysfunction  Visit Diagnosis: Cervicalgia  Muscle weakness (generalized)  Cramp and spasm     Problem List Patient Active Problem List   Diagnosis Date Noted   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  Patient goals were partially met. Patient is being discharged due to limited benefit from therapy.     Janene Harvey, PT, DPT 03/10/21 3:35 PM    [place]. [place], [place], [address] Phone: [phone]   Fax:  [phone]  Name: Kristin Montes MRN: [id number] Date of Birth: 1975/10/04

## 2021-03-11 ENCOUNTER — Telehealth: Payer: Medicaid Other | Admitting: Nurse Practitioner

## 2021-03-11 DIAGNOSIS — M549 Dorsalgia, unspecified: Secondary | ICD-10-CM

## 2021-03-11 DIAGNOSIS — R399 Unspecified symptoms and signs involving the genitourinary system: Secondary | ICD-10-CM

## 2021-03-11 NOTE — Progress Notes (Signed)
Based on what you shared with me it looks like you have uti symptoms with back pain,that should be evaluated in a face to face office visit. Due to the associating back pain you will need a urinalysis and urine culture for proper treatment. NOTE: There will be NO CHARGE for this eVisit   If you are having a true medical emergency please call 911.      For an urgent face to face visit, [facility] has six urgent care centers for your convenience:     [facility] [place] Get Driving Directions [phone] [address] [place], [place] [address]    [facility] ([place]) Get Driving Directions [phone] [address] [place], [place] [address]  [facility] ([place]) Get Driving Directions [phone] [address] ,  [place]  [address]  [facility] Get Driving Directions [phone] [address] [place], [place] [address]    [facility] [place] Get Driving Directions  [phone] [address] [place], [place] [address]    [facility] at [facility] Get Driving Directions [phone] [address] [place], [place] [address]  Your MyChart E-visit questionnaire answers were reviewed by a board certified advanced clinical practitioner to complete your personal care plan based on your specific symptoms.  Thank you for using e-Visits.         

## 2021-03-17 ENCOUNTER — Encounter: Payer: Medicaid Other | Admitting: Physical Therapy

## 2021-03-19 ENCOUNTER — Other Ambulatory Visit: Payer: Self-pay

## 2021-03-19 ENCOUNTER — Encounter: Payer: Self-pay | Admitting: Surgery

## 2021-03-19 ENCOUNTER — Ambulatory Visit: Payer: Medicaid Other | Admitting: Surgery

## 2021-03-19 DIAGNOSIS — M47812 Spondylosis without myelopathy or radiculopathy, cervical region: Secondary | ICD-10-CM | POA: Diagnosis not present

## 2021-03-19 NOTE — Progress Notes (Signed)
Office Visit Note   Patient: Kristin Montes           Date of Birth: 06-03-1976           MRN: [id number] Visit Date: 03/19/2021              Requested by: Arvilla Market, MD [address], [place],  [place] [address] PCP: Arvilla Market, MD   Assessment & Plan: Visit Diagnoses:  1. Cervical spondylosis     Plan: Since patient is established with pain management I think it would be worthwhile for Dr. Clementeen Hoof to perform diagnostic/therapeutic cervical ESI's patient is scheduled to see him soon and I advised her that he should see my note reflecting what we discussed today.  She said he is planning also to repeat her cervical MRI and I think this is reasonable.  She will follow-up with Dr. Ophelia Charter in 6 weeks for recheck and hopefully she has had at least 1 injection by that time.  All questions answered.  Follow-Up Instructions: Return in about 5 weeks (around 04/23/2021) for with dr yates recheck cervical spine. .   Orders:  No orders of the defined types were placed in this encounter.  No orders of the defined types were placed in this encounter.     Procedures: No procedures performed   Clinical Data: No additional findings.   Subjective: Chief Complaint  Patient presents with   Neck - Follow-up    HPI 45 year old black female returns for recheck of her neck pain.  Patient was seen by Dr. Ophelia Charter for this January 20, 2021.  He had reviewed cervical MRI that was ordered outside of our office from October 02, 2020. pain that is unchanged from previous visit.  She is currently established with pain management Dr. Karie Kirks.  States that he had mentioned the possibility of doing cervical ESI's and also discussed repeating cervical MRI scan.   Review of Systems Positive for productive cough.  Objective: Vital Signs: There were no vitals taken for this visit.  Physical Exam HENT:     Head: Normocephalic.  Eyes:     Extraocular Movements:  Extraocular movements intact.  Pulmonary:     Effort: No respiratory distress.  Musculoskeletal:     Comments: Positive bilateral brachial plexus trapezius and scapular tenderness.  Bilateral shoulders unremarkable.  No focal motor deficits.  Neurological:     Mental Status: She is alert and oriented to person, place, and time.  Psychiatric:        Mood and Affect: Mood normal.    Ortho Exam  Specialty Comments:  No specialty comments available.  Imaging: No results found.   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  No family history on file.  Past Surgical History:  Procedure Laterality Date   HERNIA REPAIR     NEPHRECTOMY     TUBAL LIGATION     Social History   Occupational History   Not on file  Tobacco Use   Smoking status: Every Day    Types: Cigarettes   Smokeless tobacco: Never   Tobacco comments:    6 cigs/day  Substance and Sexual Activity   Alcohol use: Not Currently    Comment: occasionally   Drug use: Yes    Types: Marijuana   Sexual activity: Not on file

## 2021-04-02 ENCOUNTER — Encounter: Payer: Self-pay | Admitting: Neurology

## 2021-04-02 ENCOUNTER — Ambulatory Visit: Payer: Medicaid Other | Admitting: Neurology

## 2021-04-02 VITALS — BP 125/81 | HR 66 | Ht 64.0 in | Wt 151.0 lb

## 2021-04-02 DIAGNOSIS — G44221 Chronic tension-type headache, intractable: Secondary | ICD-10-CM | POA: Diagnosis not present

## 2021-04-02 DIAGNOSIS — M542 Cervicalgia: Secondary | ICD-10-CM

## 2021-04-02 MED ORDER — AMITRIPTYLINE HCL 25 MG PO TABS: 25.0000 mg | ORAL_TABLET | Freq: Every day | ORAL | 0 refills | Status: DC

## 2021-04-02 MED ORDER — CYCLOBENZAPRINE HCL 10 MG PO TABS: 10.0000 mg | ORAL_TABLET | Freq: Three times a day (TID) | ORAL | 0 refills | Status: DC | PRN

## 2021-04-02 MED ORDER — SUMATRIPTAN SUCCINATE 50 MG PO TABS: 50.0000 mg | ORAL_TABLET | ORAL | 0 refills | Status: DC | PRN

## 2021-04-02 MED ORDER — NAPROXEN 500 MG PO TABS: 500.0000 mg | ORAL_TABLET | Freq: Two times a day (BID) | ORAL | 0 refills | Status: DC | PRN

## 2021-04-02 NOTE — Progress Notes (Signed)
[facility]  PATIENT: Kristin Montes DOB: 1975-09-10  REFERRING CLINICIAN: Conley Rolls, [place], OD HISTORY FROM: Patient  REASON FOR VISIT: Headaches    HISTORICAL  CHIEF COMPLAINT:  Chief Complaint  Patient presents with   New Patient (Initial Visit)    Rm 12, alone, reports 4-5 headaches a week, pt currently does not have pcp     HISTORY OF PRESENT ILLNESS:  This is a 45 year old woman with past medical history of cervicalgia who is presenting for new onset headache.  Patient stated that she said she has been having terrible headache started for the past 5 months.  Patient describes headache as sharp pain located on the top of the head, she has tried Tylenol and Aleve and is not working at this moment.  Sometimes the headache can be so painful that it will bring tears to my eye, on average she will have 5 headache days per week and the headache can last the whole day.  She reported this pain is totally different from cervical hip pain she is being followed by pain management and she is scheduled to have a ESI next week.  She denies any family history of migraines, denies any photophobia or phonophobia, no nausea no vomiting.  However she does say sometimes with a headache she may have slight blurred vision   Headache History and Characteristics: Onset: 5 months ago Location: Top of head Quality: Sharp  Intensity: 10/10.  Duration: Can last the whole day Migrainous Features: None   Aura: No  History of brain injury or tumor: No  Family history: None  Motion sickness: no Cardiac history: no  OTC: tylenol, aleve  Caffeine: coffee Sleep: Not good, getting 4 hrs, will wake up in the middle of the night and unable to fall back asleep  Mood/ Stress: Very stress because of her cervical pain and she is out of work since March   Prior prophylaxis: Propranolol: No  Verapamil:No TCA: No Topamax: No Depakote: No Effexor: No Cymbalta: No Neurontin:No  Prior  abortives: Triptan: No Anti-emetic: No Steroids: No Ergotamine suppository: No  Prior interventions: None   OTHER MEDICAL CONDITIONS: Cervicalgia    REVIEW OF SYSTEMS: Full 14 system review of systems performed and negative with exception of: as noted in the HPI  ALLERGIES: No Known Allergies  HOME MEDICATIONS: Outpatient Medications Prior to Visit  Medication Sig Dispense Refill   methocarbamol (ROBAXIN) 500 MG tablet Take 1 tablet (500 mg total) by mouth every 8 (eight) hours as needed for muscle spasms. 90 tablet 1   lidocaine (LIDODERM) 5 % Place 1 patch onto the skin daily. Remove & Discard patch within 12 hours or as directed by MD 15 patch 0   No facility-administered medications prior to visit.    PAST MEDICAL HISTORY: History reviewed. No pertinent past medical history.  PAST SURGICAL HISTORY: Past Surgical History:  Procedure Laterality Date   HERNIA REPAIR     NEPHRECTOMY     TUBAL LIGATION      FAMILY HISTORY: History reviewed. No pertinent family history.  SOCIAL HISTORY: Social History   Socioeconomic History   Marital status: Single    Spouse name: Not on file   Number of children: Not on file   Years of education: Not on file   Highest education level: Not on file  Occupational History   Not on file  Tobacco Use   Smoking status: Every Day    Types: Cigarettes   Smokeless tobacco: Never  Tobacco comments:    6 cigs/day  Substance and Sexual Activity   Alcohol use: Not Currently    Comment: occasionally   Drug use: Yes    Types: Marijuana   Sexual activity: Not on file  Other Topics Concern   Not on file  Social History Narrative   Not on file   Social Determinants of Health   Financial Resource Strain: Not on file  Food Insecurity: Not on file  Transportation Needs: Not on file  Physical Activity: Not on file  Stress: Not on file  Social Connections: Not on file  Intimate Partner Violence: Not on file     PHYSICAL  EXAM  GENERAL EXAM/CONSTITUTIONAL: Vitals:  Vitals:   04/02/21 1006  BP: 125/81  Pulse: 66  Weight: 151 lb (68.5 kg)  Height: 5\' 4"  (1.626 m)   Body mass index is 25.92 kg/m. Wt Readings from Last 3 Encounters:  04/02/21 151 lb (68.5 kg)  02/10/21 153 lb (69.4 kg)  01/20/21 152 lb (68.9 kg)   Patient is in no distress; well developed, nourished and groomed; neck is supple  EYES: Pupils round and reactive to light, Visual fields full to confrontation, Extraocular movements intacts,   MUSCULOSKELETAL: Gait, strength, tone, movements noted in Neurologic exam below  NEUROLOGIC: MENTAL STATUS:  awake, alert, oriented to person, place and time recent and remote memory intact normal attention and concentration language fluent, comprehension intact, naming intact fund of knowledge appropriate  CRANIAL NERVE:  2nd - no papilledema or hemorrhages on fundoscopic exam 2nd, 3rd, 4th, 6th - pupils equal and reactive to light, visual fields full to confrontation, extraocular muscles intact, no nystagmus 5th - facial sensation symmetric 7th - facial strength symmetric 8th - hearing intact 9th - palate elevates symmetrically, uvula midline 11th - shoulder shrug symmetric 12th - tongue protrusion midline  MOTOR:  normal bulk and tone, full strength in the BUE, BLE  SENSORY:  normal and symmetric to light touch, pinprick, temperature, vibration  COORDINATION:  finger-nose-finger, fine finger movements normal  REFLEXES:  deep tendon reflexes present and symmetric  GAIT/STATION:  normal   DIAGNOSTIC DATA (LABS, IMAGING, TESTING) - I reviewed patient records, labs, notes, testing and imaging myself where available.  Lab Results  Component Value Date   WBC 6.4 10/14/2020   HGB 15.4 (H) 10/14/2020   HCT 46.1 (H) 10/14/2020   MCV 95.8 10/14/2020   PLT 178 10/14/2020      Component Value Date/Time   NA 137 10/14/2020 1610   K 4.3 10/14/2020 1610   CL 104 10/14/2020  1610   CO2 27 10/14/2020 1610   GLUCOSE 95 10/14/2020 1610   BUN 15 10/14/2020 1610   CREATININE 0.92 10/14/2020 1610   CALCIUM 8.9 10/14/2020 1610   PROT 7.2 10/14/2020 1610   ALBUMIN 3.9 10/14/2020 1610   AST 18 10/14/2020 1610   ALT 27 10/14/2020 1610   ALKPHOS 83 10/14/2020 1610   BILITOT 0.8 10/14/2020 1610   GFRNONAA >60 10/14/2020 1610   GFRAA >90 12/16/2013 1440   No results found for: CHOL, HDL, LDLCALC, LDLDIRECT, TRIG, CHOLHDL No results found for: 02/15/2014 No results found for: VITAMINB12 No results found for: TSH    ASSESSMENT AND PLAN  45 y.o. year old female with past medical history of cervicalgia who is presenting with new onset headaches for the past 5 months.  Patient stated headache is located on top of her head, describes it as sharp pain and states that it is  different  from her cervicalgia pain.  She is currently having 5 headache days per week.  She has tried ibuprofen and Tylenol without any relief.  Never tried a preventive medication.  At this point time I believe the patient have chronic tension type headache, I will start her on amitriptyline as preventive medication as she is also complaining of trouble sleeping.  I will give her combination of sumatriptan and naproxen to use for severe headache.  I will also give a short supply of Flexeril because it did help with her neck muscle spasm.  I will also order a brain MRI to rule out any intracranial etiology for headache.  I will contact the patient to discuss the MRI results otherwise I will see her in 72-month for follow-up.   1. Chronic tension-type headache, intractable   2. Cervicalgia     PLAN: MRI Brain with and without contrast  Start Elavil 25 mg 1/2 tab nightly for one week then increase to one tab nightly  Start Flexeril as needed for the muscle spasm (Cervicalgia)  Take one tab of Sumatriptan together with 1 tab of Naproxen with severe headaches, can take a second dose of Sumatriptan after 2  hours if headaches not resolved.  Return if 3 months for follow up.    Orders Placed This Encounter  Procedures   MR BRAIN W WO CONTRAST    Meds ordered this encounter  Medications   amitriptyline (ELAVIL) 25 MG tablet    Sig: Take 1 tablet (25 mg total) by mouth at bedtime.    Dispense:  30 tablet    Refill:  0   cyclobenzaprine (FLEXERIL) 10 MG tablet    Sig: Take 1 tablet (10 mg total) by mouth 3 (three) times daily as needed for muscle spasms.    Dispense:  30 tablet    Refill:  0   SUMAtriptan (IMITREX) 50 MG tablet    Sig: Take 1 tablet (50 mg total) by mouth every 2 (two) hours as needed for migraine. May repeat in 2 hours if headache persists or recurs.    Dispense:  10 tablet    Refill:  0   naproxen (NAPROSYN) 500 MG tablet    Sig: Take 1 tablet (500 mg total) by mouth every 12 (twelve) hours as needed for headache.    Dispense:  30 tablet    Refill:  0    Return in about 3 months (around 07/02/2021).    [name], MD 04/02/2021, 10:38 AM  [facility] [address] [place], [place] [address] [phone]

## 2021-04-02 NOTE — Patient Instructions (Signed)
MRI Brain with and without contrast  Start Elavil 25 mg 1/2 tab nightly for one week then increase to one tab nightly  Start Flexeril as needed for the muscle spasm (Cervicalgia)  Take one tab of Sumatriptan together with 1 tab of Naproxen with severe headaches, can take a second dose of Sumatriptan after 2 hours if headaches not resolved.  Return if 3 months for follow up.

## 2021-04-03 ENCOUNTER — Other Ambulatory Visit: Payer: Self-pay | Admitting: Orthopaedic Surgery

## 2021-04-07 ENCOUNTER — Encounter: Payer: Medicaid Other | Attending: Physical Medicine & Rehabilitation | Admitting: Physical Medicine & Rehabilitation

## 2021-04-07 ENCOUNTER — Encounter: Payer: Self-pay | Admitting: Physical Medicine & Rehabilitation

## 2021-04-07 ENCOUNTER — Telehealth: Payer: Self-pay | Admitting: Neurology

## 2021-04-07 ENCOUNTER — Other Ambulatory Visit: Payer: Self-pay

## 2021-04-07 VITALS — BP 129/85 | HR 89 | Temp 99.0°F | Ht 64.0 in | Wt 150.6 lb

## 2021-04-07 DIAGNOSIS — M5010 Cervical disc disorder with radiculopathy, unspecified cervical region: Secondary | ICD-10-CM | POA: Insufficient documentation

## 2021-04-07 DIAGNOSIS — M25519 Pain in unspecified shoulder: Secondary | ICD-10-CM | POA: Diagnosis not present

## 2021-04-07 NOTE — Telephone Encounter (Signed)
[facility] healthy blue pending. Uploaded notes on the portal.

## 2021-04-07 NOTE — Patient Instructions (Addendum)
Dr Ophelia Charter assistant is recommending cervical epidural steroid injections, which we don't do here,  I will have you see Dr Alvester Morin at [facility] for this

## 2021-04-07 NOTE — Progress Notes (Signed)
Subjective:    Patient ID: Kristin Montes, female    DOB: 1976/02/18, 45 y.o.   MRN: [id number]  45yo female with a hx of R>L posterior neck which started in March, pt denies accident or injury   Pain is intermittent but worsens with activity such as house work. Takes ~ 6Tylenol per day as well as ~10 tabs ibuprofen per day   Mostly stands at work   Has occ numbess of fingers but affects different fingers No weakness in arms  Last PT session July 20th, 2022, has additional sessions scheduled.  The patient has seen orthospine, Dr. Ophelia Charter who ordered an MRI of the cervical spine and is considering performing C-spine surgery. HPI  CC:  Neck pain and interscapular pain   45 year old female with primary complaint of neck pain and interscapular pain as well as upper extremity tingling.  She had an MRI performed on 10/02/2020 that showed foraminal stenosis bilateral C5-6 and C6-7 as well as left C4-5 Patient also complains of shoulder numbness. She was referred by Ortho for cervical ESI  CLINICAL DATA:  Neck pain, right radiculopathy   EXAM: MRI CERVICAL SPINE WITHOUT CONTRAST   TECHNIQUE: Multiplanar, multisequence MR imaging of the cervical spine was performed. No intravenous contrast was administered.   COMPARISON:  None.   FINDINGS: Motion artifact is present.   Alignment: Trace retrolisthesis at C4-C5.   Vertebrae: Mild degenerative endplate irregularity and marrow changes. No suspicious osseous lesion.   Cord: No abnormal signal.   Posterior Fossa, vertebral arteries, paraspinal tissues: Subcentimeter right thyroid nodule for which no ultrasound follow-up is recommended by current guidelines. Otherwise unremarkable.   Disc levels:   C2-C3:  No canal or foraminal stenosis.   C3-C4: Disc bulge with endplate osteophytic ridging and uncovertebral hypertrophy. No canal or foraminal stenosis.   C4-C5: Disc bulge with endplate osteophytic ridging and uncovertebral  hypertrophy. Mild canal stenosis. Mild right and marked left foraminal stenosis.   C5-C6: Disc bulge with endplate osteophytic ridging and uncovertebral hypertrophy. No canal stenosis. Marked foraminal stenosis.   C6-C7: Disc bulge with endplate osteophytic ridging and uncovertebral hypertrophy. No canal stenosis. Marked foraminal stenosis.   C7-T1:  No canal or foraminal stenosis.   IMPRESSION: Multilevel degenerative changes as detailed above. There is no high-grade canal stenosis. Foraminal narrowing is greatest from C4-C5 through C6-C7.     Electronically Signed   By: Guadlupe Spanish M.D.   On: 10/02/2020 12:58 Pain Inventory Average Pain 9 Pain Right Now 8 My pain is intermittent, sharp, stabbing, and aching  In the last 24 hours, has pain interfered with the following? General activity 2 Relation with others 4 Enjoyment of life 4 What TIME of day is your pain at its worst? morning , daytime, evening, and night Sleep (in general) Fair  Pain is worse with: walking, bending, and standing Pain improves with: medication Relief from Meds: 8  No family history on file. Social History   Socioeconomic History   Marital status: Single    Spouse name: Not on file   Number of children: Not on file   Years of education: Not on file   Highest education level: Not on file  Occupational History   Not on file  Tobacco Use   Smoking status: Every Day    Types: Cigarettes   Smokeless tobacco: Never   Tobacco comments:    6 cigs/day  Vaping Use   Vaping Use: Never used  Substance and Sexual Activity   Alcohol use: Not  Currently    Comment: occasionally   Drug use: Yes    Types: Marijuana   Sexual activity: Not on file  Other Topics Concern   Not on file  Social History Narrative   Not on file   Social Determinants of Health   Financial Resource Strain: Not on file  Food Insecurity: Not on file  Transportation Needs: Not on file  Physical Activity: Not on file   Stress: Not on file  Social Connections: Not on file   Past Surgical History:  Procedure Laterality Date   HERNIA REPAIR     NEPHRECTOMY     TUBAL LIGATION     Past Surgical History:  Procedure Laterality Date   HERNIA REPAIR     NEPHRECTOMY     TUBAL LIGATION     History reviewed. No pertinent past medical history. BP 129/85   Pulse 89   Temp 99 F (37.2 C)   Ht 5\' 4"  (1.626 m)   Wt 150 lb 9.6 oz (68.3 kg)   SpO2 95%   BMI 25.85 kg/m   Opioid Risk Score:   Fall Risk Score:  `1  Depression screen PHQ 2/9  Depression screen [facility] 2/9 02/10/2021 12/15/2020 12/02/2020  Decreased Interest 0 1 2  Down, Depressed, Hopeless 2 1 2   PHQ - 2 Score 2 2 4   Altered sleeping 0 2 0  Tired, decreased energy 3 3 3   Change in appetite 1 1 0  Feeling bad or failure about yourself  3 2 0  Trouble concentrating 1 2 0  Moving slowly or fidgety/restless 3 3 0  Suicidal thoughts 1 1 0  PHQ-9 Score 14 16 7     Review of Systems  Musculoskeletal:  Positive for back pain and neck pain.       Right arm pain  All other systems reviewed and are negative.     Objective:   Physical Exam Vitals and nursing note reviewed.  Constitutional:      Appearance: She is normal weight.  HENT:     Head: Normocephalic and atraumatic.  Eyes:     Extraocular Movements: Extraocular movements intact.     Conjunctiva/sclera: Conjunctivae normal.     Pupils: Pupils are equal, round, and reactive to light.  Neck:     Comments: Patient has pain with cervical spine flexion but not with extension.  Has normal lateral bending and rotation.  Negative Spurling's Musculoskeletal:     Comments: Tenderness to palpation bilateral upper trapezius as well as bilateral levator scapula  Skin:    General: Skin is warm and dry.  Neurological:     Mental Status: She is alert and oriented to person, place, and time.     Comments: Motor strength is 5/5 bilateral deltoid, bicep, tricep, grip 5/5 bilateral hip flexor knee  extensor ankle dorsiflexor Ambulates without evidence of toe drag or knee instability.  Psychiatric:        Mood and Affect: Mood normal.        Behavior: Behavior normal.   No evidence of APB atrophy bilaterally.       Assessment & Plan:  #1.  Cervical spinal stenosis mainly foraminal stenosis.  Finger tingling likely radicular.  She does not have any significant nocturnal symptoms in her hands no pain with driving do not think she needs an EMG at this time. She does have myofascial component to her pain and certainly has some tenderness bilateral upper trap and levator scapula.  Would benefit from trigger point  injection for this. In terms of her radicular discomfort Ortho spine is recommending cervical ESI.  I explained patient that we do not do this at this clinic but will make referral to Dr. Alvester Morin. Trigger Point Injection  Indication: Bilateral trapezius and bilateral levator scapula myofascial pain not relieved by medication management and other conservative care.  Informed consent was obtained after describing risk and benefits of the procedure with the patient, this includes bleeding, bruising, infection and medication side effects.  The patient wishes to proceed and has given written consent.  The patient was placed in a seated position.  The bilateral levator bilateral upper trapezius area was marked and prepped with Betadine.  It was entered with a 25-gauge 1-1/2 inch needle and 1 mL of 1% lidocaine was injected into each of 4 trigger points, after negative draw back for blood.  The patient tolerated the procedure well.  Post procedure instructions were given.

## 2021-04-13 NOTE — Telephone Encounter (Signed)
Mcd healthy blue Berkley Harvey: [id number] (exp. 04/07/21 to 06/06/21) order sent to GI, they will reach out to the patient to schedule.

## 2021-04-24 ENCOUNTER — Ambulatory Visit (INDEPENDENT_AMBULATORY_CARE_PROVIDER_SITE_OTHER): Payer: Medicaid Other | Admitting: Orthopaedic Surgery

## 2021-04-24 ENCOUNTER — Other Ambulatory Visit: Payer: Self-pay

## 2021-04-24 ENCOUNTER — Encounter: Payer: Self-pay | Admitting: Orthopaedic Surgery

## 2021-04-24 VITALS — BP 148/98 | HR 84 | Ht 64.0 in | Wt 150.0 lb

## 2021-04-24 DIAGNOSIS — M4802 Spinal stenosis, cervical region: Secondary | ICD-10-CM

## 2021-04-24 NOTE — Progress Notes (Signed)
Office Visit Note   Patient: Kristin Montes           Date of Birth: Mar 28, 1976           MRN: [id number] Visit Date: 04/24/2021              Requested by: Arvilla Market, MD [address] [place],  [place] [address] PCP: Arvilla Market, MD   Assessment & Plan: Visit Diagnoses:  1. Foraminal stenosis of cervical region     Plan: We will proceed with cervical epidural with Dr. Alvester Morin she can follow-up with me after her injection.  Follow-Up Instructions: Return in about 2 months (around 06/24/2021).   Orders:  Orders Placed This Encounter  Procedures   Ambulatory referral to Physical Medicine Rehab   No orders of the defined types were placed in this encounter.     Procedures: No procedures performed   Clinical Data: No additional findings.   Subjective: Chief Complaint  Patient presents with   Neck - Pain, Follow-up    HPI 45 year old female returns.  She had been referred back to [facility] for epidural.  He states he does not do cervicals and like Dr. Alvester Morin see patient in she has an appointment Tuesday for cervical epidural.  Patient had previous trigger point injection also dry needling with therapy without relief.  She states Flexeril makes her sleepy but she can only take it at night.  She is also taken some Naprosyn.  She states she feels discomfort at the top of her shoulders.  Review of Systems 14 point review of systems updated unchanged from 01/20/2021   Objective: Vital Signs: BP (!) 148/98   Pulse 84   Ht 5\' 4"  (1.626 m)   Wt 150 lb (68 kg)   BMI 25.75 kg/m   Physical Exam Constitutional:      Appearance: She is well-developed.  HENT:     Head: Normocephalic.     Right Ear: External ear normal.     Left Ear: External ear normal. There is no impacted cerumen.  Eyes:     Pupils: Pupils are equal, round, and reactive to light.  Neck:     Thyroid: No thyromegaly.     Trachea: No tracheal deviation.   Cardiovascular:     Rate and Rhythm: Normal rate.  Pulmonary:     Effort: Pulmonary effort is normal.  Abdominal:     Palpations: Abdomen is soft.  Musculoskeletal:     Cervical back: No rigidity.  Skin:    General: Skin is warm and dry.  Neurological:     Mental Status: She is alert and oriented to person, place, and time.  Psychiatric:        Behavior: Behavior normal.    Ortho Exam patient has brachial plexus tenderness.  More symptomatic and tender on the right than the left.  Symmetrical reflexes normal lower extremity gait.  Ulnar nerve at the elbow is normal.  No lower extremity clonus.  Specialty Comments:  No specialty comments available.  Imaging: No results found.   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  No family history on file.  Past Surgical History:  Procedure Laterality Date   HERNIA REPAIR     NEPHRECTOMY     TUBAL LIGATION     Social History  Occupational History   Not on file  Tobacco Use   Smoking status: Every Day    Types: Cigarettes   Smokeless tobacco: Never   Tobacco comments:    6 cigs/day  Vaping Use   Vaping Use: Never used  Substance and Sexual Activity   Alcohol use: Not Currently    Comment: occasionally   Drug use: Yes    Types: Marijuana   Sexual activity: Not on file

## 2021-04-25 ENCOUNTER — Ambulatory Visit
Admission: RE | Admit: 2021-04-25 | Discharge: 2021-04-25 | Disposition: A | Discharge status: Home / Self Care | Payer: Medicaid Other | Source: Ambulatory Visit | Attending: Neurology | Admitting: Neurology

## 2021-04-25 DIAGNOSIS — G44221 Chronic tension-type headache, intractable: Secondary | ICD-10-CM

## 2021-04-25 MED ORDER — GADOBENATE DIMEGLUMINE 529 MG/ML IV SOLN
15.0000 mL | Freq: Once | INTRAVENOUS | Status: AC | PRN
  Administered 2021-04-25: 15 mL via INTRAVENOUS

## 2021-04-28 ENCOUNTER — Other Ambulatory Visit: Payer: Self-pay

## 2021-04-28 ENCOUNTER — Ambulatory Visit: Payer: Self-pay

## 2021-04-28 ENCOUNTER — Ambulatory Visit (INDEPENDENT_AMBULATORY_CARE_PROVIDER_SITE_OTHER): Payer: Medicaid Other | Admitting: Physical Medicine and Rehabilitation

## 2021-04-28 ENCOUNTER — Encounter: Payer: Self-pay | Admitting: Physical Medicine and Rehabilitation

## 2021-04-28 VITALS — BP 146/94 | HR 91

## 2021-04-28 DIAGNOSIS — M5010 Cervical disc disorder with radiculopathy, unspecified cervical region: Secondary | ICD-10-CM

## 2021-04-28 IMAGING — DX DG SHOULDER 2+V*R*
3 series · 3 of 3 positions shown · non-contrast
Comparison: No prior.

CLINICAL DATA: Right shoulder pain.

EXAM:
RIGHT SHOULDER - 2+ VIEW

[w shoulder external right]
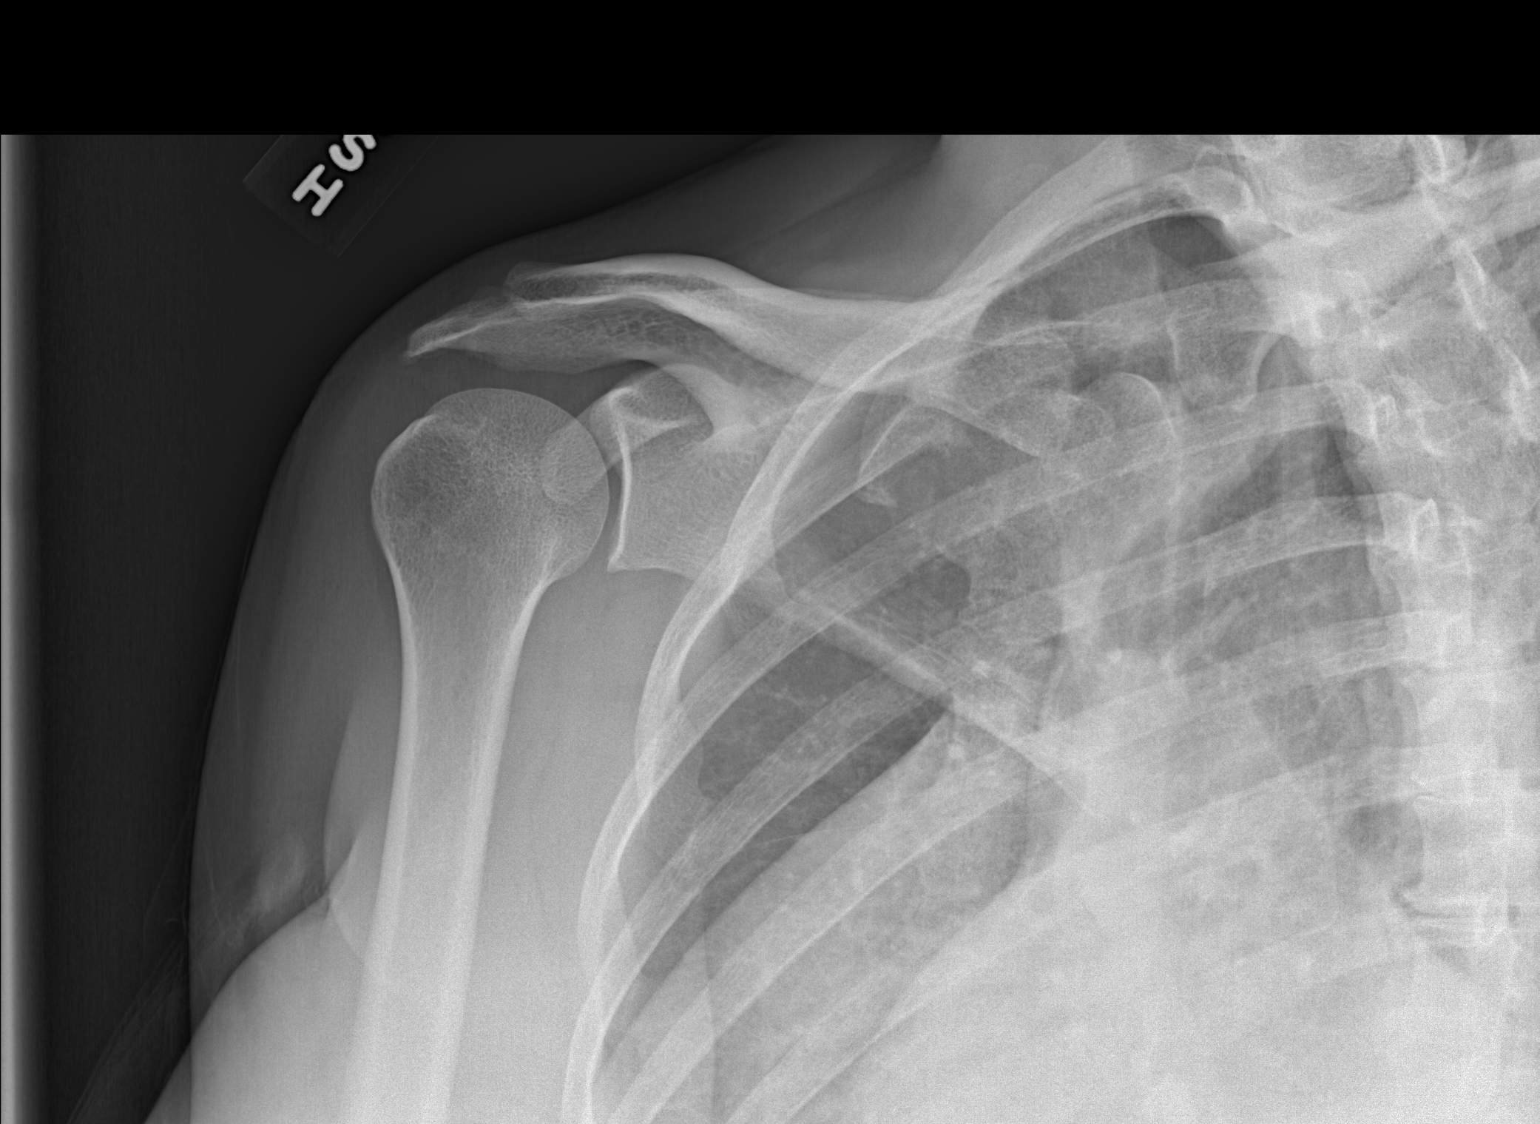

[w shoulder y-view right]
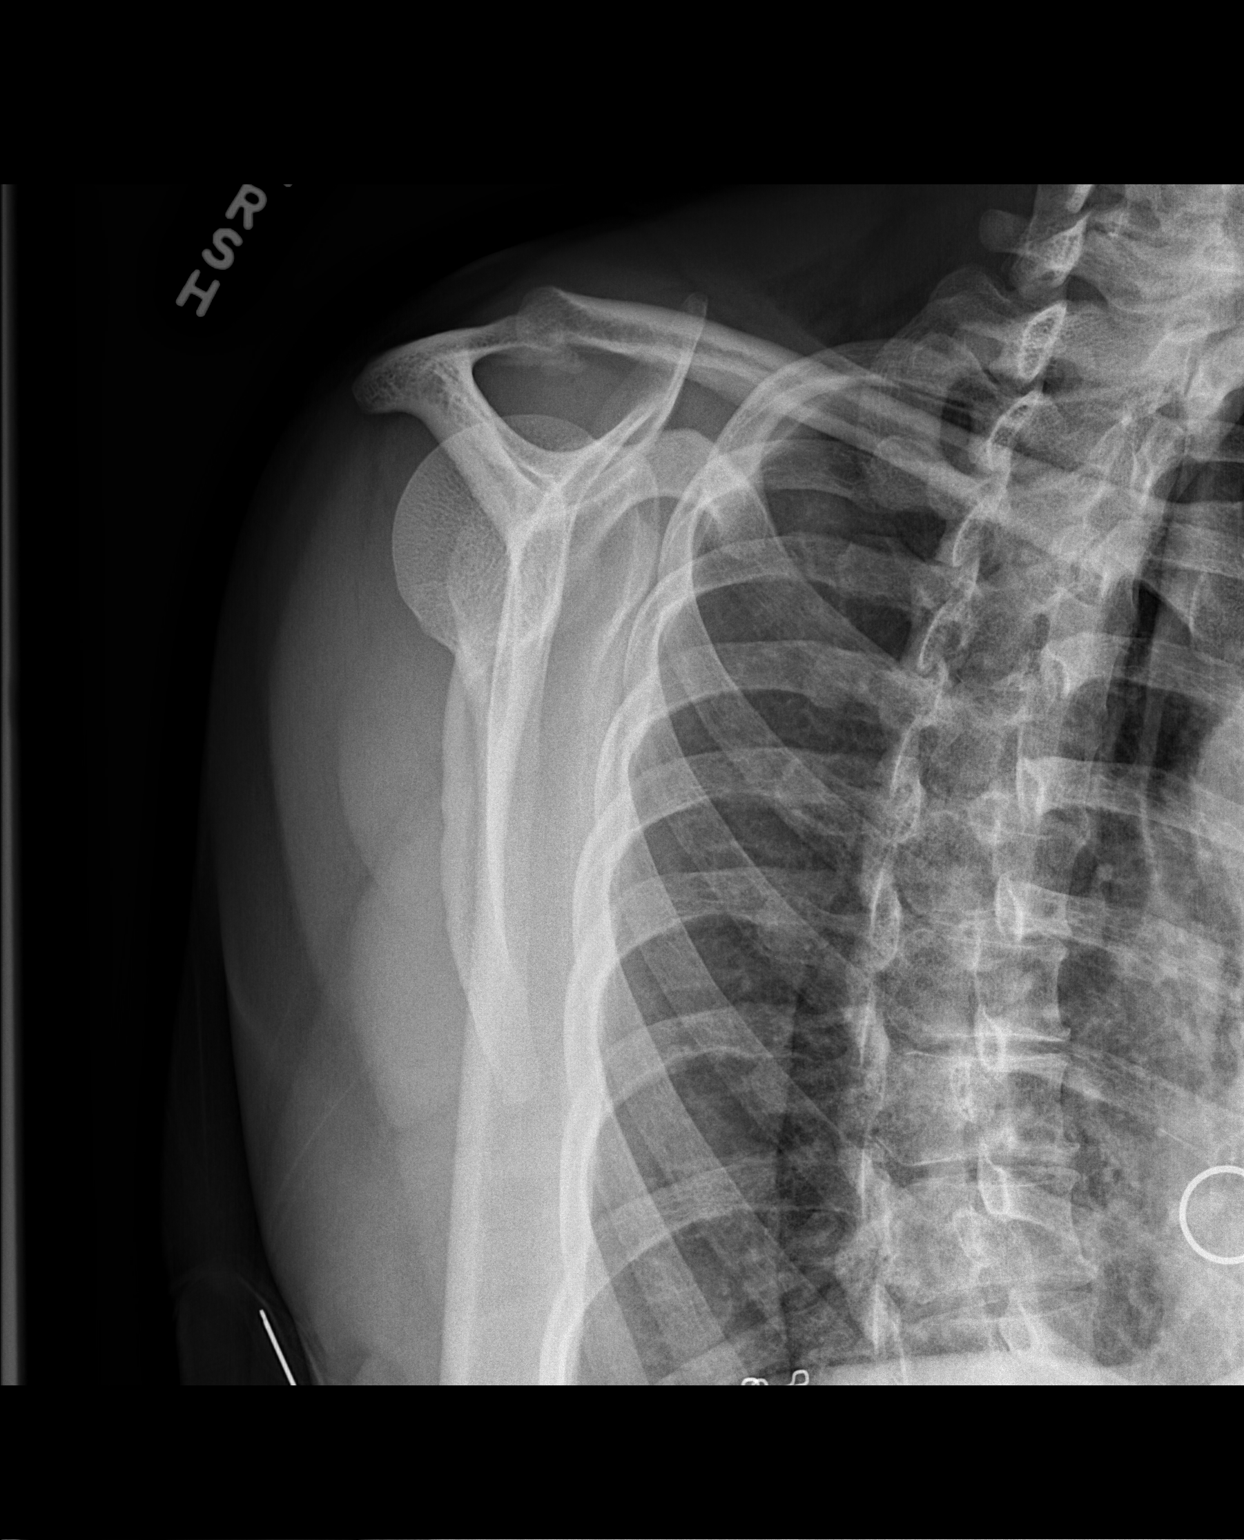

[x shoulder axillary right]
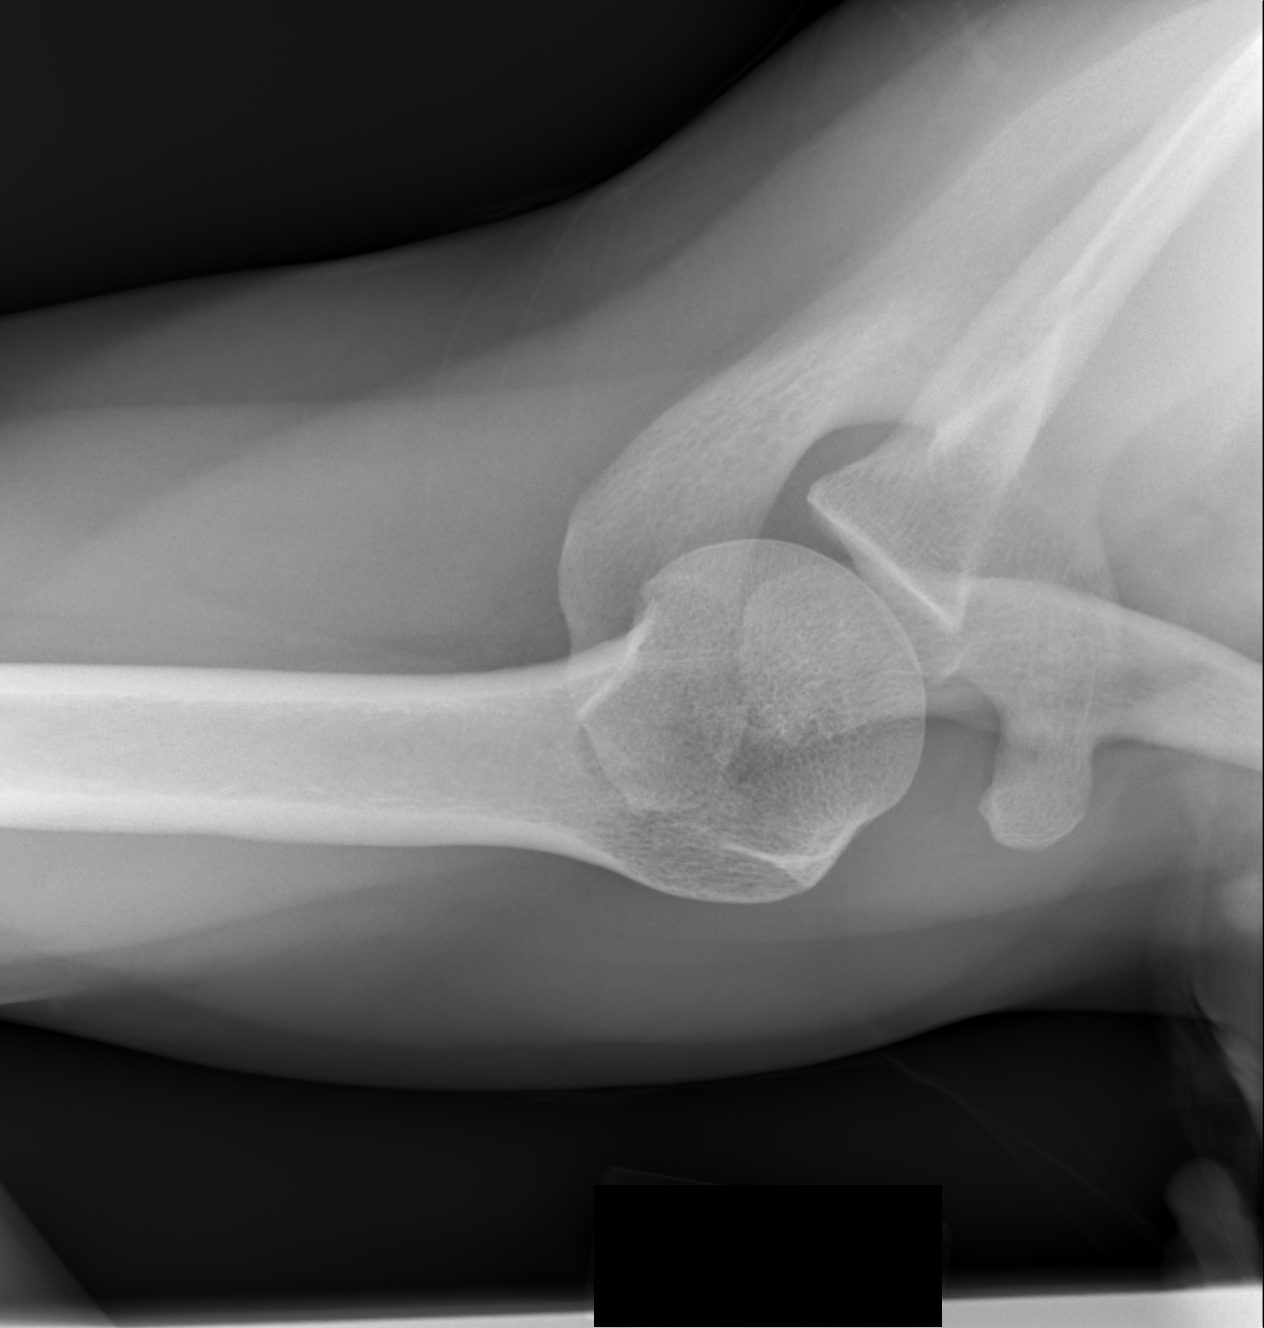

[3 of 3 positions shown; findings below may reference images not displayed]

FINDINGS: No acute bony or joint abnormality. No evidence of fracture or
dislocation. No evidence of separation.
IMPRESSION: No acute abnormality.

## 2021-04-28 MED ORDER — BETAMETHASONE SOD PHOS & ACET 6 (3-3) MG/ML IJ SUSP
12.0000 mg | Freq: Once | INTRAMUSCULAR | Status: AC
  Administered 2021-04-28: 12 mg

## 2021-04-28 NOTE — Patient Instructions (Signed)

## 2021-04-28 NOTE — Progress Notes (Signed)
Pt state neck pain that travels to her right shoulder to her elbow. Pt state she also feel pain across her chest. Pt state the cold weather , turning her head and lifting makes the pain worse. Pt state she takes pain meds and uses heat to help ease her pain  Numeric Pain Rating Scale and Functional Assessment Average Pain 8   In the last MONTH (on 0-10 scale) has pain interfered with the following?  1. General activity like being  able to carry out your everyday physical activities such as walking, climbing stairs, carrying groceries, or moving a chair?  Rating(10)   +Driver, -BT, -Dye Allergies.

## 2021-05-05 ENCOUNTER — Telehealth: Payer: Self-pay | Admitting: Neurology

## 2021-05-05 MED ORDER — AMITRIPTYLINE HCL 25 MG PO TABS: 25.0000 mg | ORAL_TABLET | Freq: Every day | ORAL | 2 refills | Status: DC

## 2021-05-05 NOTE — Telephone Encounter (Signed)
Pt request refill for amitriptyline (ELAVIL) 25 MG tablet at [facility] [phone]

## 2021-05-05 NOTE — Telephone Encounter (Signed)
Amitriptyline 25mg  [facility] refilled.

## 2021-05-08 ENCOUNTER — Telehealth: Payer: Self-pay | Admitting: Orthopaedic Surgery

## 2021-05-08 NOTE — Telephone Encounter (Signed)
Please advise patient work status. We received disability forms but, I do not see where she was taken oow. Thanks.

## 2021-05-11 NOTE — Telephone Encounter (Signed)
Noted on forms "patient has not been taken out of work" and faxed back

## 2021-05-11 NOTE — Progress Notes (Signed)
Kristin Montes - 45 y.o. female MRN [id number]  Date of birth: [birth year]  Office Visit Note: Visit Date: 04/28/2021 PCP: Arvilla Market, MD Referred by: Leary Roca*  Subjective: Chief Complaint  Patient presents with   Neck - Pain   Right Shoulder - Pain   HPI:  Kristin Montes is a 45 y.o. female who comes in today at the request of Dr. Annell Greening for planned Right C7-T1 Cervical Interlaminar epidural steroid injection with fluoroscopic guidance.  The patient has failed conservative care including home exercise, medications, time and activity modification.  This injection will be diagnostic and hopefully therapeutic.  Please see requesting physician notes for further details and justification. MRI reviewed with images and spine model.  MRI reviewed in the note below.   Also [facility] treated by Dr. Claudette Laws who had also placed request for cervical epidural at some point.    ROS Otherwise per HPI.  Assessment & Plan: Visit Diagnoses:    ICD-10-CM   1. Cervical disc disorder with radiculopathy of cervical region  M50.10 XR C-ARM NO REPORT    Epidural Steroid injection    betamethasone acetate-betamethasone sodium phosphate (CELESTONE) injection 12 mg      Plan: No additional findings.   Meds & Orders:  Meds ordered this encounter  Medications   betamethasone acetate-betamethasone sodium phosphate (CELESTONE) injection 12 mg    Orders Placed This Encounter  Procedures   XR C-ARM NO REPORT   Epidural Steroid injection    Follow-up: Return for visit to requesting physician as needed.   Procedures: No procedures performed  Cervical Epidural Steroid Injection - Interlaminar Approach with Fluoroscopic Guidance  Patient: Kristin Montes      Date of Birth: 10-08-1975 MRN: [id number] PCP: Arvilla Market, MD      Visit Date: 04/28/2021   Universal Protocol:    Date/Time: 05/12/2211:45 PM  Consent Given By: the patient  Position:  PRONE  Additional Comments: Vital signs were monitored before and after the procedure. Patient was prepped and draped in the usual sterile fashion. The correct patient, procedure, and site was verified.   Injection Procedure Details:   Procedure diagnoses: Cervical disc disorder with radiculopathy of cervical region [M50.10]    Meds Administered:  Meds ordered this encounter  Medications   betamethasone acetate-betamethasone sodium phosphate (CELESTONE) injection 12 mg     Laterality: Right  Location/Site: C7-T1  Needle: 3.5 in., 20 ga. Tuohy  Needle Placement: Paramedian epidural space  Findings:  -Comments: Excellent flow of contrast into the epidural space.  Procedure Details: Using a paramedian approach from the side mentioned above, the region overlying the inferior lamina was localized under fluoroscopic visualization and the soft tissues overlying this structure were infiltrated with 4 ml. of 1% Lidocaine without Epinephrine. A # 20 gauge, Tuohy needle was inserted into the epidural space using a paramedian approach.  The epidural space was localized using loss of resistance along with contralateral oblique bi-planar fluoroscopic views.  After negative aspirate for air, blood, and CSF, a 2 ml. volume of Isovue-250 was injected into the epidural space and the flow of contrast was observed. Radiographs were obtained for documentation purposes.   The injectate was administered into the level noted above.  Additional Comments:  The patient tolerated the procedure well Dressing: 2 x 2 sterile gauze and Band-Aid    Post-procedure details: Patient was observed during the procedure. Post-procedure instructions were reviewed.  Patient left the clinic in stable condition.   Clinical History: MRI  CERVICAL SPINE WITHOUT CONTRAST   TECHNIQUE: Multiplanar, multisequence MR imaging of the cervical spine was performed. No intravenous contrast was administered.    COMPARISON:  None.   FINDINGS: Motion artifact is present.   Alignment: Trace retrolisthesis at C4-C5.   Vertebrae: Mild degenerative endplate irregularity and marrow changes. No suspicious osseous lesion.   Cord: No abnormal signal.   Posterior Fossa, vertebral arteries, paraspinal tissues: Subcentimeter right thyroid nodule for which no ultrasound follow-up is recommended by current guidelines. Otherwise unremarkable.   Disc levels:   C2-C3:  No canal or foraminal stenosis.   C3-C4: Disc bulge with endplate osteophytic ridging and uncovertebral hypertrophy. No canal or foraminal stenosis.   C4-C5: Disc bulge with endplate osteophytic ridging and uncovertebral hypertrophy. Mild canal stenosis. Mild right and marked left foraminal stenosis.   C5-C6: Disc bulge with endplate osteophytic ridging and uncovertebral hypertrophy. No canal stenosis. Marked foraminal stenosis.   C6-C7: Disc bulge with endplate osteophytic ridging and uncovertebral hypertrophy. No canal stenosis. Marked foraminal stenosis.   C7-T1:  No canal or foraminal stenosis.   IMPRESSION: Multilevel degenerative changes as detailed above. There is no high-grade canal stenosis. Foraminal narrowing is greatest from C4-C5 through C6-C7.     Electronically Signed   By: Macy Mis M.D.   On: 10/02/2020 12:58     Objective:  VS:  HT:    WT:   BMI:     BP:(!) 146/94  HR:91bpm  TEMP: ( )  RESP:  Physical Exam Vitals and nursing note reviewed.  Constitutional:      General: She is not in acute distress.    Appearance: Normal appearance. She is not ill-appearing.  HENT:     Head: Normocephalic and atraumatic.     Right Ear: External ear normal.     Left Ear: External ear normal.  Eyes:     Extraocular Movements: Extraocular movements intact.  Cardiovascular:     Rate and Rhythm: Normal rate.     Pulses: Normal pulses.  Musculoskeletal:     Cervical back: Tenderness present. No rigidity.      Right lower leg: No edema.     Left lower leg: No edema.     Comments: Patient has good strength in the upper extremities including 5 out of 5 strength in wrist extension long finger flexion and APB.  There is no atrophy of the hands intrinsically.  There is a negative Hoffmann's test.   Lymphadenopathy:     Cervical: No cervical adenopathy.  Skin:    Findings: No erythema, lesion or rash.  Neurological:     General: No focal deficit present.     Mental Status: She is alert and oriented to person, place, and time.     Sensory: No sensory deficit.     Motor: No weakness or abnormal muscle tone.     Coordination: Coordination normal.  Psychiatric:        Mood and Affect: Mood normal.        Behavior: Behavior normal.     Imaging: No results found.

## 2021-05-11 NOTE — Procedures (Signed)
Cervical Epidural Steroid Injection - Interlaminar Approach with Fluoroscopic Guidance  Patient: Kristin Montes      Date of Birth: [birth year] MRN: [id number] PCP: Arvilla Market, MD      Visit Date: 04/28/2021   Universal Protocol:    Date/Time: 05/12/2211:45 PM  Consent Given By: the patient  Position: PRONE  Additional Comments: Vital signs were monitored before and after the procedure. Patient was prepped and draped in the usual sterile fashion. The correct patient, procedure, and site was verified.   Injection Procedure Details:   Procedure diagnoses: Cervical disc disorder with radiculopathy of cervical region [M50.10]    Meds Administered:  Meds ordered this encounter  Medications   betamethasone acetate-betamethasone sodium phosphate (CELESTONE) injection 12 mg     Laterality: Right  Location/Site: C7-T1  Needle: 3.5 in., 20 ga. Tuohy  Needle Placement: Paramedian epidural space  Findings:  -Comments: Excellent flow of contrast into the epidural space.  Procedure Details: Using a paramedian approach from the side mentioned above, the region overlying the inferior lamina was localized under fluoroscopic visualization and the soft tissues overlying this structure were infiltrated with 4 ml. of 1% Lidocaine without Epinephrine. A # 20 gauge, Tuohy needle was inserted into the epidural space using a paramedian approach.  The epidural space was localized using loss of resistance along with contralateral oblique bi-planar fluoroscopic views.  After negative aspirate for air, blood, and CSF, a 2 ml. volume of Isovue-250 was injected into the epidural space and the flow of contrast was observed. Radiographs were obtained for documentation purposes.   The injectate was administered into the level noted above.  Additional Comments:  The patient tolerated the procedure well Dressing: 2 x 2 sterile gauze and Band-Aid    Post-procedure details: Patient was  observed during the procedure. Post-procedure instructions were reviewed.  Patient left the clinic in stable condition.

## 2021-05-19 ENCOUNTER — Other Ambulatory Visit: Payer: Self-pay

## 2021-05-19 ENCOUNTER — Encounter: Payer: Self-pay | Admitting: Nurse Practitioner

## 2021-05-19 ENCOUNTER — Ambulatory Visit (INDEPENDENT_AMBULATORY_CARE_PROVIDER_SITE_OTHER): Payer: Medicaid Other

## 2021-05-19 ENCOUNTER — Ambulatory Visit (INDEPENDENT_AMBULATORY_CARE_PROVIDER_SITE_OTHER): Payer: Medicaid Other | Admitting: Nurse Practitioner

## 2021-05-19 ENCOUNTER — Telehealth: Payer: Self-pay

## 2021-05-19 VITALS — BP 126/86 | HR 77 | Resp 18

## 2021-05-19 DIAGNOSIS — G8929 Other chronic pain: Secondary | ICD-10-CM | POA: Diagnosis not present

## 2021-05-19 DIAGNOSIS — M25552 Pain in left hip: Secondary | ICD-10-CM | POA: Diagnosis not present

## 2021-05-19 DIAGNOSIS — M4802 Spinal stenosis, cervical region: Secondary | ICD-10-CM | POA: Diagnosis not present

## 2021-05-19 MED ORDER — PREDNISONE 20 MG PO TABS: 20.0000 mg | ORAL_TABLET | Freq: Every day | ORAL | 0 refills | Status: AC

## 2021-05-19 NOTE — Assessment & Plan Note (Signed)
Will order x ray of left hip  Rest hip when possible  May try ice packs   Cervical foraminal stenosis:  Will place referral for second opinion for neck pain  Follow up:  Follow up in 4-6 weeks to establish care with Dr. Andrey Campanile

## 2021-05-19 NOTE — Telephone Encounter (Signed)
Pt called in requesting to be referred to a different ortho office, due to her being seen by that provider in the past and being unsatisfied with the care she received

## 2021-05-19 NOTE — Patient Instructions (Signed)
Left Hip Pain:  Will order x ray of left hip  Rest hip when possible  May try ice packs   Cervical foraminal stenosis:  Will place referral for second opinion for neck pain  Follow up:  Follow up in 4-6 weeks to establish care with Dr. Andrey Campanile  Hip Pain The hip is the joint between the upper legs and the lower pelvis. The bones, cartilage, tendons, and muscles of your hip joint support your body and allow you to move around. Hip pain can range from a minor ache to severe pain in one or both of your hips. The pain may be felt on the inside of the hip joint near the groin, or on the outside near the buttocks and upper thigh. You may also have swelling or stiffness in your hip area. Follow these instructions at home: Managing pain, stiffness, and swelling   If directed, put ice on the painful area. To do this: Put ice in a plastic bag. Place a towel between your skin and the bag. Leave the ice on for 20 minutes, 2-3 times a day. If directed, apply heat to the affected area as often as told by your health care provider. Use the heat source that your health care provider recommends, such as a moist heat pack or a heating pad. Place a towel between your skin and the heat source. Leave the heat on for 20-30 minutes. Remove the heat if your skin turns bright red. This is especially important if you are unable to feel pain, heat, or cold. You may have a greater risk of getting burned. Activity Do exercises as told by your health care provider. Avoid activities that cause pain. General instructions  Take over-the-counter and prescription medicines only as told by your health care provider. Keep a journal of your symptoms. Write down: How often you have hip pain. The location of your pain. What the pain feels like. What makes the pain worse. Sleep with a pillow between your legs on your most comfortable side. Keep all follow-up visits as told by your health care provider. This is  important. Contact a health care provider if: You cannot put weight on your leg. Your pain or swelling continues or gets worse after one week. It gets harder to walk. You have a fever. Get help right away if: You fall. You have a sudden increase in pain and swelling in your hip. Your hip is red or swollen or very tender to touch. Summary Hip pain can range from a minor ache to severe pain in one or both of your hips. The pain may be felt on the inside of the hip joint near the groin, or on the outside near the buttocks and upper thigh. Avoid activities that cause pain. Write down how often you have hip pain, the location of the pain, what makes it worse, and what it feels like. This information is not intended to replace advice given to you by your health care provider. Make sure you discuss any questions you have with your health care provider. Document Revised: 11/13/2018 Document Reviewed: 11/13/2018 Elsevier Patient Education  2022 [employer].

## 2021-05-19 NOTE — Progress Notes (Signed)
@Patient  ID: , female    DOB: August 21, 1975, 45 y.o.   MRN: [id number]  Chief Complaint  Patient presents with   Hip Pain    Left hip pain    Referring provider: [id number]*  HPI   Patient presents today for left hip pain.  She states that this pain has been going on for the past 5 months but in the last 10 days it has progressively worsened.  She states that she feels like her hip is unstable at times.  She does have good range of motion to her left hip but it is painful with movement.  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  She would like a referral to a different orthopedic specialist for second opinion on treatment.  She is also followed by pain management for neck pain. Denies f/c/s, n/v/d, hemoptysis, PND, chest pain or edema.       No Known Allergies   There is no immunization history on file for this patient.  History reviewed. No pertinent past medical history.  Tobacco History: Social History   Tobacco Use  Smoking Status Every Day   Types: Cigarettes  Smokeless Tobacco Never  Tobacco Comments   6 cigs/day   Ready to quit: No Counseling given: Yes Tobacco comments: 6 cigs/day   Outpatient Encounter Medications as of 05/19/2021  Medication Sig   predniSONE (DELTASONE) 20 MG tablet Take 1 tablet (20 mg total) by mouth daily with breakfast for 5 days.   amitriptyline (ELAVIL) 25 MG tablet Take 1 tablet (25 mg total) by mouth at bedtime.   cyclobenzaprine (FLEXERIL) 10 MG tablet Take 1 tablet (10 mg total) by mouth 3 (three) times daily as needed for muscle spasms.   methocarbamol (ROBAXIN) 500 MG tablet Take 1 tablet (500 mg total) by mouth every 8 (eight) hours as needed for muscle spasms.   naproxen (NAPROSYN) 500 MG tablet Take 1 tablet (500 mg total) by mouth every 12 (twelve) hours as needed for headache.   SUMAtriptan (IMITREX) 50 MG tablet Take 1 tablet (50 mg  total) by mouth every 2 (two) hours as needed for migraine. May repeat in 2 hours if headache persists or recurs.   No facility-administered encounter medications on file as of 05/19/2021.     Review of Systems  Review of Systems  Constitutional: Negative.   HENT: Negative.    Cardiovascular: Negative.   Gastrointestinal: Negative.   Musculoskeletal:        Left hip pain  Allergic/Immunologic: Negative.   Neurological: Negative.   Psychiatric/Behavioral: Negative.        Physical Exam  BP 126/86   Pulse 77   Resp 18   SpO2 94%   Wt Readings from Last 5 Encounters:  04/24/21 150 lb (68 kg)  04/07/21 150 lb 9.6 oz (68.3 kg)  04/02/21 151 lb (68.5 kg)  02/10/21 153 lb (69.4 kg)  01/20/21 152 lb (68.9 kg)     Physical Exam Vitals and nursing note reviewed.  Constitutional:      General: She is not in acute distress.    Appearance: She is well-developed.  Cardiovascular:     Rate and Rhythm: Normal rate and regular rhythm.  Pulmonary:     Effort: Pulmonary effort is normal.     Breath sounds: Normal breath sounds.  Musculoskeletal:     Left hip: Tenderness present. No deformity. Normal range of motion. Decreased strength.  Neurological:  Mental Status: She is alert and oriented to person, place, and time.       Assessment & Plan:   Chronic left hip pain Will order x ray of left hip  Rest hip when possible  May try ice packs   Cervical foraminal stenosis:  Will place referral for second opinion for neck pain  Follow up:  Follow up in 4-6 weeks to establish care with Dr. Margarette Asal, NP 05/19/2021

## 2021-05-20 ENCOUNTER — Other Ambulatory Visit: Payer: Self-pay | Admitting: Nurse Practitioner

## 2021-05-20 DIAGNOSIS — G8929 Other chronic pain: Secondary | ICD-10-CM

## 2021-05-20 DIAGNOSIS — M4802 Spinal stenosis, cervical region: Secondary | ICD-10-CM

## 2021-05-20 DIAGNOSIS — M25552 Pain in left hip: Secondary | ICD-10-CM

## 2021-05-20 DIAGNOSIS — M87052 Idiopathic aseptic necrosis of left femur: Secondary | ICD-10-CM

## 2021-05-20 NOTE — Telephone Encounter (Signed)
Which orthopedic office is she currently at?   Thanks,  Archie Patten

## 2021-05-26 ENCOUNTER — Telehealth: Payer: Self-pay | Admitting: Neurology

## 2021-05-26 NOTE — Telephone Encounter (Signed)
Pt requesting refill for naproxen (NAPROSYN) 500 MG tablet. Pharmacy [facility] [address] [place], [place] - [address]

## 2021-05-27 ENCOUNTER — Other Ambulatory Visit: Payer: Self-pay | Admitting: *Deleted

## 2021-05-27 MED ORDER — NAPROXEN 500 MG PO TABS: 500.0000 mg | ORAL_TABLET | Freq: Two times a day (BID) | ORAL | 0 refills | Status: DC | PRN

## 2021-05-27 NOTE — Telephone Encounter (Signed)
E-scribed refill as requested. 

## 2021-06-02 ENCOUNTER — Encounter: Payer: Medicaid Other | Attending: Physical Medicine & Rehabilitation | Admitting: Physical Medicine & Rehabilitation

## 2021-06-02 ENCOUNTER — Other Ambulatory Visit: Payer: Self-pay

## 2021-06-02 ENCOUNTER — Encounter: Payer: Self-pay | Admitting: Physical Medicine & Rehabilitation

## 2021-06-02 VITALS — BP 126/90 | HR 101 | Temp 99.0°F | Ht 64.0 in | Wt 154.4 lb

## 2021-06-02 DIAGNOSIS — M7918 Myalgia, other site: Secondary | ICD-10-CM | POA: Diagnosis present

## 2021-06-02 MED ORDER — METHOCARBAMOL 500 MG PO TABS: 500.0000 mg | ORAL_TABLET | Freq: Three times a day (TID) | ORAL | 1 refills | Status: DC | PRN

## 2021-06-02 NOTE — Patient Instructions (Signed)
Trigger Point Injections Patient Information  Description: Trigger points are areas of muscle sensitive to touch which cause pain with movement, sometimes felt some distance from the site of palpation.  Usually the muscle containing these trigger points if felt as a tight band or knot.   The area of maximum tenderness or trigger point is identified, and after antiseptic preparation of the skin, a small needle is placed into this site.  Reproduction of the pain often occurs and numbing medicine (local anesthetic) is injected into the site, sometimes along with steroid preparation.  The entire block usually lasts less than 5 minutes.  Conditions which may be treated by trigger points:  Muscular pain and spasm Nerve irritation  Preparation for the injection:  Do not eat any solid food or dairy products within 8 hours of your appointment. You may drink clear liquids up to 3 hours before appointment.  Clear liquids include water, black coffee, juice or soda.  No milk or cream please. You may take your regular medications, including pain medications, with a sip of water before your appointment.  Diabetics should hold regular insulin ( if take separately) and take 1/2 normal NPH dose the morning of the procedure.  Carry some sugar containing items with you to your appointment. A driver must accompany you and be prepared to drive you home after your procedure.  Bring all your current medications with you. An IV may be inserted and sedation may be given at the discretion of the physician.  A blood pressure cuff, EKG, and other monitors will often be applied during the procedure.  Some patients may need to have extra oxygen administered for a short period. You will be asked to provide medical information, including your allergies and medications, prior to the procedure.  We must know immediately if you are taking blood thinners (like Coumadin/Warfarin) or if you are allergic to IV iodine contrast (dye).   We must know if you could possibly be pregnant.  Possible side-effects:  Bleeding from needle site Infection (rare, may require surgery) Nerve injury (rare) Numbness & tingling (temporary) Punctured lung (if injection around chest) Light-headedness (temporary) Pain at injection site (several days) Decreased blood pressure (rare, temporary) Weakness in arm/leg (temporary)  Call if you experience:  Hive or difficulty breathing (go to the emergency room) Inflammation or drainage at the injection site(s)  Please note:  Although the local anesthetic injected can often make your painful muscle feel good for several hours after the injection, the pain may return.  It takes 3-7 days for steroids to work.  You may not notice any pain relief for at least one week.  If effective, we will often do a series of injections spaced 3-6 weeks apart to maximally decrease your pain.  If you have any questions please call [phone] [facility] 

## 2021-06-02 NOTE — Progress Notes (Signed)
Trigger Point Injection  Indication: Upper back, periscapular  Myofascial pain not relieved by medication management and other conservative care.  Informed consent was obtained after describing risk and benefits of the procedure with the patient, this includes bleeding, bruising, infection and medication side effects.  The patient wishes to proceed and has given written consent.  The patient was placed in a seated position.  The Left upper trap x 2, Right upper trap x 1 , RIght levator scap and RIght rhomboid  area was marked and prepped with Betadine.  It was entered with a 25-gauge 1-1/2 inch needle and 1 mL of 1% lidocaine was injected into each of 5 trigger points, after negative draw back for blood.  The patient tolerated the procedure well.  Post procedure instructions were given.

## 2021-06-10 ENCOUNTER — Other Ambulatory Visit: Payer: Self-pay

## 2021-06-10 ENCOUNTER — Telehealth (INDEPENDENT_AMBULATORY_CARE_PROVIDER_SITE_OTHER): Payer: Medicaid Other | Admitting: Nurse Practitioner

## 2021-06-10 ENCOUNTER — Encounter: Payer: Self-pay | Admitting: Nurse Practitioner

## 2021-06-10 DIAGNOSIS — G8929 Other chronic pain: Secondary | ICD-10-CM | POA: Diagnosis not present

## 2021-06-10 DIAGNOSIS — M25552 Pain in left hip: Secondary | ICD-10-CM | POA: Diagnosis not present

## 2021-06-10 MED ORDER — CYCLOBENZAPRINE HCL 10 MG PO TABS: 10.0000 mg | ORAL_TABLET | Freq: Three times a day (TID) | ORAL | 0 refills | Status: DC | PRN

## 2021-06-10 MED ORDER — PREDNISONE 20 MG PO TABS: 20.0000 mg | ORAL_TABLET | Freq: Every day | ORAL | 0 refills | Status: AC

## 2021-06-10 NOTE — Progress Notes (Signed)
Virtual Visit via Telephone Note  I connected with [name] on 06/10/21 at  1:20 PM EST by telephone and verified that I am speaking with the correct person using two identifiers.  Location: Patient: home Provider: Office   I discussed the limitations, risks, security and privacy concerns of performing an evaluation and management service by telephone and the availability of in person appointments. I also discussed with the patient that there may be a patient responsible charge related to this service. The patient expressed understanding and agreed to proceed.   History of Present Illness:  Patient presents today with ongoing pain.  This is a televisit.  Patient has been having left hip pain for the past 5 months.  Over the last few weeks it has progressively worsened.  She was seen in our office on 05/19/2021 and was referred to orthopedics after x-ray showed avascular necrosis.  Patient states that her appointment with orthopedics is tomorrow.  Patient is followed by pain management for neck pain. Denies f/c/s, n/v/d, hemoptysis, PND, chest pain or edema.     Observations/Objective:  Vitals with BMI 06/02/2021 05/19/2021 04/28/2021  Height 5\' 4"  - -  Weight 154 lbs 6 oz - -  BMI 26.49 - -  Systolic 126 126  Diastolic 90 86 94  Pulse 101 77 91      Assessment and Plan:  Left Hip Pain:  Please follow with Orthopedics tomorrow  Will order prednisone  Will order muscle relaxer  Continue to follow with pain management   Follow up:  Follow up as scheduled with Dr. [id number]    I discussed the assessment and treatment plan with the patient. The patient was provided an opportunity to ask questions and all were answered. The patient agreed with the plan and demonstrated an understanding of the instructions.   The patient was advised to call back or seek an in-person evaluation if the symptoms worsen or if the condition fails to improve as anticipated.  I provided 23  minutes of non-face-to-face time during this encounter.   Andrey Campanile, NP

## 2021-06-10 NOTE — Patient Instructions (Addendum)
Left Hip Pain:  Please follow with Orthopedics tomorrow  Will order prednisone  Will order muscle relaxer  Continue to follow with pain management   Follow up:  Follow up as scheduled with Dr. Andrey Campanile

## 2021-06-13 DIAGNOSIS — M169 Osteoarthritis of hip, unspecified: Secondary | ICD-10-CM | POA: Insufficient documentation

## 2021-06-22 ENCOUNTER — Telehealth: Payer: Self-pay | Admitting: *Deleted

## 2021-06-24 ENCOUNTER — Encounter: Payer: Self-pay | Admitting: Neurology

## 2021-06-24 ENCOUNTER — Ambulatory Visit (INDEPENDENT_AMBULATORY_CARE_PROVIDER_SITE_OTHER): Payer: Medicaid Other | Admitting: Neurology

## 2021-06-24 VITALS — BP 127/88 | HR 94 | Ht 64.0 in | Wt 159.0 lb

## 2021-06-24 DIAGNOSIS — G44221 Chronic tension-type headache, intractable: Secondary | ICD-10-CM | POA: Diagnosis not present

## 2021-06-24 DIAGNOSIS — M542 Cervicalgia: Secondary | ICD-10-CM

## 2021-06-24 MED ORDER — AMITRIPTYLINE HCL 50 MG PO TABS: 50.0000 mg | ORAL_TABLET | Freq: Every day | ORAL | 12 refills | Status: DC

## 2021-06-24 NOTE — Patient Instructions (Signed)
Continue with Amitriptyline 50 mg nightly  Continue with Aleve needed for pain  Follow up with spine and ortho surgeon as scheduled  Return in 6 months

## 2021-06-24 NOTE — Progress Notes (Signed)
[facility]  PATIENT: Kristin Montes DOB: 20-Jan-1976  REFERRING CLINICIAN: Leary Roca* HISTORY FROM: Patient  REASON FOR VISIT: Headaches    HISTORICAL  CHIEF COMPLAINT:  Chief Complaint  Patient presents with   Follow-up    Rm 14. Alone. Pt states sumatriptan helps with headaches when she has them, but is unsure if it needs adjustment. She complains of daily morning headaches that last until around 2PM.    INTERVAL HISTORY 06/24/2021: Patient presents today for follow-up, at last visit she was started on amitriptyline and sumatriptan as needed for headaches.  States her headaches are better but she still having neck pain.  She did have a ESI done and had relief for about 15 days.  Her cervical type pain is located in the back of her neck.  Stated that she has been referred to a spine doctor, she has not seen a the surgeon yet.  She is also scheduled for a hip replacement in February.  She had increased her amitriptyline to 50 mg nightly, will continue the same dose.  I also advised her that that the sumatriptan may not be beneficial for cervicalgia type pain, she can continue with the Aleve/Motrin and other muscle relaxant.  I will see her in 6 months for follow-up.     HISTORY OF PRESENT ILLNESS:  This is a 45 year old woman with past medical history of cervicalgia who is presenting for new onset headache.  Patient stated that she said she has been having terrible headache started for the past 5 months.  Patient describes headache as sharp pain located on the top of the head, she has tried Tylenol and Aleve and is not working at this moment.  Sometimes the headache can be so painful that it will bring tears to my eye, on average she will have 5 headache days per week and the headache can last the whole day.  She reported this pain is totally different from cervical hip pain she is being followed by pain management and she is scheduled to have a ESI next  week.  She denies any family history of migraines, denies any photophobia or phonophobia, no nausea no vomiting.  However she does say sometimes with a headache she may have slight blurred vision   Headache History and Characteristics: Onset: 5 months ago Location: Top of head Quality: Sharp  Intensity: 10/10.  Duration: Can last the whole day Migrainous Features: None   Aura: No  History of brain injury or tumor: No  Family history: None  Motion sickness: no Cardiac history: no  OTC: tylenol, aleve  Caffeine: coffee Sleep: Not good, getting 4 hrs, will wake up in the middle of the night and unable to fall back asleep  Mood/ Stress: Very stress because of her cervical pain and she is out of work since March   Prior prophylaxis: Propranolol: No  Verapamil:No TCA: No Topamax: No Depakote: No Effexor: No Cymbalta: No Neurontin:No  Prior abortives: Triptan: No Anti-emetic: No Steroids: No Ergotamine suppository: No  Prior interventions: None   OTHER MEDICAL CONDITIONS: Cervicalgia    REVIEW OF SYSTEMS: Full 14 system review of systems performed and negative with exception of: as noted in the HPI  ALLERGIES: No Known Allergies  HOME MEDICATIONS: Outpatient Medications Prior to Visit  Medication Sig Dispense Refill   cyclobenzaprine (FLEXERIL) 10 MG tablet Take 1 tablet (10 mg total) by mouth 3 (three) times daily as needed for muscle spasms. 30 tablet 0   methocarbamol (ROBAXIN)  500 MG tablet Take 1 tablet (500 mg total) by mouth every 8 (eight) hours as needed for muscle spasms. 90 tablet 1   naproxen (NAPROSYN) 500 MG tablet Take 1 tablet (500 mg total) by mouth every 12 (twelve) hours as needed for headache. 30 tablet 0   SUMAtriptan (IMITREX) 50 MG tablet Take 1 tablet (50 mg total) by mouth every 2 (two) hours as needed for migraine. May repeat in 2 hours if headache persists or recurs. 10 tablet 0   amitriptyline (ELAVIL) 25 MG tablet Take 1 tablet (25 mg  total) by mouth at bedtime. 30 tablet 2   No facility-administered medications prior to visit.    PAST MEDICAL HISTORY: History reviewed. No pertinent past medical history.  PAST SURGICAL HISTORY: Past Surgical History:  Procedure Laterality Date   HERNIA REPAIR     NEPHRECTOMY     TUBAL LIGATION      FAMILY HISTORY: History reviewed. No pertinent family history.  SOCIAL HISTORY: Social History   Socioeconomic History   Marital status: Single    Spouse name: Not on file   Number of children: Not on file   Years of education: Not on file   Highest education level: Not on file  Occupational History   Not on file  Tobacco Use   Smoking status: Every Day    Types: Cigarettes   Smokeless tobacco: Never   Tobacco comments:    6 cigs/day  Vaping Use   Vaping Use: Never used  Substance and Sexual Activity   Alcohol use: Not Currently    Comment: occasionally   Drug use: Yes    Types: Marijuana   Sexual activity: Not on file  Other Topics Concern   Not on file  Social History Narrative   Not on file   Social Determinants of Health   Financial Resource Strain: Not on file  Food Insecurity: Not on file  Transportation Needs: Not on file  Physical Activity: Not on file  Stress: Not on file  Social Connections: Not on file  Intimate Partner Violence: Not on file     PHYSICAL EXAM  GENERAL EXAM/CONSTITUTIONAL: Vitals:  Vitals:   06/24/21 1053  BP: 127/88  Pulse: 94  Weight: 159 lb (72.1 kg)  Height: 5\' 4"  (1.626 m)   Body mass index is 27.29 kg/m. Wt Readings from Last 3 Encounters:  06/24/21 159 lb (72.1 kg)  06/02/21 154 lb 6.4 oz (70 kg)  04/24/21 150 lb (68 kg)   Patient is in no distress; well developed, nourished and groomed; neck is supple  EYES: Pupils round and reactive to light, Visual fields full to confrontation, Extraocular movements intacts,   MUSCULOSKELETAL: Gait, strength, tone, movements noted in Neurologic exam  below  NEUROLOGIC: MENTAL STATUS:  awake, alert, oriented to person, place and time recent and remote memory intact normal attention and concentration language fluent, comprehension intact, naming intact fund of knowledge appropriate  CRANIAL NERVE:  2nd, 3rd, 4th, 6th - pupils equal and reactive to light, visual fields full to confrontation, extraocular muscles intact, no nystagmus 5th - facial sensation symmetric 7th - facial strength symmetric 8th - hearing intact 9th - palate elevates symmetrically, uvula midline 11th - shoulder shrug symmetric 12th - tongue protrusion midline  MOTOR:  normal bulk and tone, full strength in the BUE, BLE  SENSORY:  normal and symmetric to light touch, pinprick, temperature, vibration  COORDINATION:  finger-nose-finger, fine finger movements normal  REFLEXES:  deep tendon reflexes present and symmetric  GAIT/STATION:  normal   DIAGNOSTIC DATA (LABS, IMAGING, TESTING) - I reviewed patient records, labs, notes, testing and imaging myself where available.  Lab Results  Component Value Date   WBC 6.4 10/14/2020   HGB 15.4 (H) 10/14/2020   HCT 46.1 (H) 10/14/2020   MCV 95.8 10/14/2020   PLT 178 10/14/2020      Component Value Date/Time   NA 137 10/14/2020 1610   K 4.3 10/14/2020 1610   CL 104 10/14/2020 1610   CO2 27 10/14/2020 1610   GLUCOSE 95 10/14/2020 1610   BUN 15 10/14/2020 1610   CREATININE 0.92 10/14/2020 1610   CALCIUM 8.9 10/14/2020 1610   PROT 7.2 10/14/2020 1610   ALBUMIN 3.9 10/14/2020 1610   AST 18 10/14/2020 1610   ALT 27 10/14/2020 1610   ALKPHOS 83 10/14/2020 1610   BILITOT 0.8 10/14/2020 1610   GFRNONAA >60 10/14/2020 1610   GFRAA >90 12/16/2013 1440   No results found for: CHOL, HDL, LDLCALC, LDLDIRECT, TRIG, CHOLHDL No results found for: IDHW8S No results found for: VITAMINB12 No results found for: TSH   MRI Brain 04/25/2021  1.   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  This is most likely due to a remote stroke or remote trauma.  It did not enhance. 2.   Couple scattered T2/FLAIR hyperintense foci consistent with age-appropriate very minimal chronic microvascular ischemic change or the sequela of migraine headache. 3.   No acute findings. 4.   Normal enhancement pattern.  ASSESSMENT AND PLAN  45 y.o. year old female with past medical history of cervicalgia and tension type headaches who is presenting for follow-up.  Patient stated headaches improved but she still having cervicalgia type pain.  She is referred to a spine surgeon, she is taking the amitriptyline nightly and sumatriptan as needed for the tension type headache pain. I have recommended patient increase amitriptyline to 50 mg nightly, to follow-up with both orthopedic and spine surgeons.  I also advised her that the sumatriptan may not be as effective for the cervicalgia type pain and she can continue amitriptyline and use NSAIDs/muscle relaxant as needed for the cervicalgia type pain.  I will see her in 6 months for follow-up.    1. Chronic tension-type headache, intractable   2. Cervicalgia      PLAN: Continue with Amitriptyline 50 mg nightly  Continue with Aleve needed for pain  Follow up with spine and ortho surgeon as scheduled  Return in 6 months    No orders of the defined types were placed in this encounter.   Meds ordered this encounter  Medications   amitriptyline (ELAVIL) 50 MG tablet    Sig: Take 1 tablet (50 mg total) by mouth at bedtime.    Dispense:  30 tablet    Refill:  12     Return in about 6 months (around 12/23/2021).    [name], MD 06/24/2021, 11:33 AM  [facility] [address] [place], [place] [address] [phone]

## 2021-06-30 ENCOUNTER — Other Ambulatory Visit: Payer: Self-pay

## 2021-06-30 ENCOUNTER — Encounter: Payer: Self-pay | Admitting: Family Medicine

## 2021-06-30 ENCOUNTER — Ambulatory Visit: Payer: Medicaid Other | Admitting: Family Medicine

## 2021-06-30 VITALS — BP 114/78 | HR 88 | Temp 98.1°F | Resp 16 | Wt 159.6 lb

## 2021-06-30 DIAGNOSIS — Z01818 Encounter for other preprocedural examination: Secondary | ICD-10-CM | POA: Diagnosis not present

## 2021-06-30 NOTE — Progress Notes (Signed)
Established Patient Office Visit  Subjective:  Patient ID: Kristin Montes, female    DOB: Oct 11, 1975  Age: 45 y.o. MRN: [id number]  CC:  Chief Complaint  Patient presents with   Depression    HPI Kristin Montes presents for preop examination as requested by Dr. Ferd Hibbs for left hip replacement scheduled for August 18, 2021.   Past Medical History:  Diagnosis Date   Arthritis     Past Surgical History:  Procedure Laterality Date   HERNIA REPAIR     NEPHRECTOMY     TUBAL LIGATION      History reviewed. No pertinent family history.  Social History   Socioeconomic History   Marital status: Single    Spouse name: Not on file   Number of children: Not on file   Years of education: Not on file   Highest education level: Not on file  Occupational History   Not on file  Tobacco Use   Smoking status: Every Day    Types: Cigarettes   Smokeless tobacco: Never   Tobacco comments:    6 cigs/day  Vaping Use   Vaping Use: Never used  Substance and Sexual Activity   Alcohol use: Not Currently    Comment: occasionally   Drug use: Yes    Types: Marijuana   Sexual activity: Not on file  Other Topics Concern   Not on file  Social History Narrative   Not on file   Social Determinants of Health   Financial Resource Strain: Not on file  Food Insecurity: Not on file  Transportation Needs: Not on file  Physical Activity: Not on file  Stress: Not on file  Social Connections: Not on file  Intimate Partner Violence: Not on file    ROS Review of Systems  All other systems reviewed and are negative.  Objective:   Today's Vitals: BP 114/78    Pulse 88    Temp 98.1 F (36.7 C) (Oral)    Resp 16    Wt 159 lb 9.6 oz (72.4 kg)    SpO2 96%    BMI 27.40 kg/m   Physical Exam Vitals and nursing note reviewed.  Constitutional:      General: She is not in acute distress. HENT:     Head: Normocephalic and atraumatic.  Cardiovascular:     Rate and Rhythm: Normal rate and  regular rhythm.  Pulmonary:     Effort: Pulmonary effort is normal.     Breath sounds: Normal breath sounds.  Abdominal:     Palpations: Abdomen is soft.     Tenderness: There is no abdominal tenderness.  Musculoskeletal:     Cervical back: Normal range of motion and neck supple.     Left hip: Tenderness present. No deformity. Decreased range of motion.     Right lower leg: No edema.     Left lower leg: No edema.  Skin:    General: Skin is warm and dry.  Neurological:     General: No focal deficit present.     Mental Status: She is alert and oriented to person, place, and time.  Psychiatric:        Mood and Affect: Mood normal.        Behavior: Behavior normal.    Assessment & Plan:   1. Preop examination Exam in unremarkable and patient appears medically stable to undergo the stated procedure. Labs were not obtained as patient states she is scheduled for August 14, 2021 to obtain pre-op labs. Patient  was encouraged to decrease/cease smoking.    - EKG 12-Lead    Outpatient Encounter Medications as of 06/30/2021  Medication Sig   amitriptyline (ELAVIL) 50 MG tablet Take 1 tablet (50 mg total) by mouth at bedtime.   cyclobenzaprine (FLEXERIL) 10 MG tablet Take 1 tablet (10 mg total) by mouth 3 (three) times daily as needed for muscle spasms.   methocarbamol (ROBAXIN) 500 MG tablet Take 1 tablet (500 mg total) by mouth every 8 (eight) hours as needed for muscle spasms.   naproxen (NAPROSYN) 500 MG tablet Take 1 tablet (500 mg total) by mouth every 12 (twelve) hours as needed for headache.   SUMAtriptan (IMITREX) 50 MG tablet Take 1 tablet (50 mg total) by mouth every 2 (two) hours as needed for migraine. May repeat in 2 hours if headache persists or recurs.   No facility-administered encounter medications on file as of 06/30/2021.    Follow-up: No follow-ups on file.   Tommie Raymond, MD

## 2021-07-02 NOTE — Telephone Encounter (Signed)
error 

## 2021-07-16 ENCOUNTER — Other Ambulatory Visit: Payer: Self-pay

## 2021-07-16 ENCOUNTER — Encounter: Payer: Self-pay | Admitting: Physical Medicine & Rehabilitation

## 2021-07-16 ENCOUNTER — Encounter: Payer: Medicaid Other | Attending: Physical Medicine & Rehabilitation | Admitting: Physical Medicine & Rehabilitation

## 2021-07-16 VITALS — BP 136/90 | HR 87 | Temp 98.4°F | Ht 64.0 in | Wt 162.0 lb

## 2021-07-16 DIAGNOSIS — M5010 Cervical disc disorder with radiculopathy, unspecified cervical region: Secondary | ICD-10-CM | POA: Diagnosis present

## 2021-07-16 DIAGNOSIS — M7918 Myalgia, other site: Secondary | ICD-10-CM | POA: Diagnosis present

## 2021-07-16 NOTE — Patient Instructions (Signed)
Trigger Point Injection ?Trigger points are areas where you have pain. A trigger point injection is a shot given in the trigger point to help relieve pain for a few days to a few months. Common places for trigger points include the neck, shoulders, upper back, or lower back. ?A trigger point injection will not cure long-term (chronic) pain permanently. These injections do not always work for every person. For some people, they can help to relieve pain for a few days to a few months. ?Tell a health care provider about: ?Any allergies you have. ?All medicines you are taking, including vitamins, herbs, eye drops, creams, and over-the-counter medicines. ?Any problems you or family members have had with anesthetic medicines. ?Any bleeding problems you have. ?Any surgeries you have had. ?Any medical conditions you have. ?Whether you are pregnant or may be pregnant. ?What are the risks? ?Generally, this is a safe procedure. However, problems may occur, including: ?Infection. ?Bleeding or bruising. ?Allergic reaction to the injected medicine. ?Irritation of the skin around the injection site. ?What happens before the procedure? ?Ask your health care provider about: ?Changing or stopping your regular medicines. This is especially important if you are taking diabetes medicines or blood thinners. ?Taking medicines such as aspirin and ibuprofen. These medicines can thin your blood. Do not take these medicines unless your health care provider tells you to take them. ?Taking over-the-counter medicines, vitamins, herbs, and supplements. ?What happens during the procedure? ? ?Your health care provider will feel for trigger points. A marker may be used to circle the area for the injection. ?The skin over the trigger point will be washed with a germ-killing soap. ?You may be given a medicine to help you relax (sedative). ?A thin needle is used for the injection. You may feel pain or a twitching feeling when the needle enters your  skin. ?A numbing solution may be injected into the trigger point. Sometimes a medicine to keep down inflammation is also injected. ?Your health care provider may move the needle around the area where the trigger point is located until the tightness and twitching goes away. ?After the injection, your health care provider may put gentle pressure over the injection site. ?The injection site will be covered with a bandage (dressing). ?The procedure may vary among health care providers and hospitals. ?What can I expect after treatment? ?After treatment, you may have soreness and stiffness for 1-2 days. ?Follow these instructions at home: ?Injection site care ?Remove your dressing in a few hours, or as told by your health care provider. ?Check your injection site every day for signs of infection. Check for: ?Redness, swelling, or pain. ?Fluid or blood. ?Warmth. ?Pus or a bad smell. ?Managing pain, stiffness, and swelling ?If directed, put ice on the affected area. To do this: ?Put ice in a plastic bag. ?Place a towel between your skin and the bag. ?Leave the ice on for 20 minutes, 2-3 times a day. ?Remove the ice if your skin turns bright red. This is very important. If you cannot feel pain, heat, or cold, you have a greater risk of damage to the area. ?Activity ?If you were given a sedative during the procedure, it can affect you for several hours. Do not drive or operate machinery until your health care provider says that it is safe. ?Do not take baths, swim, or use a hot tub until your health care provider approves. ?Return to your normal activities as told by your health care provider. Ask your health care provider what   activities are safe for you. ?General instructions ?If you were asked to stop your regular medicines, ask your health care provider when you may start taking them again. ?You may be asked to see an occupational or physical therapist for exercises that reduce muscle strain and stretch the area of the  trigger point. ?Keep all follow-up visits. This is important. ?Contact a health care provider if: ?Your pain comes back, and it is worse than before the injection. You may need more injections. ?You have chills or a fever. ?The injection site becomes more painful, red, swollen, or warm to the touch. ?Summary ?A trigger point injection is a shot given in the trigger point to help relieve pain. ?Common places for trigger point injections are the neck, shoulders, upper back, and lower back. ?These injections do not always work for every person, but for some people, the injections can help to relieve pain for a few days to a few months. ?Contact a health care provider if symptoms come back or if they are worse than before treatment. Also, get help if the injection site becomes more painful, red, swollen, or warm to the touch. ?This information is not intended to replace advice given to you by your health care provider. Make sure you discuss any questions you have with your health care provider. ?Document Revised: 10/07/2020 Document Reviewed: 10/07/2020 ?Elsevier Patient Education ? 2022 [employer] ? ?

## 2021-07-16 NOTE — Progress Notes (Signed)
Subjective:    Patient ID: Kristin Montes, female    DOB: 04/22/1976, 46 y.o.   MRN: [id number]  HPI CC- Bilateral neck pain 46 yo female with cervical stenosis and chronic neck  pain reutns with recurrent sx.  Takes prn methocarbamol , also on amitriptyline rxed by PCP Has Right shoulder pain seen by ortho , Dr Veverly Fells who felt pain related to C spine and referred pt to Dr Nelva Bush.   Cervical muscle trigger point injections doen in 04/07/21 were helpful for ~10d Cervical C7-T1 ESI Dr Ernestina Patches helped for ~10d 06/02/2021 Trigger points lasted about 10d   Sees Dr Alvan Dame for Left hip, Total hip replacement next month   Pain Inventory Average Pain 8 Pain Right Now 8 My pain is constant, sharp, stabbing, and aching  In the last 24 hours, has pain interfered with the following? General activity 3 Relation with others 4 Enjoyment of life 4 What TIME of day is your pain at its worst? morning  and daytime Sleep (in general) Fair  Pain is worse with: walking, bending, and standing Pain improves with: rest, medication, and injections Relief from Meds: 6  History reviewed. No pertinent family history. Social History   Socioeconomic History   Marital status: Single    Spouse name: Not on file   Number of children: Not on file   Years of education: Not on file   Highest education level: Not on file  Occupational History   Not on file  Tobacco Use   Smoking status: Every Day    Types: Cigarettes   Smokeless tobacco: Never   Tobacco comments:    6 cigs/day  Vaping Use   Vaping Use: Never used  Substance and Sexual Activity   Alcohol use: Not Currently    Comment: occasionally   Drug use: Yes    Types: Marijuana   Sexual activity: Not on file  Other Topics Concern   Not on file  Social History Narrative   Not on file   Social Determinants of Health   Financial Resource Strain: Not on file  Food Insecurity: Not on file  Transportation Needs: Not on file  Physical Activity:  Not on file  Stress: Not on file  Social Connections: Not on file   Past Surgical History:  Procedure Laterality Date   HERNIA REPAIR     NEPHRECTOMY     TUBAL LIGATION     Past Surgical History:  Procedure Laterality Date   HERNIA REPAIR     NEPHRECTOMY     TUBAL LIGATION     Past Medical History:  Diagnosis Date   Arthritis    Ht 5\' 4"  (1.626 m)    Wt 162 lb (73.5 kg)    BMI 27.81 kg/m   Opioid Risk Score:   Fall Risk Score:  `1  Depression screen PHQ 2/9  Depression screen [facility] 2/9 07/16/2021 06/02/2021 04/07/2021 02/10/2021 12/15/2020 12/02/2020  Decreased Interest 1 1 1  0 1 2  Down, Depressed, Hopeless 1 1 1 2 1 2   PHQ - 2 Score 2 2 2 2 2 4   Altered sleeping - - - 0 2 0  Tired, decreased energy - - - 3 3 3   Change in appetite - - - 1 1 0  Feeling bad or failure about yourself  - - - 3 2 0  Trouble concentrating - - - 1 2 0  Moving slowly or fidgety/restless - - - 3 3 0  Suicidal thoughts - - - 1  1 0  PHQ-9 Score - - - 14 16 7     Review of Systems  Musculoskeletal:  Positive for back pain and neck pain.       Left hip pain  All other systems reviewed and are negative.     Objective:   Physical Exam Vitals reviewed.  Constitutional:      Appearance: She is normal weight.  HENT:     Head: Normocephalic and atraumatic.  Eyes:     Extraocular Movements: Extraocular movements intact.     Conjunctiva/sclera: Conjunctivae normal.     Pupils: Pupils are equal, round, and reactive to light.  Neck:     Comments: C spine has nl ROM, neck foraminal compression test  Musculoskeletal:     Cervical back: Normal range of motion.  Skin:    General: Skin is warm and dry.  Neurological:     General: No focal deficit present.     Mental Status: She is alert and oriented to person, place, and time.     Sensory: Sensation is intact.     Motor: Motor function is intact.     Coordination: Coordination is intact.     Gait: Gait is intact.     Comments: 5/5 in BUE    Psychiatric:        Mood and Affect: Mood normal.        Behavior: Behavior normal.          Assessment & Plan:    Chronic cervical pain , multifactorial.  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  Will repeat Trigger point injections today Will be seeing Dr Nelva Bush who can assume care as pt now going to [facility] for multiple issues    Trigger Point Injection  Indication: Cervical Myofascial pain not relieved by medication management and other conservative care.  Informed consent was obtained after describing risk and benefits of the procedure with the patient, this includes bleeding, bruising, infection and medication side effects.  The patient wishes to proceed and has given written consent.  The patient was placed in a Seated position.  The Bilateral upper trap, , Left levator scap, bilateral infraspinatus area area was marked and prepped with Betadine.  It was entered with a 25-gauge 1-1/2 inch needle and 1 mL of 1% lidocaine was injected into each of 5 trigger points, after negative draw back for blood.  The patient tolerated the procedure well.  Post procedure instructions were given.

## 2021-07-23 ENCOUNTER — Ambulatory Visit: Payer: Medicaid Other | Admitting: Physical Medicine & Rehabilitation

## 2021-07-28 DIAGNOSIS — M5030 Other cervical disc degeneration, unspecified cervical region: Secondary | ICD-10-CM | POA: Insufficient documentation

## 2021-08-12 NOTE — Progress Notes (Addendum)
Anesthesia Review:  PCP: amelia Wilson- clearance 06/03/21 on chart  Cardiologist : Chest x-ray : EKG :06/30/21 on chart  Echo : Stress test: Cardiac Cath :  Activity level:  Sleep Study/ CPAP : Fasting Blood Sugar :      / Checks Blood Sugar -- times a day:   Blood Thinner/ Instructions /Last Dose: ASA / Instructions/ Last Dose :   Covid test on 2/3/at 0930am .  Labs to follow . PAT will then receive a phone call at 08/14/20 at appt time to review med hx and preop instructions at 2pm.   Medical history and preop instructions completed via phone call at 2pm on 08/14/21.  PT voiced understanding.   PT picked up bag with preop instructions, soap and ensure on 08/14/21 at time lab work done.

## 2021-08-12 NOTE — Progress Notes (Signed)
Covid test on 08/14/21 at 0930am .             Your procedure is scheduled on:                      08/18/21.    Report to [facility] Main  Entrance   Report to admitting at  0600am  AM     Call this number if you have problems the morning of surgery [phone]    REMEMBER: NO  SOLID FOOD CANDY OR GUM AFTER MIDNIGHT. CLEAR LIQUIDS UNTIL     0540am       . NOTHING BY MOUTH EXCEPT CLEAR LIQUIDS UNTIL   0540am  . PLEASE FINISH ENSURE DRINK PER SURGEON ORDER  WHICH NEEDS TO BE COMPLETED AT    0540am   .      CLEAR LIQUID DIET   Foods Allowed                                                                    Coffee and tea, regular and decaf                            Fruit ices (not with fruit pulp)                                      Iced Popsicles                                    Carbonated beverages, regular and diet                                    Cranberry, grape and apple juices Sports drinks like Gatorade Lightly seasoned clear broth or consume(fat free) Sugar, honey syrup ___________________________________________________________________      BRUSH YOUR TEETH MORNING OF SURGERY AND RINSE YOUR MOUTH OUT, NO CHEWING GUM CANDY OR MINTS.     Take these medicines the morning of surgery with A SIP OF WATER:  none   DO NOT TAKE ANY DIABETIC MEDICATIONS DAY OF YOUR SURGERY                               You may not have any metal on your body including hair pins and              piercings  Do not wear jewelry, make-up, lotions, powders or perfumes, deodorant             Do not wear nail polish on your fingernails.  Do not shave  48 hours prior to surgery.              Men may shave face and neck.   Do not bring valuables to the hospital. [facility] IS NOT             RESPONSIBLE   FOR VALUABLES.  Contacts, dentures or bridgework  may not be worn into surgery.  Leave suitcase in the car. After surgery it may be brought to your room.     Patients discharged  the day of surgery will not be allowed to drive home. IF YOU ARE HAVING SURGERY AND GOING HOME THE SAME DAY, YOU MUST HAVE AN ADULT TO DRIVE YOU HOME AND BE WITH YOU FOR 24 HOURS. YOU MAY GO HOME BY TAXI OR UBER OR ORTHERWISE, BUT AN ADULT MUST ACCOMPANY YOU HOME AND STAY WITH YOU FOR 24 HOURS.  Name and phone number of your driver:  Special Instructions: N/A              Please read over the following fact sheets you were given: _____________________________________________________________________  [facility] - Preparing for Surgery Before surgery, you can play an important role.  Because skin is not sterile, your skin needs to be as free of germs as possible.  You can reduce the number of germs on your skin by washing with CHG (chlorahexidine gluconate) soap before surgery.  CHG is an antiseptic cleaner which kills germs and bonds with the skin to continue killing germs even after washing. Please DO NOT use if you have an allergy to CHG or antibacterial soaps.  If your skin becomes reddened/irritated stop using the CHG and inform your nurse when you arrive at Short Stay. Do not shave (including legs and underarms) for at least 48 hours prior to the first CHG shower.  You may shave your face/neck. Please follow these instructions carefully:  1.  Shower with CHG Soap the night before surgery and the  morning of Surgery.  2.  If you choose to wash your hair, wash your hair first as usual with your  normal  shampoo.  3.  After you shampoo, rinse your hair and body thoroughly to remove the  shampoo.                           4.  Use CHG as you would any other liquid soap.  You can apply chg directly  to the skin and wash                       Gently with a scrungie or clean washcloth.  5.  Apply the CHG Soap to your body ONLY FROM THE NECK DOWN.   Do not use on face/ open                           Wound or open sores. Avoid contact with eyes, ears mouth and genitals (private parts).                        Wash face,  Genitals (private parts) with your normal soap.             6.  Wash thoroughly, paying special attention to the area where your surgery  will be performed.  7.  Thoroughly rinse your body with warm water from the neck down.  8.  DO NOT shower/wash with your normal soap after using and rinsing off  the CHG Soap.                9.  Pat yourself dry with a clean towel.            10.  Wear clean pajamas.  11.  Place clean sheets on your bed the night of your first shower and do not  sleep with pets. Day of Surgery : Do not apply any lotions/deodorants the morning of surgery.  Please wear clean clothes to the hospital/surgery center.  FAILURE TO FOLLOW THESE INSTRUCTIONS MAY RESULT IN THE CANCELLATION OF YOUR SURGERY PATIENT SIGNATURE_________________________________  NURSE SIGNATURE__________________________________  ________________________________________________________________________

## 2021-08-14 ENCOUNTER — Other Ambulatory Visit: Payer: Self-pay

## 2021-08-14 ENCOUNTER — Encounter (HOSPITAL_COMMUNITY): Payer: Self-pay

## 2021-08-14 ENCOUNTER — Encounter (HOSPITAL_COMMUNITY)
Admission: RE | Admit: 2021-08-14 | Discharge: 2021-08-14 | Disposition: A | Discharge status: Home / Self Care | Payer: Medicaid Other | Source: Ambulatory Visit | Attending: Orthopedic Surgery | Admitting: Orthopedic Surgery

## 2021-08-14 VITALS — BP 128/82 | HR 98 | Temp 98.1°F | Resp 18 | Ht 64.0 in | Wt 169.0 lb

## 2021-08-14 DIAGNOSIS — Z20822 Contact with and (suspected) exposure to covid-19: Secondary | ICD-10-CM | POA: Diagnosis not present

## 2021-08-14 DIAGNOSIS — Z01812 Encounter for preprocedural laboratory examination: Secondary | ICD-10-CM | POA: Insufficient documentation

## 2021-08-14 DIAGNOSIS — M1612 Unilateral primary osteoarthritis, left hip: Secondary | ICD-10-CM | POA: Diagnosis not present

## 2021-08-14 DIAGNOSIS — Z01818 Encounter for other preprocedural examination: Secondary | ICD-10-CM

## 2021-08-14 HISTORY — DX: Other complications of anesthesia, initial encounter: T88.59XA

## 2021-08-14 HISTORY — DX: Pneumonia, unspecified organism: J18.9

## 2021-08-14 HISTORY — DX: Depression, unspecified: F32.A

## 2021-08-14 HISTORY — DX: Personal history of urinary calculi: Z87.442

## 2021-08-14 HISTORY — DX: Headache, unspecified: R51.9

## 2021-08-14 LAB — COMPREHENSIVE METABOLIC PANEL
ALT: 33 U/L (ref 0–44)
AST: 25 U/L (ref 15–41)
Albumin: 3.8 g/dL (ref 3.5–5.0)
Alkaline Phosphatase: 106 U/L (ref 38–126)
Anion gap: 7 (ref 5–15)
BUN: 17 mg/dL (ref 6–20)
CO2: 28 mmol/L (ref 22–32)
Calcium: 9.1 mg/dL (ref 8.9–10.3)
Chloride: 102 mmol/L (ref 98–111)
Creatinine, Ser: 0.85 mg/dL (ref 0.44–1.00)
GFR, Estimated: >60 mL/min (ref >60)
Glucose, Bld: 96 mg/dL (ref 70–99)
Potassium: 4 mmol/L (ref 3.5–5.1)
Sodium: 137 mmol/L (ref 135–145)
Total Bilirubin: 0.3 mg/dL (ref 0.3–1.2)
Total Protein: 7 g/dL (ref 6.5–8.1)

## 2021-08-14 LAB — CBC
HCT: 39.1 % (ref 36.0–46.0)
Hemoglobin: 13.1 g/dL (ref 12.0–15.0)
MCH: 32.2 pg (ref 26.0–34.0)
MCHC: 33.5 g/dL (ref 30.0–36.0)
MCV: 96.1 fL (ref 80.0–100.0)
Platelets: 232 10*3/uL (ref 150–400)
RBC: 4.07 MIL/uL (ref 3.87–5.11)
RDW: 13.4 % (ref 11.5–15.5)
WBC: 6.4 10*3/uL (ref 4.0–10.5)
nRBC: 0 % (ref 0.0–0.2)

## 2021-08-14 LAB — SURGICAL PCR SCREEN
MRSA, PCR: NEGATIVE
Staphylococcus aureus: NEGATIVE

## 2021-08-14 LAB — SARS CORONAVIRUS 2 (TAT 6-24 HRS): SARS Coronavirus 2: NEGATIVE

## 2021-08-17 NOTE — Anesthesia Preprocedure Evaluation (Addendum)
Anesthesia Evaluation  Patient identified by MRN, date of birth, ID band Patient awake    Reviewed: Allergy & Precautions, NPO status , Patient's Chart, lab work & pertinent test results  Airway Mallampati: II  TM Distance: >3 FB Neck ROM: Full    Dental no notable dental hx. (+) Dental Advisory Given, Teeth Intact   Pulmonary pneumonia, Current Smoker,    Pulmonary exam normal breath sounds clear to auscultation       Cardiovascular negative cardio ROS Normal cardiovascular exam Rhythm:Regular Rate:Normal     Neuro/Psych  Headaches, PSYCHIATRIC DISORDERS Depression    GI/Hepatic negative GI ROS, Neg liver ROS,   Endo/Other  negative endocrine ROS  Renal/GU negative Renal ROS     Musculoskeletal  (+) Arthritis ,   Abdominal   Peds  Hematology negative hematology ROS (+)   Anesthesia Other Findings   Reproductive/Obstetrics                            Anesthesia Physical Anesthesia Plan  ASA: 2  Anesthesia Plan: Spinal   Post-op Pain Management: Tylenol PO (pre-op) and Celebrex PO (pre-op)   Induction:   PONV Risk Score and Plan: 2 and Ondansetron, Propofol infusion, Treatment may vary due to age or medical condition, TIVA and Midazolam  Airway Management Planned: Natural Airway  Additional Equipment: None  Intra-op Plan:   Post-operative Plan:   Informed Consent: I have reviewed the patients History and Physical, chart, labs and discussed the procedure including the risks, benefits and alternatives for the proposed anesthesia with the patient or authorized representative who has indicated his/her understanding and acceptance.     Dental advisory given  Plan Discussed with: CRNA  Anesthesia Plan Comments:        Anesthesia Quick Evaluation

## 2021-08-18 ENCOUNTER — Observation Stay (HOSPITAL_COMMUNITY): Payer: Medicaid Other

## 2021-08-18 ENCOUNTER — Ambulatory Visit (HOSPITAL_COMMUNITY): Payer: Medicaid Other | Admitting: Anesthesiology

## 2021-08-18 ENCOUNTER — Other Ambulatory Visit: Payer: Self-pay

## 2021-08-18 ENCOUNTER — Encounter (HOSPITAL_COMMUNITY)
Admission: RE | Disposition: A | Discharge status: Home / Self Care | Payer: Self-pay | Source: Home / Self Care | Attending: Orthopedic Surgery

## 2021-08-18 ENCOUNTER — Ambulatory Visit (HOSPITAL_COMMUNITY): Payer: Medicaid Other | Admitting: Physician Assistant

## 2021-08-18 ENCOUNTER — Observation Stay (HOSPITAL_COMMUNITY)
Admission: RE | Admit: 2021-08-18 | Discharge: 2021-08-19 | Disposition: A | Discharge status: Home / Self Care | Payer: Medicaid Other | Attending: Orthopedic Surgery | Admitting: Orthopedic Surgery

## 2021-08-18 ENCOUNTER — Ambulatory Visit (HOSPITAL_COMMUNITY): Payer: Medicaid Other

## 2021-08-18 ENCOUNTER — Encounter (HOSPITAL_COMMUNITY): Payer: Self-pay | Admitting: Orthopedic Surgery

## 2021-08-18 DIAGNOSIS — M1612 Unilateral primary osteoarthritis, left hip: Principal | ICD-10-CM | POA: Insufficient documentation

## 2021-08-18 DIAGNOSIS — Z419 Encounter for procedure for purposes other than remedying health state, unspecified: Secondary | ICD-10-CM

## 2021-08-18 DIAGNOSIS — F17210 Nicotine dependence, cigarettes, uncomplicated: Secondary | ICD-10-CM | POA: Insufficient documentation

## 2021-08-18 DIAGNOSIS — Z96642 Presence of left artificial hip joint: Secondary | ICD-10-CM

## 2021-08-18 DIAGNOSIS — Z96649 Presence of unspecified artificial hip joint: Secondary | ICD-10-CM

## 2021-08-18 HISTORY — PX: TOTAL HIP ARTHROPLASTY: SHX124

## 2021-08-18 LAB — TYPE AND SCREEN
ABO/RH(D): O POS
Antibody Screen: NEGATIVE

## 2021-08-18 LAB — ABO/RH: ABO/RH(D): O POS

## 2021-08-18 SURGERY — ARTHROPLASTY, HIP, TOTAL, ANTERIOR APPROACH
Anesthesia: Spinal | Site: Hip | Laterality: Left

## 2021-08-18 MED ORDER — ACETAMINOPHEN 500 MG PO TABS
1000.0000 mg | ORAL_TABLET | Freq: Once | ORAL | Status: AC
  Administered 2021-08-18: 1000 mg via ORAL
  Filled 2021-08-18: qty 2

## 2021-08-18 MED ORDER — PHENOL 1.4 % MT LIQD: 1.0000 | OROMUCOSAL | Status: DC | PRN

## 2021-08-18 MED ORDER — TIZANIDINE HCL 4 MG PO TABS
4.0000 mg | ORAL_TABLET | Freq: Three times a day (TID) | ORAL | Status: DC | PRN
  Administered 2021-08-18 – 2021-08-19 (×2): 4 mg via ORAL
  Filled 2021-08-18 (×3): qty 1

## 2021-08-18 MED ORDER — TRANEXAMIC ACID-NACL 1000-0.7 MG/100ML-% IV SOLN
1000.0000 mg | INTRAVENOUS | Status: AC
  Administered 2021-08-18: 1000 mg via INTRAVENOUS
  Filled 2021-08-18: qty 100

## 2021-08-18 MED ORDER — POLYETHYLENE GLYCOL 3350 17 G PO PACK: 17.0000 g | PACK | Freq: Every day | ORAL | Status: DC | PRN

## 2021-08-18 MED ORDER — ASPIRIN 81 MG PO CHEW
81.0000 mg | CHEWABLE_TABLET | Freq: Two times a day (BID) | ORAL | Status: DC
  Administered 2021-08-18 – 2021-08-19 (×2): 81 mg via ORAL
  Filled 2021-08-18 (×2): qty 1

## 2021-08-18 MED ORDER — HYDRALAZINE HCL 20 MG/ML IJ SOLN
10.0000 mg | Freq: Four times a day (QID) | INTRAMUSCULAR | Status: DC | PRN
  Administered 2021-08-18: 10 mg via INTRAVENOUS
  Filled 2021-08-18: qty 1

## 2021-08-18 MED ORDER — MORPHINE SULFATE (PF) 2 MG/ML IV SOLN
0.5000 mg | INTRAVENOUS | Status: DC | PRN
  Administered 2021-08-18 (×2): 1 mg via INTRAVENOUS
  Filled 2021-08-18 (×2): qty 1

## 2021-08-18 MED ORDER — MIDAZOLAM HCL 2 MG/2ML IJ SOLN
INTRAMUSCULAR | Status: AC
  Filled 2021-08-18: qty 2

## 2021-08-18 MED ORDER — DEXAMETHASONE SODIUM PHOSPHATE 10 MG/ML IJ SOLN
INTRAMUSCULAR | Status: AC
  Filled 2021-08-18: qty 1

## 2021-08-18 MED ORDER — HYDROCODONE-ACETAMINOPHEN 5-325 MG PO TABS
ORAL_TABLET | ORAL | Status: AC
  Filled 2021-08-18: qty 1

## 2021-08-18 MED ORDER — MIDAZOLAM HCL 5 MG/5ML IJ SOLN
INTRAMUSCULAR | Status: DC | PRN
  Administered 2021-08-18 (×2): 1 mg via INTRAVENOUS

## 2021-08-18 MED ORDER — ACETAMINOPHEN 325 MG PO TABS: 325.0000 mg | ORAL_TABLET | Freq: Four times a day (QID) | ORAL | Status: DC | PRN

## 2021-08-18 MED ORDER — ONDANSETRON HCL 4 MG/2ML IJ SOLN
INTRAMUSCULAR | Status: AC
  Filled 2021-08-18: qty 2

## 2021-08-18 MED ORDER — FERROUS SULFATE 325 (65 FE) MG PO TABS
325.0000 mg | ORAL_TABLET | Freq: Three times a day (TID) | ORAL | Status: DC
  Administered 2021-08-19: 325 mg via ORAL
  Filled 2021-08-18: qty 1

## 2021-08-18 MED ORDER — STERILE WATER FOR IRRIGATION IR SOLN
Status: DC | PRN
  Administered 2021-08-18: 2000 mL

## 2021-08-18 MED ORDER — HYDROCODONE-ACETAMINOPHEN 7.5-325 MG PO TABS
1.0000 | ORAL_TABLET | ORAL | Status: DC | PRN
  Administered 2021-08-18 – 2021-08-19 (×3): 2 via ORAL
  Filled 2021-08-18 (×3): qty 2

## 2021-08-18 MED ORDER — PROPOFOL 500 MG/50ML IV EMUL
INTRAVENOUS | Status: DC | PRN
  Administered 2021-08-18: 130 ug/kg/min via INTRAVENOUS

## 2021-08-18 MED ORDER — HYDROMORPHONE HCL 1 MG/ML IJ SOLN
0.2500 mg | INTRAMUSCULAR | Status: DC | PRN
  Administered 2021-08-18 (×4): 0.5 mg via INTRAVENOUS

## 2021-08-18 MED ORDER — PROMETHAZINE HCL 25 MG/ML IJ SOLN: 6.2500 mg | INTRAMUSCULAR | Status: DC | PRN

## 2021-08-18 MED ORDER — AMITRIPTYLINE HCL 50 MG PO TABS
50.0000 mg | ORAL_TABLET | Freq: Every day | ORAL | Status: DC
  Administered 2021-08-18: 50 mg via ORAL
  Filled 2021-08-18: qty 1

## 2021-08-18 MED ORDER — TRANEXAMIC ACID-NACL 1000-0.7 MG/100ML-% IV SOLN
1000.0000 mg | Freq: Once | INTRAVENOUS | Status: AC
  Administered 2021-08-18: 1000 mg via INTRAVENOUS
  Filled 2021-08-18: qty 100

## 2021-08-18 MED ORDER — MENTHOL 3 MG MT LOZG: 1.0000 | LOZENGE | OROMUCOSAL | Status: DC | PRN

## 2021-08-18 MED ORDER — DIPHENHYDRAMINE HCL 12.5 MG/5ML PO ELIX
12.5000 mg | ORAL_SOLUTION | ORAL | Status: DC | PRN
  Administered 2021-08-18: 12.5 mg via ORAL
  Filled 2021-08-18: qty 10

## 2021-08-18 MED ORDER — ONDANSETRON HCL 4 MG/2ML IJ SOLN
4.0000 mg | Freq: Four times a day (QID) | INTRAMUSCULAR | Status: DC | PRN
  Administered 2021-08-18: 4 mg via INTRAVENOUS
  Filled 2021-08-18: qty 2

## 2021-08-18 MED ORDER — DEXAMETHASONE SODIUM PHOSPHATE 10 MG/ML IJ SOLN
8.0000 mg | Freq: Once | INTRAMUSCULAR | Status: AC
  Administered 2021-08-18: 8 mg via INTRAVENOUS

## 2021-08-18 MED ORDER — ONDANSETRON HCL 4 MG PO TABS: 4.0000 mg | ORAL_TABLET | Freq: Four times a day (QID) | ORAL | Status: DC | PRN

## 2021-08-18 MED ORDER — SODIUM CHLORIDE 0.9 % IV SOLN: INTRAVENOUS | Status: DC

## 2021-08-18 MED ORDER — ONDANSETRON HCL 4 MG/2ML IJ SOLN
INTRAMUSCULAR | Status: DC | PRN
Start: 2021-08-18 — End: 2021-08-18
  Administered 2021-08-18: 4 mg via INTRAVENOUS

## 2021-08-18 MED ORDER — 0.9 % SODIUM CHLORIDE (POUR BTL) OPTIME
TOPICAL | Status: DC | PRN
  Administered 2021-08-18: 1000 mL

## 2021-08-18 MED ORDER — DEXAMETHASONE SODIUM PHOSPHATE 10 MG/ML IJ SOLN
10.0000 mg | Freq: Once | INTRAMUSCULAR | Status: AC
  Administered 2021-08-19: 10 mg via INTRAVENOUS
  Filled 2021-08-18: qty 1

## 2021-08-18 MED ORDER — HYDROMORPHONE HCL 1 MG/ML IJ SOLN
INTRAMUSCULAR | Status: AC
  Filled 2021-08-18: qty 2

## 2021-08-18 MED ORDER — METOCLOPRAMIDE HCL 5 MG/ML IJ SOLN: 5.0000 mg | Freq: Three times a day (TID) | INTRAMUSCULAR | Status: DC | PRN

## 2021-08-18 MED ORDER — DOCUSATE SODIUM 100 MG PO CAPS
100.0000 mg | ORAL_CAPSULE | Freq: Two times a day (BID) | ORAL | Status: DC
  Administered 2021-08-18 – 2021-08-19 (×2): 100 mg via ORAL
  Filled 2021-08-18 (×2): qty 1

## 2021-08-18 MED ORDER — PROPOFOL 10 MG/ML IV BOLUS
INTRAVENOUS | Status: DC | PRN
  Administered 2021-08-18: 10 mg via INTRAVENOUS
  Administered 2021-08-18: 20 mg via INTRAVENOUS
  Administered 2021-08-18: 30 mg via INTRAVENOUS

## 2021-08-18 MED ORDER — BISACODYL 10 MG RE SUPP: 10.0000 mg | Freq: Every day | RECTAL | Status: DC | PRN

## 2021-08-18 MED ORDER — FENTANYL CITRATE (PF) 100 MCG/2ML IJ SOLN
INTRAMUSCULAR | Status: AC
  Filled 2021-08-18: qty 2

## 2021-08-18 MED ORDER — HYDROCODONE-ACETAMINOPHEN 5-325 MG PO TABS
1.0000 | ORAL_TABLET | ORAL | Status: DC | PRN
  Administered 2021-08-18: 1 via ORAL
  Filled 2021-08-18: qty 2

## 2021-08-18 MED ORDER — POVIDONE-IODINE 10 % EX SWAB: 2.0000 "application " | Freq: Once | CUTANEOUS | Status: DC

## 2021-08-18 MED ORDER — METOCLOPRAMIDE HCL 5 MG PO TABS: 5.0000 mg | ORAL_TABLET | Freq: Three times a day (TID) | ORAL | Status: DC | PRN

## 2021-08-18 MED ORDER — PROPOFOL 1000 MG/100ML IV EMUL
INTRAVENOUS | Status: AC
  Filled 2021-08-18: qty 100

## 2021-08-18 MED ORDER — MEPERIDINE HCL 50 MG/ML IJ SOLN: 6.2500 mg | INTRAMUSCULAR | Status: DC | PRN

## 2021-08-18 MED ORDER — FENTANYL CITRATE (PF) 100 MCG/2ML IJ SOLN
INTRAMUSCULAR | Status: DC | PRN
  Administered 2021-08-18 (×2): 50 ug via INTRAVENOUS

## 2021-08-18 MED ORDER — LACTATED RINGERS IV SOLN: INTRAVENOUS | Status: DC

## 2021-08-18 MED ORDER — CEFAZOLIN SODIUM-DEXTROSE 2-4 GM/100ML-% IV SOLN
2.0000 g | Freq: Four times a day (QID) | INTRAVENOUS | Status: AC
  Administered 2021-08-18 (×2): 2 g via INTRAVENOUS
  Filled 2021-08-18 (×2): qty 100

## 2021-08-18 MED ORDER — CELECOXIB 200 MG PO CAPS
200.0000 mg | ORAL_CAPSULE | Freq: Once | ORAL | Status: AC
  Administered 2021-08-18: 200 mg via ORAL
  Filled 2021-08-18: qty 1

## 2021-08-18 MED ORDER — CEFAZOLIN SODIUM-DEXTROSE 2-4 GM/100ML-% IV SOLN
2.0000 g | INTRAVENOUS | Status: AC
  Administered 2021-08-18: 2 g via INTRAVENOUS
  Filled 2021-08-18: qty 100

## 2021-08-18 MED ORDER — PHENYLEPHRINE HCL-NACL 20-0.9 MG/250ML-% IV SOLN
INTRAVENOUS | Status: AC
  Filled 2021-08-18: qty 250

## 2021-08-18 MED ORDER — BUPIVACAINE IN DEXTROSE 0.75-8.25 % IT SOLN
INTRATHECAL | Status: DC | PRN
  Administered 2021-08-18: 1.6 mL via INTRATHECAL

## 2021-08-18 SURGICAL SUPPLY — 42 items
BAG COUNTER SPONGE SURGICOUNT (BAG) IMPLANT
BAG DECANTER FOR FLEXI CONT (MISCELLANEOUS) IMPLANT
BAG ZIPLOCK 12X15 (MISCELLANEOUS) IMPLANT
BLADE SAG 18X100X1.27 (BLADE) ×2 IMPLANT
COVER PERINEAL POST (MISCELLANEOUS) ×2 IMPLANT
COVER SURGICAL LIGHT HANDLE (MISCELLANEOUS) ×2 IMPLANT
CUP ACET PINNACLE SECTR 50MM (Hips) IMPLANT
DERMABOND ADVANCED (GAUZE/BANDAGES/DRESSINGS) ×1
DERMABOND ADVANCED .7 DNX12 (GAUZE/BANDAGES/DRESSINGS) ×1 IMPLANT
DRAPE FOOT SWITCH (DRAPES) ×2 IMPLANT
DRAPE STERI IOBAN 125X83 (DRAPES) ×2 IMPLANT
DRAPE U-SHAPE 47X51 STRL (DRAPES) ×4 IMPLANT
DRESSING AQUACEL AG SP 3.5X10 (GAUZE/BANDAGES/DRESSINGS) ×1 IMPLANT
DRSG AQUACEL AG ADV 3.5X10 (GAUZE/BANDAGES/DRESSINGS) ×1 IMPLANT
DRSG AQUACEL AG SP 3.5X10 (GAUZE/BANDAGES/DRESSINGS) ×2
DURAPREP 26ML APPLICATOR (WOUND CARE) ×2 IMPLANT
ELECT REM PT RETURN 15FT ADLT (MISCELLANEOUS) ×2 IMPLANT
ELIMINATOR HOLE APEX DEPUY (Hips) ×1 IMPLANT
GLOVE SURG ENC MOIS LTX SZ6 (GLOVE) ×2 IMPLANT
GLOVE SURG ENC MOIS LTX SZ7 (GLOVE) ×2 IMPLANT
GLOVE SURG UNDER LTX SZ6.5 (GLOVE) ×2 IMPLANT
GLOVE SURG UNDER POLY LF SZ7.5 (GLOVE) ×2 IMPLANT
GOWN STRL REUS W/TWL LRG LVL3 (GOWN DISPOSABLE) ×4 IMPLANT
HEAD FEMORAL 32 CERAMIC (Hips) ×1 IMPLANT
HOLDER FOLEY CATH W/STRAP (MISCELLANEOUS) ×2 IMPLANT
KIT TURNOVER KIT A (KITS) IMPLANT
LINER ACET PNNCL PLUS4 NEUTRAL (Hips) IMPLANT
PACK ANTERIOR HIP CUSTOM (KITS) ×2 IMPLANT
PINNACLE PLUS 4 NEUTRAL (Hips) ×2 IMPLANT
PINNACLE SECTOR CUP 50MM (Hips) ×2 IMPLANT
SCREW 6.5MMX25MM (Screw) ×1 IMPLANT
SPONGE T-LAP 18X18 ~~LOC~~+RFID (SPONGE) ×6 IMPLANT
STEM FEM ACTIS HIGH SZ2 (Stem) ×1 IMPLANT
SUT MNCRL AB 4-0 PS2 18 (SUTURE) ×2 IMPLANT
SUT STRATAFIX 0 PDS 27 VIOLET (SUTURE) ×2
SUT VIC AB 1 CT1 36 (SUTURE) ×6 IMPLANT
SUT VIC AB 2-0 CT1 27 (SUTURE) ×2
SUT VIC AB 2-0 CT1 TAPERPNT 27 (SUTURE) ×2 IMPLANT
SUTURE STRATFX 0 PDS 27 VIOLET (SUTURE) ×1 IMPLANT
TRAY FOLEY MTR SLVR 16FR STAT (SET/KITS/TRAYS/PACK) IMPLANT
TUBE SUCTION HIGH CAP CLEAR NV (SUCTIONS) ×2 IMPLANT
WATER STERILE IRR 1000ML POUR (IV SOLUTION) ×2 IMPLANT

## 2021-08-18 NOTE — H&P (Signed)
TOTAL HIP ADMISSION H&P  Patient is admitted for left total hip arthroplasty.  Subjective:  Chief Complaint: left hip pain  HPI: Kristin Montes, 46 y.o. female, has a history of pain and functional disability in the left hip(s) due to arthritis and patient has failed non-surgical conservative treatments for greater than 12 weeks to include NSAID's and/or analgesics and activity modification.  Onset of symptoms was gradual starting 1 years ago with gradually worsening course since that time.The patient noted no past surgery on the left hip(s).  Patient currently rates pain in the left hip at 9 out of 10 with activity. Patient has worsening of pain with activity and weight bearing, pain that interfers with activities of daily living, and pain with passive range of motion. Patient has evidence of joint space narrowing by imaging studies. This condition presents safety issues increasing the risk of falls. There is no current active infection.  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   Past Medical History:  Diagnosis Date   Arthritis    Complication of anesthesia    slow to wake up with nephrectomy   Depression    Headache    History of kidney stones    Pneumonia     Past Surgical History:  Procedure Laterality Date   HERNIA REPAIR     NEPHRECTOMY     TUBAL LIGATION      Current Facility-Administered Medications  Medication Dose Route Frequency Provider Last Rate Last Admin   acetaminophen (TYLENOL) tablet 1,000 mg  1,000 mg Oral Once Nolon Nations, MD       ceFAZolin (ANCEF) IVPB 2g/100 mL premix  2 g Intravenous On Call to OR Irving Copas, PA-C       celecoxib (CELEBREX) capsule 200 mg  200 mg Oral Once Nolon Nations, MD       dexamethasone (DECADRON) injection 8 mg  8 mg  Intravenous Once Irving Copas, PA-C       lactated ringers infusion   Intravenous Continuous Costella Hatcher R, PA-C       povidone-iodine 10 % swab 2 application  2 application Topical Once Irving Copas, PA-C       tranexamic acid (CYKLOKAPRON) IVPB 1,000 mg  1,000 mg Intravenous To OR Irving Copas, PA-C       No Known Allergies  Social History   Tobacco Use   Smoking status: Every Day    Packs/day: 0.25    Types: Cigarettes   Smokeless tobacco: Never   Tobacco comments:    6 cigs/day  Substance Use Topics   Alcohol use: Not Currently    No family history on file.   Review of Systems  Constitutional:  Negative for chills and fever.  Respiratory:  Negative for cough and shortness of breath.   Cardiovascular:  Negative for chest pain.  Gastrointestinal:  Negative for nausea and vomiting.  Musculoskeletal:  Positive for arthralgias.   Objective:  Physical Exam Well nourished and well developed. General: Alert and oriented x3, cooperative and pleasant, no acute distress. Head: normocephalic, atraumatic, neck supple. Eyes: EOMI.  Musculoskeletal: Left hip exam: Examination findings are unchanged with painful limited hip flexion internal rotation to 10 degrees with external rotation close to 30 degrees both with pain anteriorly No significant lower extremity edema or erythema  Calves soft and nontender. Motor function intact in LE. Strength 5/5 LE bilaterally. Neuro: Distal pulses 2+. Sensation to light touch intact in LE.  Vital signs in last 24 hours:    Labs:   Estimated body mass index is 29.01 kg/m as calculated from the following:   Height as of 08/14/21: 5\' 4"  (1.626 m).   Weight as of 08/14/21: 76.7 kg.   Imaging Review Plain radiographs demonstrate severe degenerative joint disease of the left hip(s). The bone quality appears to be adequate for age and reported activity level.      Assessment/Plan:  End stage arthritis, left hip(s)  The  patient history, physical examination, clinical judgement of the provider and imaging studies are consistent with end stage degenerative joint disease of the left hip(s) and total hip arthroplasty is deemed medically necessary. The treatment options including medical management, injection therapy, arthroscopy and arthroplasty were discussed at length. The risks and benefits of total hip arthroplasty were presented and reviewed. The risks due to aseptic loosening, infection, stiffness, dislocation/subluxation,  thromboembolic complications and other imponderables were discussed.  The patient acknowledged the explanation, agreed to proceed with the plan and consent was signed. Patient is being admitted for inpatient treatment for surgery, pain control, PT, OT, prophylactic antibiotics, VTE prophylaxis, progressive ambulation and ADL's and discharge planning.The patient is planning to be discharged  home.  Therapy Plans: HEP Disposition: Home with son and his girlfriend Planned DVT Prophylaxis: aspirin 81mg  BID DME needed: walker PCP: Dr. Redmond Pulling, clearance received TXA: IV Allergies: NKDA Anesthesia Concerns: slow to wake up BMI: 28.2 Last HgbA1c: Not diabetic  Other: - Hydrocodone, tizanidine - hx of total nephrectomy due to kidney stones - no NSAIDs    Costella Hatcher, PA-C Orthopedic Surgery [facility] [place] [phone]

## 2021-08-18 NOTE — Op Note (Signed)
NAME:  Kristin Montes.: [id number]      MEDICAL RECORD NO.: [id number]      FACILITY:  [facility]      PHYSICIAN:  Shelda Pal  DATE OF BIRTH:  18-Aug-1975     DATE OF PROCEDURE:  08/18/2021                                 OPERATIVE REPORT         PREOPERATIVE DIAGNOSIS: Left  hip osteoarthritis.      POSTOPERATIVE DIAGNOSIS:  Left hip osteoarthritis.      PROCEDURE:  Left total hip replacement through an anterior approach   utilizing DePuy THR system, component size 50 mm pinnacle cup, a size 32+4 neutral   Altrex liner, a size 2 Hi Actis stem with a 32+1 delta ceramic   ball.      SURGEON:  Madlyn Frankel. Charlann Boxer, M.D.      ASSISTANT:  Rosalene Billings, PA-C     ANESTHESIA:  Spinal.      SPECIMENS:  None.      COMPLICATIONS:  None.      BLOOD LOSS:  350 cc     DRAINS:  None.      INDICATION OF THE PROCEDURE:  Kristin Montes is a 46 y.o. female who had   presented to office for evaluation of left hip pain.  Radiographs revealed   progressive degenerative changes with bone-on-bone   articulation of the  hip joint, including subchondral cystic changes and osteophytes.  The patient had painful limited range of   motion significantly affecting their overall quality of life and function.  The patient was failing to    respond to conservative measures including medications and/or injections and activity modification and at this point was ready   to proceed with more definitive measures.  Consent was obtained for   benefit of pain relief.  Specific risks of infection, DVT, component   failure, dislocation, neurovascular injury, and need for revision surgery were reviewed in the office as well discussion of   the anterior versus posterior approach were reviewed.     PROCEDURE IN DETAIL:  The patient was brought to operative theater.   Once adequate anesthesia, preoperative antibiotics, 2 gm of Ancef, 1 gm of Tranexamic Acid, and 10 mg of  Decadron were administered, the patient was positioned supine on the [employer] table.  Once the patient was safely positioned with adequate padding of boney prominences we predraped out the hip, and used fluoroscopy to confirm orientation of the pelvis.      The left hip was then prepped and draped from proximal iliac crest to   mid thigh with a shower curtain technique.      Time-out was performed identifying the patient, planned procedure, and the appropriate extremity.     An incision was then made 2 cm lateral to the   anterior superior iliac spine extending over the orientation of the   tensor fascia lata muscle and sharp dissection was carried down to the   fascia of the muscle.      The fascia was then incised.  The muscle belly was identified and swept   laterally and retractor placed along the superior neck.  Following   cauterization of the circumflex vessels and removing some pericapsular  fat, a second cobra retractor was placed on the inferior neck.  A T-capsulotomy was made along the line of the   superior neck to the trochanteric fossa, then extended proximally and   distally.  Tag sutures were placed and the retractors were then placed   intracapsular.  We then identified the trochanteric fossa and   orientation of my neck cut and then made a neck osteotomy with the femur on traction.  The femoral   head was removed without difficulty or complication.  Traction was let   off and retractors were placed posterior and anterior around the   acetabulum.      The labrum and foveal tissue were debrided.  I began reaming with a 45 mm   reamer and reamed up to 49 mm reamer with good bony bed preparation and a 50 mm  cup was chosen.  The final 50 mm Pinnacle cup was then impacted under fluoroscopy to confirm the depth of penetration and orientation with respect to   Abduction and forward flexion.  A screw was placed into the ilium followed by the hole eliminator.  The final   32+4  neutral Altrex liner was impacted with good visualized rim fit.  The cup was positioned anatomically within the acetabular portion of the pelvis.      At this point, the femur was rolled to 100 degrees.  Further capsule was   released off the inferior aspect of the femoral neck.  I then   released the superior capsule proximally.  With the leg in a neutral position the hook was placed laterally   along the femur under the vastus lateralis origin and elevated manually and then held in position using the hook attachment on the bed.  The leg was then extended and adducted with the leg rolled to 100   degrees of external rotation.  Retractors were placed along the medial calcar and posteriorly over the greater trochanter.  Once the proximal femur was fully   exposed, I used a box osteotome to set orientation.  I then began   broaching with the starting chili pepper broach and passed this by hand and then broached up to 2.  With the 2 broach in place I chose a high offset neck and did several trial reductions.  The offset was appropriate, leg lengths   appeared to be equal best matched with the +1 head ball trial confirmed radiographically.   Given these findings, I went ahead and dislocated the hip, repositioned all   retractors and positioned the right hip in the extended and abducted position.  The final 2 Hi Actis stem was   chosen and it was impacted down to the level of neck cut.  Based on this   and the trial reductions, a final 32+1 delta ceramic ball was chosen and   impacted onto a clean and dry trunnion, and the hip was reduced.  The   hip had been irrigated throughout the case again at this point.  I did   reapproximate the superior capsular leaflet to the anterior leaflet   using #1 Vicryl.  The fascia of the   tensor fascia lata muscle was then reapproximated using #1 Vicryl and #0 Stratafix sutures.  The   remaining wound was closed with 2-0 Vicryl and running 4-0 Monocryl.   The hip  was cleaned, dried, and dressed sterilely using Dermabond and   Aquacel dressing.  The patient was then brought   to recovery room in  stable condition tolerating the procedure well.    Rosalene Billings, PA-C was present for the entirety of the case involved from   preoperative positioning, perioperative retractor management, general   facilitation of the case, as well as primary wound closure as assistant.            Madlyn Frankel Charlann Boxer, M.D.        08/18/2021 8:53 AM

## 2021-08-18 NOTE — Transfer of Care (Signed)
Immediate Anesthesia Transfer of Care Note  Patient: Kristin Montes  Procedure(s) Performed: TOTAL HIP ARTHROPLASTY ANTERIOR APPROACH (Left: Hip)  Patient Location: PACU  Anesthesia Type:Spinal  Level of Consciousness: sedated  Airway & Oxygen Therapy: Patient Spontanous Breathing and Patient connected to face mask oxygen  Post-op Assessment: Report given to RN and Post -op Vital signs reviewed and stable  Post vital signs: Reviewed and stable  Last Vitals:  Vitals Value Taken Time  BP    Temp    Pulse    Resp 19 08/18/21 1031  SpO2    Vitals shown include unvalidated device data.  Last Pain:  Vitals:   08/18/21 0702  TempSrc:   PainSc: 4       Patients Stated Pain Goal: 4 ([id number])  Complications: No notable events documented.

## 2021-08-18 NOTE — Discharge Instructions (Signed)

## 2021-08-18 NOTE — Evaluation (Signed)
Physical Therapy Evaluation Patient Details Name: Kristin Montes MRN: [id number] DOB: 11/12/1975 Today's Date: 08/18/2021  History of Present Illness  Pt is 46 yo female admitted on 08/18/21 for L anterior THA.  Pt with hx including but not limited to arthritis, nephrectomy, depression.  Clinical Impression  Pt is s/p TKA resulting in the deficits listed below (see PT Problem List). At baseline, pt is independent.  She will have near 24 hr support initially from children. Does not have DME.  Today, pt willing to participate but limited by pain.  Requiring min A for transfers and ambulated 16' in room.  Continue to progress as able.  Pt will benefit from skilled PT to increase their independence and safety with mobility to allow discharge to the venue listed below.         Recommendations for follow up therapy are one component of a multi-disciplinary discharge planning process, led by the attending physician.  Recommendations may be updated based on patient status, additional functional criteria and insurance authorization.  Follow Up Recommendations Follow physician's recommendations for discharge plan and follow up therapies    Assistance Recommended at Discharge Intermittent Supervision/Assistance  Patient can return home with the following  A little help with walking and/or transfers;A little help with bathing/dressing/bathroom;Assistance with cooking/housework;Help with stairs or ramp for entrance    Equipment Recommendations Rolling walker (2 wheels)  Recommendations for Other Services       Functional Status Assessment Patient has had a recent decline in their functional status and demonstrates the ability to make significant improvements in function in a reasonable and predictable amount of time.     Precautions / Restrictions Precautions Precautions: Fall Restrictions Weight Bearing Restrictions: Yes LLE Weight Bearing: Weight bearing as tolerated      Mobility  Bed  Mobility Overal bed mobility: Needs Assistance Bed Mobility: Supine to Sit     Supine to sit: Min assist     General bed mobility comments: Min A for L LE    Transfers Overall transfer level: Needs assistance Equipment used: Rolling walker (2 wheels) Transfers: Sit to/from Stand Sit to Stand: Min guard           General transfer comment: Cues for hand placement    Ambulation/Gait Ambulation/Gait assistance: Min guard Gait Distance (Feet): 16 Feet Assistive device: Rolling walker (2 wheels) Gait Pattern/deviations: Step-to pattern, Decreased stride length, Decreased weight shift to left Gait velocity: decreased     General Gait Details: Slow gait, cues to not pivot and for RW, steady, min guard for safety  Stairs            Wheelchair Mobility    Modified Rankin (Stroke Patients Only)       Balance Overall balance assessment: Needs assistance Sitting-balance support: No upper extremity supported Sitting balance-Leahy Scale: Good     Standing balance support: Bilateral upper extremity supported, Reliant on assistive device for balance Standing balance-Leahy Scale: Poor Standing balance comment: Steady with RW                             Pertinent Vitals/Pain Pain Assessment Pain Assessment: 0-10 Pain Score: 8  Pain Location: L hip Pain Descriptors / Indicators: Discomfort, Sore Pain Intervention(s): Premedicated before session, Monitored during session, Limited activity within patient's tolerance, Repositioned    Home Living Family/patient expects to be discharged to:: Private residence Living Arrangements: Children Available Help at Discharge: Family;Available 24 hours/day Type of Home: Apartment Home  Access: Stairs to enter Entrance Stairs-Rails: None Entrance Stairs-Number of Steps: 2   Home Layout: One level Home Equipment: None      Prior Function Prior Level of Function : Independent/Modified Independent;Driving                      Hand Dominance        Extremity/Trunk Assessment   Upper Extremity Assessment Upper Extremity Assessment: Overall WFL for tasks assessed    Lower Extremity Assessment Lower Extremity Assessment: LLE deficits/detail;RLE deficits/detail RLE Deficits / Details: ROM WFL; MMT 5/5 LLE Deficits / Details: ROM: WFL; MMT: hip 1/5, knee 3/5 not further tested, ankle 5/5 LLE Sensation: WNL    Cervical / Trunk Assessment Cervical / Trunk Assessment: Normal;Other exceptions Cervical / Trunk Exceptions: Reports arthritis in lumbar and cervical region  Communication   Communication: No difficulties  Cognition Arousal/Alertness: Awake/alert Behavior During Therapy: WFL for tasks assessed/performed Overall Cognitive Status: Within Functional Limits for tasks assessed                                          General Comments General comments (skin integrity, edema, etc.): VSS; Educated on PT role and POC; Pt motivated to try today - pain unchanged with mobility    Exercises     Assessment/Plan    PT Assessment Patient needs continued PT services  PT Problem List Decreased strength;Decreased mobility;Decreased range of motion;Decreased activity tolerance;Decreased balance;Decreased knowledge of use of DME;Pain       PT Treatment Interventions DME instruction;Therapeutic activities;Gait training;Therapeutic exercise;Patient/family education;Modalities;Stair training;Balance training;Functional mobility training    PT Goals (Current goals can be found in the Care Plan section)  Acute Rehab PT Goals Patient Stated Goal: return home PT Goal Formulation: With patient Time For Goal Achievement: 09/01/21 Potential to Achieve Goals: Good    Frequency 7X/week     Co-evaluation               AM-PAC PT "6 Clicks" Mobility  Outcome Measure Help needed turning from your back to your side while in a flat bed without using bedrails?: A  Little Help needed moving from lying on your back to sitting on the side of a flat bed without using bedrails?: A Little Help needed moving to and from a bed to a chair (including a wheelchair)?: A Little Help needed standing up from a chair using your arms (e.g., wheelchair or bedside chair)?: A Little Help needed to walk in hospital room?: A Little Help needed climbing 3-5 steps with a railing? : A Lot 6 Click Score: 17    End of Session Equipment Utilized During Treatment: Gait belt Activity Tolerance: Patient tolerated treatment well Patient left: with call bell/phone within reach;in chair (no chair alarm box in room, has pad in place, knows to call to get up) Nurse Communication: Mobility status PT Visit Diagnosis: Other abnormalities of gait and mobility (R26.89);Muscle weakness (generalized) (M62.81)    Time: 1517-6160 PT Time Calculation (min) (ACUTE ONLY): 24 min   Charges:   PT Evaluation $PT Eval Low Complexity: 1 Low PT Treatments $Gait Training: 8-22 mins        Anise Salvo, PT Acute Rehab Services Pager [phone] [name] [phone]   Rayetta Humphrey 08/18/2021, 2:21 PM

## 2021-08-18 NOTE — Anesthesia Postprocedure Evaluation (Signed)
Anesthesia Post Note  Patient: Chardonay Scritchfield  Procedure(s) Performed: TOTAL HIP ARTHROPLASTY ANTERIOR APPROACH (Left: Hip)     Patient location during evaluation: PACU Anesthesia Type: Spinal Level of consciousness: awake and alert Pain management: pain level controlled Vital Signs Assessment: post-procedure vital signs reviewed and stable Respiratory status: spontaneous breathing Cardiovascular status: stable Anesthetic complications: no   No notable events documented.  Last Vitals:  Vitals:   08/18/21 1302 08/18/21 1500  BP: (!) 146/108 (!) 178/100  Pulse: 60 79  Resp: 14 16  Temp: 36.6 C 36.5 C  SpO2: 100% 100%    Last Pain:  Vitals:   08/18/21 1500  TempSrc: Oral  PainSc:                  Lewie Loron

## 2021-08-18 NOTE — Anesthesia Procedure Notes (Addendum)
Spinal  Start time: 08/18/2021 8:46 AM End time: 08/18/2021 8:53 AM Reason for block: surgical anesthesia Staffing Performed: anesthesiologist  Anesthesiologist: Nolon Nations, MD Resident/CRNA: Lind Covert, CRNA Preanesthetic Checklist Completed: patient identified, IV checked, site marked, risks and benefits discussed, surgical consent, monitors and equipment checked, pre-op evaluation and timeout performed Spinal Block Patient position: sitting Prep: DuraPrep Patient monitoring: heart rate, cardiac monitor, continuous pulse ox and blood pressure Approach: right paramedian Location: L2-3 Injection technique: single-shot Needle Needle type: Pencan  Needle gauge: 24 G Needle length: 10 cm Needle insertion depth: 7 cm Assessment Sensory level: T6 Events: second provider Additional Notes Patient timeout performed. Patient sat on side of stretcher. L3-4 identified. Attempted x 1 per [place] CRNA midline. Dr. Lissa Hoard attempt x 1 in same location, unsuccessful. Second attempt with R paramedian approach with +csf. 1.6 ml Bupivacaine with dextrose injected.

## 2021-08-19 ENCOUNTER — Encounter (HOSPITAL_COMMUNITY): Payer: Self-pay | Admitting: Orthopedic Surgery

## 2021-08-19 DIAGNOSIS — M1612 Unilateral primary osteoarthritis, left hip: Secondary | ICD-10-CM | POA: Diagnosis not present

## 2021-08-19 LAB — CBC
HCT: 33.1 % — ABNORMAL LOW (ref 36.0–46.0)
Hemoglobin: 10.9 g/dL — ABNORMAL LOW (ref 12.0–15.0)
MCH: 31.7 pg (ref 26.0–34.0)
MCHC: 32.9 g/dL (ref 30.0–36.0)
MCV: 96.2 fL (ref 80.0–100.0)
Platelets: 199 10*3/uL (ref 150–400)
RBC: 3.44 MIL/uL — ABNORMAL LOW (ref 3.87–5.11)
RDW: 13.4 % (ref 11.5–15.5)
WBC: 9.3 10*3/uL (ref 4.0–10.5)
nRBC: 0 % (ref 0.0–0.2)

## 2021-08-19 LAB — BASIC METABOLIC PANEL
Anion gap: 9 (ref 5–15)
BUN: 13 mg/dL (ref 6–20)
CO2: 26 mmol/L (ref 22–32)
Calcium: 8.9 mg/dL (ref 8.9–10.3)
Chloride: 103 mmol/L (ref 98–111)
Creatinine, Ser: 0.71 mg/dL (ref 0.44–1.00)
GFR, Estimated: >60 mL/min (ref >60)
Glucose, Bld: 130 mg/dL — ABNORMAL HIGH (ref 70–99)
Potassium: 4 mmol/L (ref 3.5–5.1)
Sodium: 138 mmol/L (ref 135–145)

## 2021-08-19 MED ORDER — OXYCODONE HCL 5 MG PO TABS: 5.0000 mg | ORAL_TABLET | ORAL | 0 refills | Status: DC | PRN

## 2021-08-19 MED ORDER — OXYCODONE HCL 5 MG PO TABS: 5.0000 mg | ORAL_TABLET | ORAL | Status: DC | PRN

## 2021-08-19 MED ORDER — TIZANIDINE HCL 4 MG PO TABS: 4.0000 mg | ORAL_TABLET | Freq: Three times a day (TID) | ORAL | 0 refills | Status: DC | PRN

## 2021-08-19 MED ORDER — ASPIRIN 81 MG PO CHEW: 81.0000 mg | CHEWABLE_TABLET | Freq: Two times a day (BID) | ORAL | 0 refills | Status: AC

## 2021-08-19 MED ORDER — DOCUSATE SODIUM 100 MG PO CAPS: 100.0000 mg | ORAL_CAPSULE | Freq: Two times a day (BID) | ORAL | 0 refills | Status: DC

## 2021-08-19 MED ORDER — OXYCODONE HCL 5 MG PO TABS
10.0000 mg | ORAL_TABLET | ORAL | Status: DC | PRN
  Administered 2021-08-19: 15 mg via ORAL
  Filled 2021-08-19: qty 3

## 2021-08-19 MED ORDER — POLYETHYLENE GLYCOL 3350 17 G PO PACK: 17.0000 g | PACK | Freq: Every day | ORAL | 0 refills | Status: DC | PRN

## 2021-08-19 MED ORDER — ACETAMINOPHEN 325 MG PO TABS: 1000.0000 mg | ORAL_TABLET | Freq: Four times a day (QID) | ORAL | Status: DC | PRN

## 2021-08-19 NOTE — Progress Notes (Signed)
Physical Therapy Treatment Patient Details Name: Kristin Montes MRN: [id number] DOB: 04-Jul-1976 Today's Date: 08/19/2021   History of Present Illness Pt is 46 yo female admitted on 08/18/21 for L anterior THA.  Pt with hx including but not limited to arthritis, nephrectomy, depression.    PT Comments    Pt is POD # 1 and is progressing well.  Her pain control is much improved and she was able to ambulate 100' and perform 2 steps backward with RW to simulate home environment.  Pt motivated and eager to return home.  Pt demonstrates safe gait & transfers in order to return home from PT perspective once discharged by MD.  While in hospital, will continue to benefit from PT for skilled therapy to advance mobility and exercises.      Recommendations for follow up therapy are one component of a multi-disciplinary discharge planning process, led by the attending physician.  Recommendations may be updated based on patient status, additional functional criteria and insurance authorization.  Follow Up Recommendations  Follow physician's recommendations for discharge plan and follow up therapies     Assistance Recommended at Discharge Intermittent Supervision/Assistance  Patient can return home with the following Help with stairs or ramp for entrance   Equipment Recommendations  Rolling walker (2 wheels)    Recommendations for Other Services       Precautions / Restrictions Precautions Precautions: Fall Restrictions Weight Bearing Restrictions: No LLE Weight Bearing: Weight bearing as tolerated     Mobility  Bed Mobility               General bed mobility comments: up at arrival    Transfers Overall transfer level: Needs assistance Equipment used: Rolling walker (2 wheels) Transfers: Sit to/from Stand Sit to Stand: Supervision           General transfer comment: performed safely    Ambulation/Gait Ambulation/Gait assistance: Supervision Gait Distance (Feet): 100  Feet Assistive device: Rolling walker (2 wheels) Gait Pattern/deviations: Step-through pattern, Decreased stride length, Decreased weight shift to left, Step-to pattern Gait velocity: decreased     General Gait Details: slow but steady gait; varied between step to ad step through; good RW proximity; slight decrease in weigt shift to L   Stairs Stairs: Yes Stairs assistance: Min guard   Number of Stairs: 4 General stair comments: Up/down 2 steps with bil rails and min guard educated on sequencing; Up/down 2 steps backward with RW and min guard educated on sequence and provided handout; familty present and educated on how to assist   Wheelchair Mobility    Modified Rankin (Stroke Patients Only)       Balance Overall balance assessment: Needs assistance Sitting-balance support: No upper extremity supported Sitting balance-Leahy Scale: Good     Standing balance support: Bilateral upper extremity supported, No upper extremity supported Standing balance-Leahy Scale: Good Standing balance comment: RW to abulate but could stand/weight shift/take 1-2 steps without RW                            Cognition Arousal/Alertness: Awake/alert Behavior During Therapy: WFL for tasks assessed/performed Overall Cognitive Status: Within Functional Limits for tasks assessed                                          Exercises Total Joint Exercises Ankle Circles/Pumps: AROM, Both, 10 reps, Seated  Quad Sets: AROM, Both, 5 reps, Supine Heel Slides: AAROM, Left, 5 reps, Supine Hip ABduction/ADduction: AAROM, Left, 5 reps, Supine Long Arc Quad: AROM, Left, 5 reps, Seated Other Exercises Other Exercises: Educated on correct exercise form within pain control and AAROM techniques as needed.  Educated on progressing to standing exercises at week 2    General Comments   Educated on safe ice use, no pivots, car transfers. Also, encouraged walking every 1-2 hours during  day. Educated on HEP with focus on mobility the first weeks. Discussed doing exercises within pain control and if pain increasing could decreased ROM, reps, and stop exercises as needed. Encouraged to perform  ankle pumps frequently for blood flow .     Pertinent Vitals/Pain Pain Assessment Pain Assessment: 0-10 Pain Score: 2  Pain Location: L hip Pain Descriptors / Indicators: Discomfort, Sore Pain Intervention(s): Limited activity within patient's tolerance, Monitored during session, Premedicated before session    Home Living                          Prior Function            PT Goals (current goals can now be found in the care plan section) Progress towards PT goals: Progressing toward goals    Frequency    7X/week      PT Plan Current plan remains appropriate    Co-evaluation              AM-PAC PT "6 Clicks" Mobility   Outcome Measure  Help needed turning from your back to your side while in a flat bed without using bedrails?: None Help needed moving from lying on your back to sitting on the side of a flat bed without using bedrails?: A Little Help needed moving to and from a bed to a chair (including a wheelchair)?: A Little Help needed standing up from a chair using your arms (e.g., wheelchair or bedside chair)?: A Little Help needed to walk in hospital room?: A Little Help needed climbing 3-5 steps with a railing? : A Little 6 Click Score: 19    End of Session Equipment Utilized During Treatment: Gait belt Activity Tolerance: Patient tolerated treatment well Patient left: in chair;with family/visitor present;with nursing/sitter in room Nurse Communication: Mobility status PT Visit Diagnosis: Other abnormalities of gait and mobility (R26.89);Muscle weakness (generalized) (M62.81)     Time: 1206-1224 PT Time Calculation (min) (ACUTE ONLY): 18 min  Charges:  $Gait Training: 8-22 mins                     Anise Salvo, PT Acute Rehab  Services Pager [phone] [name] [phone]    Rayetta Humphrey 08/19/2021, 12:34 PM

## 2021-08-19 NOTE — Progress Notes (Signed)
° °  Subjective: 1 Day Post-Op Procedure(s) (LRB): TOTAL HIP ARTHROPLASTY ANTERIOR APPROACH (Left) Patient reports pain as moderate.   Patient seen in rounds for Dr. Charlann Boxer. Patient is resting in bed on exam this morning. She is in good spirits but reports her pain was significant overnight. She has done well with oxycodone in the past. Ambulated 16 feet with PT.  We will start therapy today.   Objective: Vital signs in last 24 hours: Temp:  [97.2 F (36.2 C)-98.2 F (36.8 C)] 98.2 F (36.8 C) (02/08 0932) Pulse Rate:  [60-104] 89 (02/08 0932) Resp:  [12-20] 16 (02/08 0932) BP: (108-178)/(78-114) 161/87 (02/08 0932) SpO2:  [97 %-100 %] 100 % (02/08 0932)  Intake/Output from previous day:  Intake/Output Summary (Last 24 hours) at 08/19/2021 0954 Last data filed at 08/19/2021 0940 Gross per 24 hour  Intake 3377.22 ml  Output 3375 ml  Net 2.22 ml     Intake/Output this shift: Total I/O In: 516 [I.V.:516] Out: 900 [Urine:900]  Labs: Recent Labs    08/19/21 0306  HGB 10.9*   Recent Labs    08/19/21 0306  WBC 9.3  RBC 3.44*  HCT 33.1*  PLT 199   Recent Labs    08/19/21 0306  NA 138  K 4.0  CL 103  CO2 26  BUN 13  CREATININE 0.71  GLUCOSE 130*  CALCIUM 8.9   No results for input(s): LABPT, INR in the last 72 hours.  Exam: General - Patient is Alert and Oriented Extremity - Neurologically intact Sensation intact distally Intact pulses distally Dorsiflexion/Plantar flexion intact Dressing - dressing C/D/I Motor Function - intact, moving foot and toes well on exam.   Past Medical History:  Diagnosis Date   Arthritis    Complication of anesthesia    slow to wake up with nephrectomy   Depression    Headache    History of kidney stones    Pneumonia     Assessment/Plan: 1 Day Post-Op Procedure(s) (LRB): TOTAL HIP ARTHROPLASTY ANTERIOR APPROACH (Left) Principal Problem:   S/P total left hip arthroplasty  Estimated body mass index is 28.25 kg/m as  calculated from the following:   Height as of this encounter: 5\' 4"  (1.626 m).   Weight as of this encounter: 74.7 kg. Advance diet Up with therapy D/C IV fluids  DVT Prophylaxis - Aspirin Weight bearing as tolerated.  Will switch to oxycodone today for improved pain control.   Plan is to go Home after hospital stay. Plan for discharge home after working with PT as long as she is meeting her goals. Follow up in the office in 2 weeks.   , PA-C Orthopedic Surgery [phone] 08/19/2021, 9:54 AM

## 2021-08-19 NOTE — Progress Notes (Signed)
Patient discharged to home w/ family. Given all belongings, instructions, equipment. Verbalized understanding of instructions. Escorted to pov via w/c. 

## 2021-08-19 NOTE — Plan of Care (Signed)

## 2021-08-19 NOTE — TOC Transition Note (Signed)
Transition of Care [facility]) - CM/SW Discharge Note   Patient Details  Name: Kristin Montes MRN: [id number] Date of Birth: 10-08-1975  Transition of Care [facility]) CM/SW Contact:  Lennart Pall, LCSW Phone Number: 08/19/2021, 10:52 AM   Clinical Narrative:    Met briefly with pt and confirming receipt of rw via [place]. Plan HEP.  No further TOC needs.   Final next level of care: Home/Self Care Barriers to Discharge: No Barriers Identified   Patient Goals and CMS Choice Patient states their goals for this hospitalization and ongoing recovery are:: return home      Discharge Placement                       Discharge Plan and Services                DME Arranged: Walker rolling DME Agency: [facility]                  Social Determinants of Health (SDOH) Interventions     Readmission Risk Interventions No flowsheet data found.

## 2021-08-20 ENCOUNTER — Telehealth: Payer: Self-pay

## 2021-08-20 NOTE — Telephone Encounter (Signed)
Transition Care Management Follow-up Telephone Call Date of discharge and from where: 08/19/2021,  [facility]  How have you been since you were released from the hospital? She said she is doing well, trying to do her exercises and control her pain.  Any questions or concerns? Yes - she would like a raised toilet seat because her toilet is too low and very difficult to get on/off.  Items Reviewed: Did the pt receive and understand the discharge instructions provided? Yes  Medications obtained and verified? Yes  - she said she has all medications and did not have any questions about her med regime.  Other? No  Any new allergies since your discharge? No  Dietary orders reviewed? Yes Do you have support at home? Yes   Home Care and Equipment/Supplies: Were home health services ordered? no If so, what is the name of the agency? N/a  Has the agency set up a time to come to the patient's home? not applicable Were any new equipment or medical supplies ordered?  Yes: RW What is the name of the medical supply agency? She received it in the hospital  Were you able to get the supplies/equipment? yes Do you have any questions related to the use of the equipment or supplies? No  Her surgical dressing is intact.   Functional Questionnaire: (I = Independent and D = Dependent) ADLs: using RW with ambulation. Has some assistance from family if needed.   Follow up appointments reviewed:  PCP Hospital f/u appt confirmed? Yes  Scheduled to see Dr Redmond Pulling on 09/07/2021.  [facility] f/u appt confirmed? Yes  Scheduled to see orthopedic surgeon in 2 weeks.   Are transportation arrangements needed?  She said if someone is not able to take her she uses her Medicaid transportation If their condition worsens, is the pt aware to call PCP or go to the Emergency Dept.? Yes Was the patient provided with contact information for the PCP's office or ED? Yes Was to pt encouraged to call back with  questions or concerns? Yes

## 2021-08-24 ENCOUNTER — Other Ambulatory Visit: Payer: Self-pay | Admitting: Family Medicine

## 2021-08-24 DIAGNOSIS — G8929 Other chronic pain: Secondary | ICD-10-CM

## 2021-08-24 DIAGNOSIS — M25552 Pain in left hip: Secondary | ICD-10-CM

## 2021-08-26 NOTE — Telephone Encounter (Signed)
Ill look into it 

## 2021-08-27 ENCOUNTER — Other Ambulatory Visit: Payer: Self-pay | Admitting: *Deleted

## 2021-08-27 MED ORDER — MISC. DEVICES MISC: 0 refills | Status: DC

## 2021-08-27 NOTE — Progress Notes (Unsigned)
[place]

## 2021-08-27 NOTE — Telephone Encounter (Signed)
Erskine Squibb, The only place I know to order from is [facility]. Is there another DME place? All  the other places are accepting new clients.

## 2021-08-27 NOTE — Discharge Summary (Signed)
Patient ID: Kristin Montes MRN: [id number] DOB/AGE: 07/29/1975 46 y.o.  Admit date: 08/18/2021 Discharge date: 08/19/2021  Admission Diagnoses:  Left hip osteoarthritis  Discharge Diagnoses:  Principal Problem:   S/P total left hip arthroplasty   Past Medical History:  Diagnosis Date   Arthritis    Complication of anesthesia    slow to wake up with nephrectomy   Depression    Headache    History of kidney stones    Pneumonia     Surgeries: Procedure(s): TOTAL HIP ARTHROPLASTY ANTERIOR APPROACH on 08/18/2021   Consultants:   Discharged Condition: Improved  Hospital Course: Kristin Montes is an 46 y.o. female who was admitted 08/18/2021 for operative treatment ofS/P total left hip arthroplasty. Patient has severe unremitting pain that affects sleep, daily activities, and work/hobbies. After pre-op clearance the patient was taken to the operating room on 08/18/2021 and underwent  Procedure(s): TOTAL HIP ARTHROPLASTY ANTERIOR APPROACH.    Patient was given perioperative antibiotics:  Anti-infectives (From admission, onward)    Start     Dose/Rate Route Frequency Ordered Stop   08/18/21 1500  ceFAZolin (ANCEF) IVPB 2g/100 mL premix        2 g 200 mL/hr over 30 Minutes Intravenous Every 6 hours 08/18/21 1253 08/18/21 2140   08/18/21 0645  ceFAZolin (ANCEF) IVPB 2g/100 mL premix        2 g 200 mL/hr over 30 Minutes Intravenous On call to O.R. 08/18/21 9798 08/18/21 9211        Patient was given sequential compression devices, early ambulation, and chemoprophylaxis to prevent DVT. Patient worked with PT and was meeting their goals regarding safe ambulation and transfers.  Patient benefited maximally from hospital stay and there were no complications.    Recent vital signs: No data found.   Recent laboratory studies: No results for input(s): WBC, HGB, HCT, PLT, NA, K, CL, CO2, BUN, CREATININE, GLUCOSE, INR, CALCIUM in the last 72 hours.  Invalid input(s): PT, 2   Discharge  Medications:   Allergies as of 08/19/2021   No Known Allergies      Medication List     STOP taking these medications    cyclobenzaprine 10 MG tablet Commonly known as: FLEXERIL   diclofenac 75 MG EC tablet Commonly known as: VOLTAREN   methocarbamol 500 MG tablet Commonly known as: ROBAXIN       TAKE these medications    acetaminophen 325 MG tablet Commonly known as: TYLENOL Take 3 tablets (975 mg total) by mouth every 6 (six) hours as needed (pain score 1-3 or temp > 100.5).   amitriptyline 50 MG tablet Commonly known as: ELAVIL Take 1 tablet (50 mg total) by mouth at bedtime.   aspirin 81 MG chewable tablet Chew 1 tablet (81 mg total) by mouth 2 (two) times daily for 28 days.   docusate sodium 100 MG capsule Commonly known as: COLACE Take 1 capsule (100 mg total) by mouth 2 (two) times daily.   oxyCODONE 5 MG immediate release tablet Commonly known as: Oxy IR/ROXICODONE Take 1 tablet (5 mg total) by mouth every 4 (four) hours as needed for severe pain.   polyethylene glycol 17 g packet Commonly known as: MIRALAX / GLYCOLAX Take 17 g by mouth daily as needed for mild constipation.   tiZANidine 4 MG tablet Commonly known as: ZANAFLEX Take 1 tablet (4 mg total) by mouth every 8 (eight) hours as needed for muscle spasms.  Discharge Care Instructions  (From admission, onward)           Start     Ordered   08/19/21 0000  Change dressing       Comments: Maintain surgical dressing until follow up in the clinic. If the edges start to pull up, may reinforce with tape. If the dressing is no longer working, may remove and cover with gauze and tape, but must keep the area dry and clean.  Call with any questions or concerns.   08/19/21 1006            Diagnostic Studies: DG Pelvis Portable  Result Date: 08/18/2021 CLINICAL DATA:  Total hip arthroplasty EXAM: PORTABLE PELVIS 1-2 VIEWS COMPARISON:  Radiograph 05/19/2021 FINDINGS: There is a  new left total hip arthroplasty in normal alignment without evidence of loosening or periprosthetic fracture. Expected soft tissue changes. Mild right hip osteoarthritis. IMPRESSION: No evidence of left hip arthroplasty complication. Electronically Signed   By: Caprice Renshaw M.D.   On: 08/18/2021 11:52   DG C-Arm 1-60 Min-No Report  Result Date: 08/18/2021 Fluoroscopy was utilized by the requesting physician.  No radiographic interpretation.   DG HIP UNILAT WITH PELVIS 1V LEFT  Result Date: 08/18/2021 CLINICAL DATA:  Left anterior hip replacement. EXAM: DG HIP (WITH OR WITHOUT PELVIS) 1V*L* COMPARISON:  None. FINDINGS: Fluoro time: 11 seconds. Fourteen C-arm fluoroscopic images were obtained intraoperatively and submitted for post operative interpretation. These images demonstrate changes associated with left hip replacement. Final provided images demonstrate femoral stem and acetabular components in place without prosthetic femoral head. Please see the performing provider's procedural report for further detail. IMPRESSION: Intraoperative fluoroscopy, detailed above. Electronically Signed   By: Feliberto Harts M.D.   On: 08/18/2021 11:02    Disposition: Discharge disposition: 01-Home or Self Care       Discharge Instructions     Call MD / Call 911   Complete by: As directed    If you experience chest pain or shortness of breath, CALL 911 and be transported to the hospital emergency room.  If you develope a fever above 101 F, pus (white drainage) or increased drainage or redness at the wound, or calf pain, call your surgeon's office.   Change dressing   Complete by: As directed    Maintain surgical dressing until follow up in the clinic. If the edges start to pull up, may reinforce with tape. If the dressing is no longer working, may remove and cover with gauze and tape, but must keep the area dry and clean.  Call with any questions or concerns.   Constipation Prevention   Complete by: As  directed    Drink plenty of fluids.  Prune juice may be helpful.  You may use a stool softener, such as Colace (over the counter) 100 mg twice a day.  Use MiraLax (over the counter) for constipation as needed.   Diet - low sodium heart healthy   Complete by: As directed    Increase activity slowly as tolerated   Complete by: As directed    Weight bearing as tolerated with assist device (walker, cane, etc) as directed, use it as long as suggested by your surgeon or therapist, typically at least 4-6 weeks.   Post-operative opioid taper instructions:   Complete by: As directed    POST-OPERATIVE OPIOID TAPER INSTRUCTIONS: It is important to wean off of your opioid medication as soon as possible. If you do not need pain medication after your surgery it  is ok to stop day one. Opioids include: Codeine, Hydrocodone(Norco, Vicodin), Oxycodone(Percocet, oxycontin) and hydromorphone amongst others.  Long term and even short term use of opiods can cause: Increased pain response Dependence Constipation Depression Respiratory depression And more.  Withdrawal symptoms can include Flu like symptoms Nausea, vomiting And more Techniques to manage these symptoms Hydrate well Eat regular healthy meals Stay active Use relaxation techniques(deep breathing, meditating, yoga) Do Not substitute Alcohol to help with tapering If you have been on opioids for less than two weeks and do not have pain than it is ok to stop all together.  Plan to wean off of opioids This plan should start within one week post op of your joint replacement. Maintain the same interval or time between taking each dose and first decrease the dose.  Cut the total daily intake of opioids by one tablet each day Next start to increase the time between doses. The last dose that should be eliminated is the evening dose.      TED hose   Complete by: As directed    Use stockings (TED hose) for 2 weeks on both leg(s).  You may remove  them at night for sleeping.        Follow-up Information     Durene Romans, MD. Schedule an appointment as soon as possible for a visit in 2 week(s).   Specialty: Orthopedic Surgery Contact information: [address] [place] [address] [phone]                  Signed: Cassandria Anger 08/27/2021, 9:59 AM

## 2021-09-01 NOTE — Telephone Encounter (Signed)
Thank you :)

## 2021-09-02 NOTE — Telephone Encounter (Signed)
noted 

## 2021-09-07 ENCOUNTER — Other Ambulatory Visit: Payer: Self-pay

## 2021-09-07 ENCOUNTER — Encounter: Payer: Self-pay | Admitting: Family Medicine

## 2021-09-07 ENCOUNTER — Ambulatory Visit: Payer: Medicaid Other | Admitting: Family Medicine

## 2021-09-07 VITALS — BP 136/89 | HR 96 | Temp 97.7°F | Resp 16 | Wt 168.2 lb

## 2021-09-07 DIAGNOSIS — F4321 Adjustment disorder with depressed mood: Secondary | ICD-10-CM

## 2021-09-07 DIAGNOSIS — Z96642 Presence of left artificial hip joint: Secondary | ICD-10-CM | POA: Diagnosis not present

## 2021-09-07 MED ORDER — DULOXETINE HCL 20 MG PO CPEP: 20.0000 mg | ORAL_CAPSULE | Freq: Every day | ORAL | 2 refills | Status: DC

## 2021-09-07 NOTE — Progress Notes (Signed)
Established Patient Office Visit  Subjective:  Patient ID: Kristin Montes, female    DOB: [birth year]  Age: 46 y.o. MRN: [id number]  CC:  Chief Complaint  Patient presents with   Follow-up    Surgery 08/18/2021    HPI Kristin Montes presents for follow up of hip surgery performed 3 weeks ago. Patient reports that she has been having severe pain. Patient reports emotional difficulties dealing with the severe pain and limitations immediately after the surgery.   Past Medical History:  Diagnosis Date   Arthritis    Complication of anesthesia    slow to wake up with nephrectomy   Depression    Headache    History of kidney stones    Pneumonia     Past Surgical History:  Procedure Laterality Date   HERNIA REPAIR     NEPHRECTOMY     TOTAL HIP ARTHROPLASTY Left 08/18/2021   Procedure: TOTAL HIP ARTHROPLASTY ANTERIOR APPROACH;  Surgeon: Durene Romans, MD;  Location: [facility];  Service: Orthopedics;  Laterality: Left;   TUBAL LIGATION      No family history on file.  Social History   Socioeconomic History   Marital status: Single    Spouse name: Not on file   Number of children: Not on file   Years of education: Not on file   Highest education level: Not on file  Occupational History   Not on file  Tobacco Use   Smoking status: Every Day    Packs/day: 0.25    Types: Cigarettes   Smokeless tobacco: Never   Tobacco comments:    6 cigs/day  Vaping Use   Vaping Use: Never used  Substance and Sexual Activity   Alcohol use: Not Currently   Drug use: Yes    Types: Marijuana    Comment: daily   Sexual activity: Not on file  Other Topics Concern   Not on file  Social History Narrative   Not on file   Social Determinants of Health   Financial Resource Strain: Not on file  Food Insecurity: Not on file  Transportation Needs: Not on file  Physical Activity: Not on file  Stress: Not on file  Social Connections: Not on file  Intimate Partner Violence: Not on file     ROS Review of Systems  Psychiatric/Behavioral:  Positive for sleep disturbance. Negative for self-injury and suicidal ideas. The patient is not nervous/anxious.   All other systems reviewed and are negative.  Objective:   Today's Vitals: BP 136/89    Pulse 96    Temp 97.7 F (36.5 C) (Oral)    Resp 16    Wt 168 lb 3.2 oz (76.3 kg)    SpO2 100%    BMI 28.87 kg/m   Physical Exam Vitals and nursing note reviewed.  Constitutional:      General: She is not in acute distress. Cardiovascular:     Rate and Rhythm: Normal rate and regular rhythm.  Pulmonary:     Effort: Pulmonary effort is normal.     Breath sounds: Normal breath sounds.  Abdominal:     Palpations: Abdomen is soft.     Tenderness: There is no abdominal tenderness.  Musculoskeletal:     Comments: Utilizing walker  Neurological:     General: No focal deficit present.     Mental Status: She is alert and oriented to person, place, and time.  Psychiatric:        Mood and Affect: Mood normal. Affect is tearful.  Assessment & Plan:   1. S/P total left hip arthroplasty Discussed management of pain.Cymbalta prescribed.  Management as per consultant.  2. Situational depression Cymbalta prescribed. Will monitor    Outpatient Encounter Medications as of 09/07/2021  Medication Sig   acetaminophen (TYLENOL) 325 MG tablet Take 3 tablets (975 mg total) by mouth every 6 (six) hours as needed (pain score 1-3 or temp > 100.5).   aspirin 81 MG chewable tablet Chew 1 tablet (81 mg total) by mouth 2 (two) times daily for 28 days.   docusate sodium (COLACE) 100 MG capsule Take 1 capsule (100 mg total) by mouth 2 (two) times daily.   DULoxetine (CYMBALTA) 20 MG capsule Take 1 capsule (20 mg total) by mouth daily.   Misc. Devices MISC ELEVATED TOILET SEAT FOR CHRONIC HIP PAIN  TO BE USE AS DIRECTED   oxyCODONE (OXY IR/ROXICODONE) 5 MG immediate release tablet Take 1 tablet (5 mg total) by mouth every 4 (four) hours as needed  for severe pain.   polyethylene glycol (MIRALAX / GLYCOLAX) 17 g packet Take 17 g by mouth daily as needed for mild constipation.   tiZANidine (ZANAFLEX) 4 MG tablet Take 1 tablet (4 mg total) by mouth every 8 (eight) hours as needed for muscle spasms.   amitriptyline (ELAVIL) 50 MG tablet Take 1 tablet (50 mg total) by mouth at bedtime.   No facility-administered encounter medications on file as of 09/07/2021.    Follow-up: No follow-ups on file.   Tommie Raymond, MD

## 2021-09-07 NOTE — Progress Notes (Signed)
Patient is her for follow-up post hip sx. Patient has no concerns / questions for provider  Patient did say that it has been very hard since sx

## 2021-10-05 ENCOUNTER — Ambulatory Visit: Payer: Medicaid Other | Admitting: Family Medicine

## 2021-10-05 ENCOUNTER — Other Ambulatory Visit: Payer: Self-pay

## 2021-10-05 ENCOUNTER — Encounter: Payer: Self-pay | Admitting: Family Medicine

## 2021-10-05 VITALS — BP 135/88 | HR 80 | Temp 98.0°F | Resp 16 | Ht 64.0 in | Wt 168.6 lb

## 2021-10-05 DIAGNOSIS — F4321 Adjustment disorder with depressed mood: Secondary | ICD-10-CM

## 2021-10-05 DIAGNOSIS — Z96642 Presence of left artificial hip joint: Secondary | ICD-10-CM

## 2021-10-05 MED ORDER — DULOXETINE HCL 30 MG PO CPEP: 30.0000 mg | ORAL_CAPSULE | Freq: Every day | ORAL | 3 refills | Status: DC

## 2021-10-06 ENCOUNTER — Encounter: Payer: Self-pay | Admitting: Family Medicine

## 2021-10-06 NOTE — Progress Notes (Signed)
? ?Established Patient Office Visit ? ?Subjective:  ?Patient ID: Kristin Montes, female    DOB: [birth year]  Age: 46 y.o. MRN: [id number] ? ?CC: No chief complaint on file. ? ? ?HPI ?[name] presents for follow up of left hip pain s/p replacement. She reports that she has not had any therapy. She continues with pain and is taking narcotic pain meds pretty regularly.  ? ?Past Medical History:  ?Diagnosis Date  ? Arthritis   ? Complication of anesthesia   ? slow to wake up with nephrectomy  ? Depression   ? Headache   ? History of kidney stones   ? Pneumonia   ? ? ?Past Surgical History:  ?Procedure Laterality Date  ? HERNIA REPAIR    ? NEPHRECTOMY    ? TOTAL HIP ARTHROPLASTY Left 08/18/2021  ? Procedure: TOTAL HIP ARTHROPLASTY ANTERIOR APPROACH;  Surgeon: Durene Romans, MD;  Location: [facility];  Service: Orthopedics;  Laterality: Left;  ? TUBAL LIGATION    ? ? ?No family history on file. ? ?Social History  ? ?Socioeconomic History  ? Marital status: Single  ?  Spouse name: Not on file  ? Number of children: Not on file  ? Years of education: Not on file  ? Highest education level: Not on file  ?Occupational History  ? Not on file  ?Tobacco Use  ? Smoking status: Every Day  ?  Packs/day: 0.25  ?  Types: Cigarettes  ? Smokeless tobacco: Never  ? Tobacco comments:  ?  6 cigs/day  ?Vaping Use  ? Vaping Use: Never used  ?Substance and Sexual Activity  ? Alcohol use: Not Currently  ? 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 Sexual activity: Not on file  ?Other Topics Concern  ? Not on file  ?Social History Narrative  ? Not on file  ? ?Social Determinants of Health  ? ?Financial Resource Strain: Not on file  ?Food Insecurity: Not on file  ?Transportation Needs: Not on file  ?Physical Activity: Not on file  ?Stress: Not on file  ?Social Connections: Not on file  ?Intimate Partner Violence: Not on file  ? ? ?ROS ?Review of Systems  ?Psychiatric/Behavioral:  Positive for sleep disturbance. Negative for  self-injury and suicidal ideas. The patient is not nervous/anxious.   ?All other systems reviewed and are negative. ? ?Objective:  ? ?Today's Vitals: BP 135/88   Pulse 80   Temp 98 ?F (36.7 ?C) (Oral)   Resp 16   Ht 5\' 4"  (1.626 m)   Wt 168 lb 9.6 oz (76.5 kg)   BMI 28.94 kg/m?  ? ?Physical Exam ?Vitals and nursing note reviewed.  ?Constitutional:   ?   General: She is not in acute distress. ?Cardiovascular:  ?   Rate and Rhythm: Normal rate and regular rhythm.  ?Pulmonary:  ?   Effort: Pulmonary effort is normal.  ?   Breath sounds: Normal breath sounds.  ?Abdominal:  ?   Palpations: Abdomen is soft.  ?   Tenderness: There is no abdominal tenderness.  ?Musculoskeletal:  ?   Comments: Utilizing cane  ?Neurological:  ?   General: No focal deficit present.  ?   Mental Status: She is alert and oriented to person, place, and time.  ?Psychiatric:     ?   Mood and Affect: Mood normal.     ?   Behavior: Behavior normal. Behavior is cooperative.  ? ? ?Assessment & Plan:  ? ?1.  S/P total left hip arthroplasty ?Referral to PT for further eval/mgt. Discussed narcotic weaning and use. Patient to utilize 5 tabs/week for extreme pain only. This med is prescribed by another provider.  ?- Ambulatory referral to Physical Therapy ? ?2. Situational depression ?Cymbalta increased from 20 mg to 30 mg daily. monitor ? ? ? ?Outpatient Encounter Medications as of 10/05/2021  ?Medication Sig  ? acetaminophen (TYLENOL) 325 MG tablet Take 3 tablets (975 mg total) by mouth every 6 (six) hours as needed (pain score 1-3 or temp > 100.5).  ? docusate sodium (COLACE) 100 MG capsule Take 1 capsule (100 mg total) by mouth 2 (two) times daily.  ? DULoxetine (CYMBALTA) 30 MG capsule Take 1 capsule (30 mg total) by mouth daily.  ? Misc. Devices MISC ELEVATED TOILET SEAT FOR CHRONIC HIP PAIN  TO BE USE AS DIRECTED  ? polyethylene glycol (MIRALAX / GLYCOLAX) 17 g packet Take 17 g by mouth daily as needed for mild constipation.  ? tiZANidine  (ZANAFLEX) 4 MG tablet Take 1 tablet (4 mg total) by mouth every 8 (eight) hours as needed for muscle spasms.  ? [DISCONTINUED] DULoxetine (CYMBALTA) 20 MG capsule Take 1 capsule (20 mg total) by mouth daily.  ? cyclobenzaprine (FLEXERIL) 10 MG tablet cyclobenzaprine 10 mg tablet ? Take 1 tablet 3 times a day by oral route.  ? pregabalin (LYRICA) 75 MG capsule Take 75 mg by mouth at bedtime as needed.  ? [DISCONTINUED] amitriptyline (ELAVIL) 50 MG tablet Take 1 tablet (50 mg total) by mouth at bedtime.  ? [DISCONTINUED] HYDROcodone-acetaminophen (NORCO/VICODIN) 5-325 MG tablet hydrocodone 5 mg-acetaminophen 325 mg tablet ? Take 1 tablet twice a day by oral route as needed.  ? [DISCONTINUED] oxyCODONE (OXY IR/ROXICODONE) 5 MG immediate release tablet Take 1 tablet (5 mg total) by mouth every 4 (four) hours as needed for severe pain. (Patient not taking: Reported on 10/05/2021)  ? ?No facility-administered encounter medications on file as of 10/05/2021.  ? ? ?Follow-up: Return in about 4 weeks (around 11/02/2021) for follow up.  ? ?Tommie Raymond, MD ? ?

## 2021-10-12 NOTE — Therapy (Signed)
?OUTPATIENT PHYSICAL THERAPY LOWER EXTREMITY EVALUATION ? ? ?Patient Name: Kristin Montes ?MRN: [id number] ?DOB:1976/05/13, 46 y.o., female ?Today's Date: 10/13/2021 ? ? PT End of Session - 10/13/21 1345   ? ? Visit Number 1   ? Number of Visits 16   ? Date for PT Re-Evaluation 02/02/22   ? Authorization Type [employer] MCD No vaso, ionto, estim or traction   ? PT Start Time 1330   ? PT Stop Time 1420   ? PT Time Calculation (min) 50 min   ? Activity Tolerance Patient limited by pain;Other (comment)   weakness  ? Behavior During Therapy [facility] for tasks assessed/performed   ? ?  ?  ? ?  ? ? ?Past Medical History:  ?Diagnosis Date  ? Arthritis   ? Complication of anesthesia   ? slow to wake up with nephrectomy  ? Depression   ? Headache   ? History of kidney stones   ? Pneumonia   ? ?Past Surgical History:  ?Procedure Laterality Date  ? HERNIA REPAIR    ? NEPHRECTOMY    ? TOTAL HIP ARTHROPLASTY Left 08/18/2021  ? Procedure: TOTAL HIP ARTHROPLASTY ANTERIOR APPROACH;  Surgeon: Durene Romans, MD;  Location: [facility];  Service: Orthopedics;  Laterality: Left;  ? TUBAL LIGATION    ? ?Patient Active Problem List  ? Diagnosis Date Noted  ? S/P total left hip arthroplasty 08/18/2021  ? DDD (degenerative disc disease), cervical 07/28/2021  ? Osteoarthritis of hip 06/13/2021  ? Chronic left hip pain 05/19/2021  ? Foraminal stenosis of cervical region 01/23/2021  ? Trichomonal infection 12/17/2020  ? Herniated cervical disc 09/28/2020  ? 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 ? ?PCP: Georganna Skeans, MD ? ?REFERRING PROVIDER: Georganna Skeans, MD ? ?REFERRING DIAG: I95.188 (ICD-10-CM) - S/P total left hip arthroplasty ? ?THERAPY DIAG:  ?Pain in left hip ? ?Muscle weakness (generalized) ? ?Difficulty in walking, not elsewhere classified ? ?ONSET DATE: THA August 18, 2021 ? ?SUBJECTIVE:  ? ?SUBJECTIVE STATEMENT: ?I have trouble walking without pain.  I just started using the cane and I was on a walker.  It is hard to stand and walk and sit for  longer than 10-15 min. My surgery or L anterior THA. was on Feberuary 7, 2023 with DR Charlann Boxer.  My knee feels weak and I feel like I am not stable. ?  ?PERTINENT HISTORY: ?S/p nephrectomy 2003, Trichomonal infection. Foraminal stenosis of cervical region( receiving cortisone injections, chronic Left hip pain, OA of both hips, DDD, herniated Cervical disc ? ?hx including but not limited to arthritis, nephrectomy, depression ?PAIN:  ?Are you having pain? Yes: NPRS scale: 8/10 pain  with movement and 6/10 ?Pain location: left hip and in buttocks and in my inguinal line, sometimes radiates into my knee ?Pain description: aching pain ?Aggravating factors: standing for longer than 10 minutes, sitting for longer than 20 minutes. Walking for longer than 15 min  , putting on sock and putting on pants, unable to do household chores, getting in the shower and picking up leg over tub ?Relieving factors: propping my feet up, taking muscle relaxers and pain medication, ice ? ?PRECAUTIONS: Anterior hip ? ?WEIGHT BEARING RESTRICTIONS Yes WBAT on L ? ?FALLS:  ?Has patient fallen in last 6 months? Yes. Number of falls 3  fell before surgery but now feel like I am tripping up sometimes because of my L knee and my ankle ? ?LIVING ENVIRONMENT: ?Lives with: lives with their son ?Lives in: House/apartment ?Stairs: Yes: External:  2 steps; none ?Has following equipment at home: Single point cane, Walker - 2 wheeled, and raised toilet seat ? ?OCCUPATION: not working right now .  Working on disability application ? ?PLOF: Independent ? ?PATIENT GOALS My goals are to walk without limping and lay and turn on bed without pain.  Stand for longer so I can shop,  I want to drive ? ? ?OBJECTIVE:  ? 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 Dedicated CT or MRI ?could confirm. ?08-18-21 IMPRESSION: POST OA dx ?No evidence of left hip arthroplasty complication. ? ?PATIENT SURVEYS:  ?LEFS  10/80 ? ?COGNITION: ? Overall cognitive status: Within functional limits for tasks assessed   ?  ?SENSATION: ?Light touch: Impaired  numbness over incision ? ?MUSCLE LENGTH: ?Hamstrings: Right 60 deg; Left 50 degmust have PT assist on L ?Thomas test: NT post surgery ? ?POSTURE:  ?Left hip level lower than R ? ?PALPATION: ?Well healed anterior hip scar for THA on L, TTP over Left gluteals down ITB and piriformis ? ?LE ROM: ? ?Active ROM Right ?10/13/2021 Left ?10/13/2021  ?Hip flexion 90 60  ?Hip extension 30 5  ?Hip abduction 38 20  ?Hip adduction    ?Hip internal rotation    ?Hip external rotation    ?Knee flexion 135/140 137141  ?Knee extension 0 0  ?Ankle dorsiflexion    ?Ankle plantarflexion    ?Ankle inversion    ?Ankle eversion    ? (Blank rows = not tested) ? ?LE MMT:    unable to perform SLR with Left leg 10-13-21 ? ?MMT Right ?10/13/2021 Left ?10/13/2021  ?Hip flexion 4- 3-  ?Hip extension 4- 3-  ?Hip abduction 4- 2+  ?Hip adduction    ?Hip internal rotation 4- 3-  ?Hip external rotation 4- 3-  ?Knee flexion 4+ 4  ?Knee extension 4 4  ?Ankle dorsiflexion    ?Ankle plantarflexion    ?Ankle inversion    ?Ankle eversion    ? (Blank rows = not tested) ? ?LOWER EXTREMITY SPECIAL TESTS:  ?NT due to post L anterior THA ? ?FUNCTIONAL TESTS:  ?5 times sit to stand: 32 sec  pain and must use hands ?6 minute walk test: TBA ? ?GAIT: ?Distance walked: 120 feet ?Assistive device utilized: Single point cane ?Level of assistance: Complete Independence ?Comments: Pt with moderate difficulty and antalgic gait due to Left LE weakness and pain at 8/10.  Pt did negotiate steps with step to gait and use of cane and handrail ? ? ? ?TODAY'S TREATMENT: ?4-4-23Issued HEP and EVAL ? ? ?PATIENT EDUCATION:  ?Education details: POC, explanation of findings , HEP issued , LEFS explanation 10/80 ?Person educated: Patient ?Education method: Explanation, Demonstration, Tactile cues, Verbal cues, and Handouts ?Education comprehension: verbalized  understanding, returned demonstration, verbal cues required, tactile cues required, and needs further education ? ? ?HOME EXERCISE PROGRAM: ?Access Code: [email or web address] ?URL: [email or web address] ?Date: 10/13/2021 ?Prepared by: Garen Lah ? ?Exercises ?- sit to stand with Hands on knees or on sink with chair behind  - 1 x daily - 7 x weekly - 3 sets - 10 reps ?- short arc quad with gluteal tightning  - 1 x daily - 7 x weekly - 3 sets - 10 reps ?- Supine Heel Slide with Strap  - 1 x daily - 7 x weekly - 3 sets - 10 reps ?- Supine Ankle Pumps  - 1 x daily - 7 x weekly - 3 sets - 10 reps ?- Supine Hip Abduction  -  1 x daily - 7 x weekly - 3 sets - 10 reps ?- Supine Single Bent Knee Fallout  - 1 x daily - 7 x weekly - 3 sets - 10 reps ?- Long Arc Quad  - 1 x daily - 7 x weekly - 3 sets - 10 reps ? ?ASSESSMENT: ? ?CLINICAL IMPRESSION: ?Patient is a 46 y.o. female with  who was seen today for physical therapy evaluation and treatment for L THA for OA and limitation in flexion, IR before surgery.  Pt has not had PT post surgery and has been inconsistent with exercise due to 8/10 pain. Pt is unable to stand, walk or sit for longer than 10-15 minutes Pt. Has difficulty with walking and negotiating steps due to profound weakness of Left hip and LE. Pt will benefit from skilled PT to address impairments to return to being able to address self care and be more independent in home and community.  ? ? ?OBJECTIVE IMPAIRMENTS decreased activity tolerance, decreased knowledge of use of DME, decreased mobility, difficulty walking, decreased ROM, decreased strength, postural dysfunction, obesity, and pain.  ? ?ACTIVITY LIMITATIONS cleaning, driving, meal prep, laundry, and negotiating steps and walking .  ? ?PERSONAL FACTORS S/p nephrectomy 2003, Trichomonal infection. Foraminal stenosis of cervical region( receiving cortisone injections, chronic Left hip pain, OA of both hips, DDD, herniated Cervical disc are also  affecting patient's functional outcome.  ? ? ?REHAB POTENTIAL: Good ? ?CLINICAL DECISION MAKING: Evolving/moderate complexity ? ?EVALUATION COMPLEXITY: Moderate ? ? ?GOALS: ?Goals reviewed with patient? Yes ? ?SHORT TERM GOALS: [employer]

## 2021-10-13 ENCOUNTER — Ambulatory Visit: Payer: Medicaid Other | Attending: Family Medicine | Admitting: Physical Therapy

## 2021-10-13 ENCOUNTER — Encounter: Payer: Self-pay | Admitting: Physical Therapy

## 2021-10-13 DIAGNOSIS — M6281 Muscle weakness (generalized): Secondary | ICD-10-CM | POA: Diagnosis present

## 2021-10-13 DIAGNOSIS — M542 Cervicalgia: Secondary | ICD-10-CM | POA: Diagnosis present

## 2021-10-13 DIAGNOSIS — R262 Difficulty in walking, not elsewhere classified: Secondary | ICD-10-CM | POA: Insufficient documentation

## 2021-10-13 DIAGNOSIS — Z96642 Presence of left artificial hip joint: Secondary | ICD-10-CM | POA: Diagnosis not present

## 2021-10-13 DIAGNOSIS — R252 Cramp and spasm: Secondary | ICD-10-CM | POA: Insufficient documentation

## 2021-10-13 DIAGNOSIS — M25552 Pain in left hip: Secondary | ICD-10-CM | POA: Insufficient documentation

## 2021-10-13 NOTE — Patient Instructions (Addendum)
Access Code: [email or web address] ?URL: [email or web address] ?Date: 10/13/2021 ?Prepared by: Garen Lah ? ?Exercises ?- sit to stand with Hands on knees or on sink with chair behind  - 1 x daily - 7 x weekly - 3 sets - 10 reps ?- short arc quad with gluteal tightning  - 1 x daily - 7 x weekly - 3 sets - 10 reps ?- Supine Heel Slide with Strap  - 1 x daily - 7 x weekly - 3 sets - 10 reps ?- Supine Ankle Pumps  - 1 x daily - 7 x weekly - 3 sets - 10 reps ?- Supine Hip Abduction  - 1 x daily - 7 x weekly - 3 sets - 10 reps ?- Supine Single Bent Knee Fallout  - 1 x daily - 7 x weekly - 3 sets - 10 reps ?- Long Arc Quad  - 1 x daily - 7 x weekly - 3 sets - 10 reps ? ?Aquatic Therapy at [facility]-  What to Expect! ? ?Where:  ? ?[facility] ?[address] ?[place], [place] [address] ?Rehab phone [phone] ? ?NOTE:  You will receive an automated phone message reminding you of your appt and it will say the appointment is at the [address] [facility] clinic. ? ?        ?How to Prepare: ?Please make sure you drink 8 ounces of water about one hour prior to your pool session ?A caregiver may attend if needed with the patient to help assist as needed. A caregiver can sit in the pool room on chair. ?Please arrive IN YOUR SUIT and 15 minutes prior to your appointment - this helps to avoid delays in starting your session. ?Please make sure to attend to any toileting needs prior to entering the pool ?Locker rooms for changing are provided.   There is direct access to the pool deck form the locker room.  You can lock your belongings in a locker with lock provided. ?Once on the pool deck your therapist will ask if you have signed the Patient  Consent and Assignment of Benefits form before beginning treatment ?Your therapist may take your blood pressure prior to, during and after your session if indicated ?We usually try and create a home exercise program based on  activities we do in the pool.  Please be thinking about who might be able to assist you in the pool should you need to participate in an aquatic home exercise program at the time of discharge if you need assistance.  Some patients do not want to or do not have the ability to participate in an aquatic home program - this is not a barrier in any way to you participating in aquatic therapy as part of your current therapy plan! ?After Discharge from PT, you can continue using home program at  the [facility], there is a drop-in fee for $5 ($45 a month)or for 60 years  or older $4.00 ($40 a month for seniors ) or any local [facility] pool.  Memberships for purchase are available for gym/pool at [facility] ? ?IT IS VERY IMPORTANT THAT YOUR LAST VISIT BE IN THE CLINIC AT [facility] AFTER YOUR LAST AQUATIC VISIT.  PLEASE MAKE SURE THAT YOU HAVE A [address]  APPOINTMENT SCHEDULED. ? ? ?About the pool: ?Pool is located approximately 500 FT from the entrance of the building.  ?Please bring a support person if you need assistance traveling this  distance.  ? ?Your therapist will assist you in entering the water; there are two ways to     ?      enter: stairs with railings, and a mechanical lift. Your therapist will determine the most appropriate way for you. ? ?Water temperature is usually between 88-90 degrees ? ?There may be up to 2 other swimmers in the pool at the same time ? ?The pool deck is tile, please wear shoes with good traction if you prefer not to be barefoot.  ? ? ?Contact Info:  ?For appointment scheduling and cancellations:         Please call the [facility]  PH:[phone]              Aquatic Therapy  ?[facility]       All sessions are 45 minutes ? ? ? ? ?        ? ?                ?                      ?Garen Lah, PT, ATRIC ?Certified Exercise Expert for the Aging Adult  ?10/13/21 2:14 PM ?Phone: [phone] ?Fax:  [phone]  ?

## 2021-10-19 NOTE — Therapy (Signed)
?OUTPATIENT PHYSICAL THERAPY TREATMENT NOTE ? ? ?Patient Name: Kristin Montes ?MRN: [id number] ?DOB:[birth year], 46 y.o., female ?Today's Date: 10/20/2021 ? ?PCP: Georganna Skeans, MD ?REFERRING PROVIDER: Georganna Skeans, MD ? ?END OF SESSION:  ? PT End of Session - 10/20/21 1509   ? ? Visit Number 2   ? Number of Visits 16   ? Date for PT Re-Evaluation 02/02/22   ? Authorization Type [employer] MCD No vaso, ionto, estim or traction   ? PT Start Time 1503   ? PT Stop Time 1545   ? PT Time Calculation (min) 42 min   ? Activity Tolerance Patient limited by pain   ? Behavior During Therapy [facility] for tasks assessed/performed   ? ?  ?  ? ?  ? ? ?Past Medical History:  ?Diagnosis Date  ? Arthritis   ? Complication of anesthesia   ? slow to wake up with nephrectomy  ? Depression   ? Headache   ? History of kidney stones   ? Pneumonia   ? ?Past Surgical History:  ?Procedure Laterality Date  ? HERNIA REPAIR    ? NEPHRECTOMY    ? TOTAL HIP ARTHROPLASTY Left 08/18/2021  ? Procedure: TOTAL HIP ARTHROPLASTY ANTERIOR APPROACH;  Surgeon: Durene Romans, MD;  Location: [facility];  Service: Orthopedics;  Laterality: Left;  ? TUBAL LIGATION    ? ?Patient Active Problem List  ? Diagnosis Date Noted  ? S/P total left hip arthroplasty 08/18/2021  ? DDD (degenerative disc disease), cervical 07/28/2021  ? Osteoarthritis of hip 06/13/2021  ? Chronic left hip pain 05/19/2021  ? Foraminal stenosis of cervical region 01/23/2021  ? Trichomonal infection 12/17/2020  ? Herniated cervical disc 09/28/2020  ? 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 ? ?REFERRING DIAG: A35.573 (ICD-10-CM) - S/P total left hip arthroplasty ? ?THERAPY DIAG:  ?Pain in left hip ? ?Muscle weakness (generalized) ? ?Difficulty in walking, not elsewhere classified ? ?Cervicalgia ? ?Cramp and spasm ? ?PERTINENT HISTORY: S/p nephrectomy 2003, Trichomonal infection. Foraminal stenosis of cervical region( receiving cortisone injections, chronic Left hip pain, OA of both hips, DDD, herniated Cervical  disc ? ?PRECAUTIONS: Anterior Hip [place] ?WEIGHT BEARING RESTRICTIONS Yes WBAT on L ? ?FALLS:  ?Has patient fallen in last 6 months? Yes. Number of falls 3  fell before surgery but now feel like I am tripping up sometimes because of my L knee and my ankle ? ?SUBJECTIVE: I have been doing my exercises for a 3rd day but I was sore.  Sat and Sun I almost fell on Friday so I rested on Sat and sun ? ?PAIN:  ?Are you having pain? Are you having pain? Yes: NPRS scale: 7/10 pain  with movement and 6/10 ?Pain location: left hip and in buttocks and in my inguinal line, sometimes radiates into my knee ?Pain description: aching pain ?Aggravating factors: standing for longer than 10 minutes, sitting for longer than 20 minutes. Walking for longer than 15 min  , putting on sock and putting on pants, unable to do household chores, getting in the shower and picking up leg over tub ?Relieving factors: propping my feet up, taking muscle relaxers and pain medication, ice ? ? ? ? ?  ?OBJECTIVE:  ?  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 Dedicated CT or MRI ?could confirm. ?08-18-21 IMPRESSION: POST OA dx ?No evidence of left hip arthroplasty complication. ?  ?PATIENT SURVEYS:  ?LEFS 10/80 ?  ?COGNITION: ?          Overall  cognitive status: Within functional limits for tasks assessed               ?           ?SENSATION: ?Light touch: Impaired  numbness over incision ?  ?MUSCLE LENGTH: ?Hamstrings: Right 60 deg; Left 50 degmust have PT assist on L ?Thomas test: NT post surgery ?  ?POSTURE:  ?Left hip level lower than R ?  ?PALPATION: ?Well healed anterior hip scar for THA on L, TTP over Left gluteals down ITB and piriformis ?  ?LE ROM: ?  ?Active ROM Right ?10/13/2021 Left ?10/13/2021  ?Hip flexion 90 60  ?Hip extension 30 5  ?Hip abduction 38 20  ?Hip adduction      ?Hip internal rotation      ?Hip external rotation      ?Knee flexion 135/140 137141  ?Knee extension 0 0  ?Ankle  dorsiflexion      ?Ankle plantarflexion      ?Ankle inversion      ?Ankle eversion      ? (Blank rows = not tested) ?  ?LE MMT:    unable to perform SLR with Left leg 10-13-21 ?  ?MMT Right ?10/13/2021 Left ?10/13/2021  ?Hip flexion 4- 3-  ?Hip extension 4- 3-  ?Hip abduction 4- 2+  ?Hip adduction      ?Hip internal rotation 4- 3-  ?Hip external rotation 4- 3-  ?Knee flexion 4+ 4  ?Knee extension 4 4  ?Ankle dorsiflexion      ?Ankle plantarflexion      ?Ankle inversion      ?Ankle eversion      ? (Blank rows = not tested) ?  ?LOWER EXTREMITY SPECIAL TESTS:  ?NT due to post L anterior THA ?  ?FUNCTIONAL TESTS:  ?5 times sit to stand: 32 sec  pain and must use hands ?6 minute walk test: TBA ?  ?GAIT: ?Distance walked: 120 feet ?Assistive device utilized: Single point cane ?Level of assistance: Complete Independence ?Comments: Pt with moderate difficulty and antalgic gait due to Left LE weakness and pain at 8/10.  Pt did negotiate steps with step to gait and use of cane and handrail ?  ?  ?  ?TODAY'S TREATMENT: ? ? ?[facility] Adult PT Treatment:                                                DATE: 10-20-21 ?Therapeutic Exercise: ?Heel raise bil 2 x 10 ?Heel raise on R x 10 ?Heel raise on L unable initially and then slowly to 9 ?Standing Hip abduction with UE support 2 x 10 on R and L ?Standing Hip extension with UE support 2 x 10 R and L ?Standing marching 1 x 10 R and L ?Sit to stand with VC for even wt distribution 2 x 10 ?LAQ with 2 x 10 R and L ?SAQ 1 x 10 R and L ?Supine heel slides 2 x 10 Left and R ?Supine abduction 15 x on L ?Supine bent knee fall out 2 x 10 on R and L ?Sidelyng R for left hip abduction 2 x 10 with VC and TC from PT ?GAIT- pt gait trained for 200 feet with use of solid cane with  proper adjustment ?Pt with antalgic gait and trunk lean to R, improved at end of session ? ? ? ?4-4-23Issued  HEP and EVAL ?  ?  ?PATIENT EDUCATION:  ?Education details: POC, explanation of findings , HEP issued , LEFS explanation  10/80, HEP given and updated ?Person educated: Patient ?Education method: Explanation, Demonstration, Tactile cues, Verbal cues, and Handouts ?Education comprehension: verbalized understanding, returned demonstration, verbal cues required, tactile cues required, and needs further education ?  ?  ?HOME EXERCISE PROGRAM: ?Access Code: [email or web address] ?URL: [email or web address] ?Date: 10/13/2021 ?Prepared by: Garen Lah ?  ?Exercises ?- sit to stand with Hands on knees or on sink with chair behind  - 1 x daily - 7 x weekly - 3 sets - 10 reps ?- short arc quad with gluteal tightning  - 1 x daily - 7 x weekly - 3 sets - 10 reps ?- Supine Heel Slide with Strap  - 1 x daily - 7 x weekly - 3 sets - 10 reps ?- Supine Ankle Pumps  - 1 x daily - 7 x weekly - 3 sets - 10 reps ?- Supine Hip Abduction  - 1 x daily - 7 x weekly - 3 sets - 10 reps ?- Supine Single Bent Knee Fallout  - 1 x daily - 7 x weekly - 3 sets - 10 reps ?- Long Arc Quad  - 1 x daily - 7 x weekly - 3 sets - 10 reps ? ?Added 10-20-21 ? ?- Sidelying Hip Abduction  - 1 x daily - 7 x weekly - 3 sets - 10 reps ?- Supine Single Bent Knee Fallout  - 1 x daily - 7 x weekly - 3 sets - 10 reps ?- Standing Hip Abduction  - 1 x daily - 7 x weekly - 3 sets - 10 reps ?- Standing Hip Flexion March  - 1 x daily - 7 x weekly - 3 sets - 10 reps ?- Standing Hip Extension with Chair  - 1 x daily - 7 x weekly - 3 sets - 10 reps ?  ?ASSESSMENT: ?  ?CLINICAL IMPRESSION: ?Pt enters clinic with 6/10 pain and without cane which was left in car because it collapses.  Pt was given cane to use in clinic and told to obtain a sturdy cane for safe ambulation.  Pt did have a stumble on Friday tripping over collapsible cane.   Pt gait trained with adjustment of cane to improve trunk lean to Right.  Pt was instructed to obtain a safe cane to prevent falls. Pt with improved posture and gait at end of session ?Pt leans trunk to R to un weight left hip in gait and very antalgic gait.  Pt is tentative about movement and fearful of pain.  Pt has demonstrated profound weakness in Left hip but able to move but with very weak glut med.  ? ?Eval- Patient is a 46 y.o. female with  who was seen today

## 2021-10-20 ENCOUNTER — Encounter: Payer: Self-pay | Admitting: Physical Therapy

## 2021-10-20 ENCOUNTER — Ambulatory Visit: Payer: Medicaid Other | Admitting: Physical Therapy

## 2021-10-20 DIAGNOSIS — R262 Difficulty in walking, not elsewhere classified: Secondary | ICD-10-CM

## 2021-10-20 DIAGNOSIS — M542 Cervicalgia: Secondary | ICD-10-CM

## 2021-10-20 DIAGNOSIS — M25552 Pain in left hip: Secondary | ICD-10-CM

## 2021-10-20 DIAGNOSIS — R252 Cramp and spasm: Secondary | ICD-10-CM

## 2021-10-20 DIAGNOSIS — M6281 Muscle weakness (generalized): Secondary | ICD-10-CM

## 2021-10-20 NOTE — Patient Instructions (Signed)
Access Code: [email or web address] ?URL: [email or web address] ?Date: 10/20/2021 ?Prepared by: Garen Lah ? ?Exercises ?- sit to stand with Hands on knees or on sink with chair behind  - 1 x daily - 7 x weekly - 3 sets - 10 reps ?- short arc quad with gluteal tightning  - 1 x daily - 7 x weekly - 3 sets - 10 reps ?- Supine Heel Slide with Strap  - 1 x daily - 7 x weekly - 3 sets - 10 reps ?- Supine Ankle Pumps  - 1 x daily - 7 x weekly - 3 sets - 10 reps ?- Supine Hip Abduction  - 1 x daily - 7 x weekly - 3 sets - 10 reps ?- Supine Single Bent Knee Fallout  - 1 x daily - 7 x weekly - 3 sets - 10 reps ?- Long Arc Quad  - 1 x daily - 7 x weekly - 3 sets - 10 reps ? ? ?Added 10-20-21 ?- Sidelying Hip Abduction  - 1 x daily - 7 x weekly - 3 sets - 10 reps ?- Supine Single Bent Knee Fallout  - 1 x daily - 7 x weekly - 3 sets - 10 reps ?- Standing Hip Abduction  - 1 x daily - 7 x weekly - 3 sets - 10 reps ?- Standing Hip Flexion March  - 1 x daily - 7 x weekly - 3 sets - 10 reps ?- Standing Hip Extension with Chair  - 1 x daily - 7 x weekly - 3 sets - 10 reps ? ?Garen Lah, PT, ATRIC ?Certified Exercise Expert for the Aging Adult  ?10/20/21 3:26 PM ?Phone: [phone] ?Fax: [phone]  ?

## 2021-10-21 NOTE — Therapy (Signed)
?OUTPATIENT PHYSICAL THERAPY TREATMENT NOTE ? ? ?Patient Name: Kristin Montes ?MRN: [id number] ?DOB:[birth year], 46 y.o., female ?Today's Date: 10/22/2021 ? ?PCP: Georganna Skeans, MD ?REFERRING PROVIDER: Georganna Skeans, MD ? ?END OF SESSION:  ? PT End of Session - 10/22/21 0856   ? ? Visit Number 3   ? Number of Visits 16   ? Date for PT Re-Evaluation 02/02/22   ? Authorization Type [employer] No vaso, ionto, estim or traction   ? PT Start Time [phone]   ? PT Stop Time 0930   ? PT Time Calculation (min) 33 min   ? Activity Tolerance Patient limited by pain   ? Behavior During Therapy [facility] for tasks assessed/performed   ? ?  ?  ? ?  ? ? ? ?Past Medical History:  ?Diagnosis Date  ? Arthritis   ? Complication of anesthesia   ? slow to wake up with nephrectomy  ? Depression   ? Headache   ? History of kidney stones   ? Pneumonia   ? ?Past Surgical History:  ?Procedure Laterality Date  ? HERNIA REPAIR    ? NEPHRECTOMY    ? TOTAL HIP ARTHROPLASTY Left 08/18/2021  ? Procedure: TOTAL HIP ARTHROPLASTY ANTERIOR APPROACH;  Surgeon: Durene Romans, MD;  Location: [facility];  Service: Orthopedics;  Laterality: Left;  ? TUBAL LIGATION    ? ?Patient Active Problem List  ? Diagnosis Date Noted  ? S/P total left hip arthroplasty 08/18/2021  ? DDD (degenerative disc disease), cervical 07/28/2021  ? Osteoarthritis of hip 06/13/2021  ? Chronic left hip pain 05/19/2021  ? Foraminal stenosis of cervical region 01/23/2021  ? Trichomonal infection 12/17/2020  ? Herniated cervical disc 09/28/2020  ? 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 ? ?REFERRING DIAG: Y81.448 (ICD-10-CM) - S/P total left hip arthroplasty ? ?THERAPY DIAG:  ?Pain in left hip ? ?Muscle weakness (generalized) ? ?Difficulty in walking, not elsewhere classified ? ?Cervicalgia ? ?Cramp and spasm ? ?PERTINENT HISTORY: S/p nephrectomy 2003, Trichomonal infection. Foraminal stenosis of cervical region( receiving cortisone injections, chronic Left hip pain, OA of both hips, DDD, herniated Cervical  disc ? ?PRECAUTIONS: Anterior Hip Precautians ?WEIGHT BEARING RESTRICTIONS Yes WBAT on L ? ?FALLS:  ?Has patient fallen in last 6 months? Yes. Number of falls 3  fell before surgery but now feel like I am tripping up sometimes because of my L knee and my ankle ? ?SUBJECTIVE: I haven/t gotten my new cane yes. I am using my collapsible one that I fell with.  I do use my walker at home.  ? ? ? ?PAIN: I have pain any time. Lying down or walking but most of the time walking ?Are you having pain? Are you having pain? Yes: NPRS scale: 6/10 pain  10-22-21 ?Pain location: left hip and in buttocks and in my inguinal line, sometimes radiates into my knee ?Pain description: aching pain ?Aggravating factors: standing for longer than 10 minutes, sitting for longer than 20 minutes. Walking for longer than 15 min  , putting on sock and putting on pants, unable to do household chores, getting in the shower and picking up leg over tub ?Relieving factors: propping my feet up, taking muscle relaxers and pain medication, ice ? ? ? ? ?  ?OBJECTIVE:  ?  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 Dedicated CT or MRI ?could confirm. ?08-18-21 IMPRESSION: POST OA dx ?No evidence of left hip arthroplasty complication. ?  ?PATIENT SURVEYS:  ?LEFS 10/80 ?  ?COGNITION: ?  Overall cognitive status: Within functional limits for tasks assessed               ?           ?SENSATION: ?Light touch: Impaired  numbness over incision ?  ?MUSCLE LENGTH: ?Hamstrings: Right 60 deg; Left 50 degmust have PT assist on L ?Thomas test: NT post surgery ?  ?POSTURE:  ?Left hip level lower than R ?  ?PALPATION: ?Well healed anterior hip scar for THA on L, TTP over Left gluteals down ITB and piriformis ?  ?LE ROM: ?  ?Active ROM Right ?10/13/2021 Left ?10/13/2021  ?Hip flexion 90 60  ?Hip extension 30 5  ?Hip abduction 38 20  ?Hip adduction      ?Hip internal rotation      ?Hip external rotation      ?Knee flexion  135/140 137141  ?Knee extension 0 0  ?Ankle dorsiflexion      ?Ankle plantarflexion      ?Ankle inversion      ?Ankle eversion      ? (Blank rows = not tested) ?  ?LE MMT:    unable to perform SLR with Left leg 10-13-21 ?  ?MMT Right ?10/13/2021 Left ?10/13/2021  ?Hip flexion 4- 3-  ?Hip extension 4- 3-  ?Hip abduction 4- 2+  ?Hip adduction      ?Hip internal rotation 4- 3-  ?Hip external rotation 4- 3-  ?Knee flexion 4+ 4  ?Knee extension 4 4  ?Ankle dorsiflexion      ?Ankle plantarflexion      ?Ankle inversion      ?Ankle eversion      ? (Blank rows = not tested) ?  ?LOWER EXTREMITY SPECIAL TESTS:  ?NT due to post L anterior THA ?  ?FUNCTIONAL TESTS:  ?5 times sit to stand: 32 sec  pain and must use hands ?6 minute walk test: TBA ?  ?GAIT: ?Distance walked: 120 feet ?Assistive device utilized: Single point cane ?Level of assistance: Complete Independence ?Comments: Pt with moderate difficulty and antalgic gait due to Left LE weakness and pain at 8/10.  Pt did negotiate steps with step to gait and use of cane and handrail ?  ?  ?  ?[facility]'S TREATMENT: ?[facility]                                                [facility]: 10-22-21 ?Gait with cane  quad set ?Therapeutic Exercise: ?Sitting hip flexion with RTB 30 x with 3 sec hold ?Attempted standing hip flexion resisted and pt internally rotates Left hip and DC until stronger ?Educated on [employer] set and added to HEP ?PT assists with eccentric SLR 2 x 10 ?Hip abduction 2 x 10 on L with left socked foot on bolster for guide for alignment to simulate wall at home ?Supine bent knee fall out 2 x 10 on R and L ?Heel raise bil 2 x 10 ?Sit to stand from chair 16 x to fatigue ? ?GAIT ? Pt enters clinic with antalgic gait and utilizing collapsible cane that she fell with.  Pt told to get a more sturdy cane and utilized sturdy cane in provided  by clinic.  ? ? ?[facility]  [facility]: 10[facility]20-[facility] ?Therapeutic Exercise: ?Heel raise  bil 2 x 10 ?Heel raise on R x 10 ?Heel raise on L unable initially and then slowly to 9 ?Standing Hip abduction with UE support 2 x 10 on R and L ?Standing Hip extension with UE support 2 x 10 R and L ?Standing marching 1 x 10 R and L ?Sit to stand with VC for even wt distribution 2 x 10 ?LAQ with 2 x 10 R and L ?SAQ 1 x 10 R and L ?Supine heel slides 2 x 10 Left and R ?Supine abduction 15 x on L ?Supine bent knee fall out 2 x 10 on R and L ?Sidelyng R for left hip abduction 2 x 10 with VC and TC from PT ?GAIT- pt gait trained for 200 feet with use of solid cane with  proper adjustment ?Pt with antalgic gait and trunk lean to R, improved at end of session ? ? ? ?4-4-23Issued HEP and EVAL ?  ?  ?PATIENT EDUCATION:  ?Education details: POC, explanation of findings , HEP issued , LEFS explanation 10/80, HEP given and updated ?Person educated: Patient ?Education method: Explanation, Demonstration, Tactile cues, Verbal cues, and Handouts ?Education comprehension: verbalized understanding, returned demonstration, verbal cues required, tactile cues required, and needs further education ?  ?  ?HOME EXERCISE PROGRAM: ?Access Code: [email or web address] ?URL: [email or web address] ?Date: 10/13/2021 ?Prepared by: Garen Lah ?  ?Exercises ?- sit to stand with Hands on knees or on sink with chair behind  - 1 x daily - 7 x weekly - 3 sets - 10 reps ?- short arc quad with gluteal tightning  - 1 x daily - 7 x weekly - 3 sets - 10 reps ?- Supine Heel Slide with Strap  - 1 x daily - 7 x weekly - 3 sets - 10 reps ?- Supine Ankle Pumps  - 1 x daily - 7 x weekly - 3 sets - 10 reps ?- Supine Hip Abduction  - 1 x daily - 7 x weekly - 3 sets - 10 reps ?- Supine Single Bent Knee Fallout  - 1 x daily - 7 x weekly - 3 sets - 10 reps ?- Long Arc Quad  - 1 x daily - 7 x weekly - 3 sets - 10 reps ? ?Added 10-20-21 ? ?- Sidelying Hip Abduction  - 1 x daily - 7 x weekly - 3 sets - 10 reps ?- Supine Single Bent Knee Fallout  - 1 x daily - 7 x  weekly - 3 sets - 10 reps ?- Standing Hip Abduction  - 1 x daily - 7 x weekly - 3 sets - 10 reps ?- Standing Hip Flexion March  - 1 x daily - 7 x weekly - 3 sets - 10 reps ?- Standing Hip Extension with

## 2021-10-22 ENCOUNTER — Encounter: Payer: Self-pay | Admitting: Physical Therapy

## 2021-10-22 ENCOUNTER — Ambulatory Visit: Payer: Medicaid Other | Admitting: Physical Therapy

## 2021-10-22 DIAGNOSIS — M542 Cervicalgia: Secondary | ICD-10-CM

## 2021-10-22 DIAGNOSIS — M25552 Pain in left hip: Secondary | ICD-10-CM | POA: Diagnosis not present

## 2021-10-22 DIAGNOSIS — R262 Difficulty in walking, not elsewhere classified: Secondary | ICD-10-CM

## 2021-10-22 DIAGNOSIS — R252 Cramp and spasm: Secondary | ICD-10-CM

## 2021-10-22 DIAGNOSIS — M6281 Muscle weakness (generalized): Secondary | ICD-10-CM

## 2021-10-22 NOTE — Patient Instructions (Addendum)
? ?  Voncille Lo, PT, ATRIC ?Certified Exercise Expert for the Aging Adult  ?10/22/21 9:31 AM ?Phone: [phone] ?Fax: [phone]  ?

## 2021-10-26 ENCOUNTER — Ambulatory Visit: Payer: Medicaid Other | Admitting: Physical Therapy

## 2021-10-26 ENCOUNTER — Encounter: Payer: Self-pay | Admitting: Physical Therapy

## 2021-10-26 DIAGNOSIS — M25552 Pain in left hip: Secondary | ICD-10-CM

## 2021-10-26 DIAGNOSIS — M6281 Muscle weakness (generalized): Secondary | ICD-10-CM

## 2021-10-26 DIAGNOSIS — M542 Cervicalgia: Secondary | ICD-10-CM

## 2021-10-26 DIAGNOSIS — R262 Difficulty in walking, not elsewhere classified: Secondary | ICD-10-CM

## 2021-10-26 DIAGNOSIS — R252 Cramp and spasm: Secondary | ICD-10-CM

## 2021-10-26 NOTE — Therapy (Signed)
?OUTPATIENT PHYSICAL THERAPY TREATMENT NOTE ? ? ?Patient Name: Kristin Montes ?MRN: [id number] ?DOB:February 07, 1976, 46 y.o., female ?Today's Date: 10/26/2021 ? ?PCP: Georganna Skeans, MD ?REFERRING PROVIDER: Georganna Skeans, MD ? ?END OF SESSION:  ? PT End of Session - 10/26/21 1024   ? ? Visit Number 4   ? Number of Visits 16   ? Date for PT Re-Evaluation 02/02/22   ? Authorization Type [employer] MCD No vaso, ionto, estim or traction   ? PT Start Time 1017   ? PT Stop Time 1059   ? PT Time Calculation (min) 42 min   ? Activity Tolerance Patient tolerated treatment well   ? Behavior During Therapy [facility] for tasks assessed/performed   ? ?  ?  ? ?  ? ? ? ? ?Past Medical History:  ?Diagnosis Date  ? Arthritis   ? Complication of anesthesia   ? slow to wake up with nephrectomy  ? Depression   ? Headache   ? History of kidney stones   ? Pneumonia   ? ?Past Surgical History:  ?Procedure Laterality Date  ? HERNIA REPAIR    ? NEPHRECTOMY    ? TOTAL HIP ARTHROPLASTY Left 08/18/2021  ? Procedure: TOTAL HIP ARTHROPLASTY ANTERIOR APPROACH;  Surgeon: Durene Romans, MD;  Location: [facility];  Service: Orthopedics;  Laterality: Left;  ? TUBAL LIGATION    ? ?Patient Active Problem List  ? Diagnosis Date Noted  ? S/P total left hip arthroplasty 08/18/2021  ? DDD (degenerative disc disease), cervical 07/28/2021  ? Osteoarthritis of hip 06/13/2021  ? Chronic left hip pain 05/19/2021  ? Foraminal stenosis of cervical region 01/23/2021  ? Trichomonal infection 12/17/2020  ? Herniated cervical disc 09/28/2020  ? 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 ? ?REFERRING DIAG: N86.767 (ICD-10-CM) - S/P total left hip arthroplasty ? ?THERAPY DIAG:  ?Pain in left hip ? ?Muscle weakness (generalized) ? ?Difficulty in walking, not elsewhere classified ? ?Cervicalgia ? ?Cramp and spasm ? ?PERTINENT HISTORY: S/p nephrectomy 2003, Trichomonal infection. Foraminal stenosis of cervical region( receiving cortisone injections, chronic Left hip pain, OA of both hips, DDD,  herniated Cervical disc ? ?PRECAUTIONS: Anterior Hip Precautians ?WEIGHT BEARING RESTRICTIONS Yes WBAT on L ? ?FALLS:  ?Has patient fallen in last 6 months? Yes. Number of falls 3  fell before surgery but now feel like I am tripping up sometimes because of my L knee and my ankle ? ?SUBJECTIVE: I got a new cane and I need some rubber tips on it.  But I did not bring it today. I am doing well today 5/10.  I am feeling hot today.  ?  ? ? ? ?PAIN: ?Are you having pain? Are you having pain? Yes: NPRS scale: 5/10 pain  ?Pain location: left hip and in buttocks and in my inguinal line, sometimes radiates into my knee ?Pain description: aching pain ?Aggravating factors: standing for longer than 10 minutes, sitting for longer than 20 minutes. Walking for longer than 15 min  , putting on sock and putting on pants, unable to do household chores, getting in the shower and picking up leg over tub ?Relieving factors: propping my feet up, taking muscle relaxers and pain medication, ice ? ? ? ? ?  ?OBJECTIVE:  ?  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 Dedicated CT or MRI ?could confirm. ?08-18-21 IMPRESSION: POST OA dx ?No evidence of left hip arthroplasty complication. ?  ?PATIENT SURVEYS:  ?LEFS 10/80 ?  ?COGNITION: ?  Overall cognitive status: Within functional limits for tasks assessed               ?           ?SENSATION: ?Light touch: Impaired  numbness over incision ?  ?MUSCLE LENGTH: ?Hamstrings: Right 60 deg; Left 50 degmust have PT assist on L ?Thomas test: NT post surgery ?  ?POSTURE:  ?Left hip level lower than R ?  ?PALPATION: ?Well healed anterior hip scar for THA on L, TTP over Left gluteals down ITB and piriformis ?  ?LE ROM: ?  ?Active ROM Right ?10/13/2021 Left ?10/13/2021  ?Hip flexion 90 60  ?Hip extension 30 5  ?Hip abduction 38 20  ?Hip adduction      ?Hip internal rotation      ?Hip external rotation      ?Knee flexion 135/140 137141  ?Knee extension 0 0   ?Ankle dorsiflexion      ?Ankle plantarflexion      ?Ankle inversion      ?Ankle eversion      ? (Blank rows = not tested) ?  ?LE MMT:    unable to perform SLR with Left leg 10-13-21 ?  ?MMT Right ?10/13/2021 Left ?10/13/2021  ?Hip flexion 4- 3-  ?Hip extension 4- 3-  ?Hip abduction 4- 2+  ?Hip adduction      ?Hip internal rotation 4- 3-  ?Hip external rotation 4- 3-  ?Knee flexion 4+ 4  ?Knee extension 4 4  ?Ankle dorsiflexion      ?Ankle plantarflexion      ?Ankle inversion      ?Ankle eversion      ? (Blank rows = not tested) ?  ?LOWER EXTREMITY SPECIAL TESTS:  ?NT due to post L anterior THA ?  ?FUNCTIONAL TESTS:  ?5 times sit to stand: 32 sec  pain and must use hands  10-26-21 15.52 sec ?6 minute walk test: TBA ?  ?GAIT: ?Distance walked: 120 feet ?Assistive device utilized: Single point cane ?Level of assistance: Complete Independence ?Comments: Pt with moderate difficulty and antalgic gait due to Left LE weakness and pain at 8/10.  Pt did negotiate steps with step to gait and use of cane and handrail ?  ?  ?  ?TODAY'S TREATMENT: ? ?[facility] Adult PT Treatment:                                                DATE: 10-26-21 ?Therapeutic Exercise: ?Nustep 6 min UE/LE level 3 ?Step ups on 8 in step 2 x 10 on R and L ?Cybex hip abduction 12.5 # 1 x 10, 25 # 2 x 10 both left, then Right side 25# 2 x 10 ?Cybex hip extension 25 # 2 x 10 on R and then L ?Sit to stand  25 # 1 x 10 2 min rest  then 1 x 10 ?Left heel shin to knee 1 x 10 with help by PT for 5 additional for increased range of motion ?Sitting resisted hip flexion with GTB 2 x 10 ?Deadlift with elevated with box 14 in with 25 # and pt facing away from box for decreased challenge 2 x 10 ? ?GAIT- walking without cane in front of mirror, cue for heel toe in ll bars without cane ?Heel lift in left shoes 1/8th inch for LLD ?Side stepping and backward stepping ?Forward  stepping with mirror ? ?[facility]:                                                DATE:  10-22-21 ? ?Therapeutic Exercise: ?Sitting hip flexion with RTB 30 x with 3 sec hold ?Attempted standing hip flexion resisted and pt internally rotates Left hip and DC until stronger ?Educated on [employer] set and added to HEP ?PT assists with eccentric SLR 2 x 10 ?Hip abduction 2 x 10 on L with left socked foot on bolster for guide for alignment to simulate wall at home ?Supine bent knee fall out 2 x 10 on R and L ?Heel raise bil 2 x 10 ?Sit to stand from chair 16 x to fatigue ? ?GAIT ? Pt enters clinic with antalgic gait and utilizing collapsible cane that she fell with.  Pt told to get a more sturdy cane and utilized sturdy cane in provided  by clinic.  ? ? ?[facility]                                                DATE: 10-20-21 ?Therapeutic Exercise: ?Heel raise bil 2 x 10 ?Heel raise on R x 10 ?Heel raise on L unable initially and then slowly to 9 ?Standing Hip abduction with UE support 2 x 10 on R and L ?Standing Hip extension with UE support 2 x 10 R and L ?Standing marching 1 x 10 R and L ?Sit to stand with VC for even wt distribution 2 x 10 ?LAQ with 2 x 10 R and L ?SAQ 1 x 10 R and L ?Supine heel slides 2 x 10 Left and R ?Supine abduction 15 x on L ?Supine bent knee fall out 2 x 10 on R and L ?Sidelyng R for left hip abduction 2 x 10 with VC and TC from PT ?GAIT- pt gait trained for 200 feet with use of solid cane with  proper adjustment ?Pt with antalgic gait and trunk lean to R, improved at end of session ? ? ? ?4-4-23Issued HEP and EVAL ?  ?  ?PATIENT EDUCATION:  ?Education details: POC, explanation of findings , HEP issued , LEFS explanation 10/80, HEP given and updated ?Person educated: Patient ?Education method: Explanation, Demonstration, Tactile cues, Verbal cues, and Handouts ?Education comprehension: verbalized understanding, returned demonstration, verbal cues required, tactile cues required, and needs further education ?  ?  ?HOME EXERCISE PROGRAM: ?Access Code: [email or web address] ?URL:  [email or web address] ?Date: 10/13/2021 ?Prepared by: Garen LahLawrie Brettany Sydney ?  ?Exercises ?- sit to stand with Hands on knees or on sink with chair behind  - 1 x daily - 7 x weekly - 3 sets - 10 reps ?- short arc quad

## 2021-10-28 ENCOUNTER — Encounter: Payer: Self-pay | Admitting: Physical Therapy

## 2021-10-28 ENCOUNTER — Ambulatory Visit: Payer: Medicaid Other | Admitting: Physical Therapy

## 2021-10-28 DIAGNOSIS — R262 Difficulty in walking, not elsewhere classified: Secondary | ICD-10-CM

## 2021-10-28 DIAGNOSIS — M6281 Muscle weakness (generalized): Secondary | ICD-10-CM

## 2021-10-28 DIAGNOSIS — M25552 Pain in left hip: Secondary | ICD-10-CM | POA: Diagnosis not present

## 2021-10-28 NOTE — Therapy (Signed)
?OUTPATIENT PHYSICAL THERAPY TREATMENT NOTE ? ? ?Patient Name: Kristin Montes ?MRN: [id number] ?DOB:29-Apr-1976, 46 y.o., female ?Today's Date: 10/28/2021 ? ?PCP: Georganna Skeans, MD ?REFERRING PROVIDER: Georganna Skeans, MD ? ?END OF SESSION:  ? PT End of Session - 10/28/21 0935   ? ? Visit Number 5   ? Number of Visits 16   ? Date for PT Re-Evaluation 02/02/22   ? Authorization Type [employer] No vaso, ionto, estim or traction   ? PT Start Time [phone]   ? PT Stop Time 1013   ? PT Time Calculation (min) 39 min   ? ?  ?  ? ?  ? ? ? ? ?Past Medical History:  ?Diagnosis Date  ? Arthritis   ? Complication of anesthesia   ? slow to wake up with nephrectomy  ? Depression   ? Headache   ? History of kidney stones   ? Pneumonia   ? ?Past Surgical History:  ?Procedure Laterality Date  ? HERNIA REPAIR    ? NEPHRECTOMY    ? TOTAL HIP ARTHROPLASTY Left 08/18/2021  ? Procedure: TOTAL HIP ARTHROPLASTY ANTERIOR APPROACH;  Surgeon: Durene Romans, MD;  Location: [facility];  Service: Orthopedics;  Laterality: Left;  ? TUBAL LIGATION    ? ?Patient Active Problem List  ? Diagnosis Date Noted  ? S/P total left hip arthroplasty 08/18/2021  ? DDD (degenerative disc disease), cervical 07/28/2021  ? Osteoarthritis of hip 06/13/2021  ? Chronic left hip pain 05/19/2021  ? Foraminal stenosis of cervical region 01/23/2021  ? Trichomonal infection 12/17/2020  ? Herniated cervical disc 09/28/2020  ? 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 ? ?REFERRING DIAG: Y30.160 (ICD-10-CM) - S/P total left hip arthroplasty ? ?THERAPY DIAG:  ?Pain in left hip ? ?Muscle weakness (generalized) ? ?Difficulty in walking, not elsewhere classified ? ?PERTINENT HISTORY: S/p nephrectomy 2003, Trichomonal infection. Foraminal stenosis of cervical region( receiving cortisone injections, chronic Left hip pain, OA of both hips, DDD, herniated Cervical disc ? ?PRECAUTIONS: Anterior Hip Precautians ?WEIGHT BEARING RESTRICTIONS Yes WBAT on L ? ?FALLS:  ?Has patient fallen in last 6 months?  Yes. Number of falls 3  fell before surgery but now feel like I am tripping up sometimes because of my L knee and my ankle ? ?SUBJECTIVE: I hit my left knee while getting into the tub yesterday. It forced my leg out to the side and caused sharp 9-10/10 pain. I just sat on the side of the tub instead of getting inside. I could not sleep and tossed and turned due to pain. The pain is a 7/10 now. I used my ice pack and took muscle relaxer.   ? ? ? ?PAIN: ?Are you having pain? Are you having pain? Yes: NPRS scale: 7/10 pain  ?Pain location: left lateral hip  ?Pain description: sharp ?Aggravating factors: hitting knee on tub   ?Relieving factors: propping my feet up, taking muscle relaxers and pain medication, ice ? ? ? ? ?  ?OBJECTIVE:  ?  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 Dedicated CT or MRI ?could confirm. ?08-18-21 IMPRESSION: POST OA dx ?No evidence of left hip arthroplasty complication. ?  ?PATIENT SURVEYS:  ?LEFS 10/80 ?  ?COGNITION: ?          Overall cognitive status: Within functional limits for tasks assessed               ?           ?SENSATION: ?Light touch: Impaired  numbness  over incision ?  ?MUSCLE LENGTH: ?Hamstrings: Right 60 deg; Left 50 degmust have PT assist on L ?Thomas test: NT post surgery ?  ?POSTURE:  ?Left hip level lower than R ?  ?PALPATION: ?Well healed anterior hip scar for THA on L, TTP over Left gluteals down ITB and piriformis ?  ?LE ROM: ?  ?Active ROM Right ?10/13/2021 Left ?10/13/2021 Left  ?10/28/21  ?Hip flexion 90 60 100 AROM  ?Hip extension 30 5   ?Hip abduction 38 20   ?Hip adduction       ?Hip internal rotation       ?Hip external rotation       ?Knee flexion 135/140 137141   ?Knee extension 0 0   ?Ankle dorsiflexion       ?Ankle plantarflexion       ?Ankle inversion       ?Ankle eversion       ? (Blank rows = not tested) ?  ?LE MMT:    unable to perform SLR with Left leg 10-13-21 ?  ?MMT Right ?10/13/2021 Left ?10/13/2021  ?Hip flexion  4- 3-  ?Hip extension 4- 3-  ?Hip abduction 4- 2+  ?Hip adduction      ?Hip internal rotation 4- 3-  ?Hip external rotation 4- 3-  ?Knee flexion 4+ 4  ?Knee extension 4 4  ?Ankle dorsiflexion      ?Ankle plantarflexion      ?Ankle inversion      ?Ankle eversion      ? (Blank rows = not tested) ?  ?LOWER EXTREMITY SPECIAL TESTS:  ?NT due to post L anterior THA ?  ?FUNCTIONAL TESTS:  ?5 times sit to stand: 32 sec  pain and must use hands  10-26-21 15.52 sec ?6 minute walk test: TBA ?  ?GAIT: ?Distance walked: 120 feet ?Assistive device utilized: Single point cane ?Level of assistance: Complete Independence ?Comments: Pt with moderate difficulty and antalgic gait due to Left LE weakness and pain at 8/10.  Pt did negotiate steps with step to gait and use of cane and handrail ?  ?  ?  ?TODAY'S TREATMENT: ?[facility] Adult PT Treatment:                                                DATE: 10/28/21 ?Therapeutic Exercise: ?Nustep L2 LE only x 6 min ?Alteranting March x 10 ?Bilat heel raise  ?Small runners stretch 10 sec x 3 each  ?Seated LAQ x 20 Left ?Small ROM hip flexor dangle ankle off side  ?Bridge x 10  ?Supine AROM clam ?Hooklying pelvic rocking- anterior and posterior.  ?Hamstring stretch with strap and PTA assist ?Passive hip abduction stretch 10 sec x 3  ?QS into towel 5 sec x 10  ?Small ROM LTR  ? ? ? ? ?[facility] Adult PT Treatment:                                                DATE: 10-26-21 ?Therapeutic Exercise: ?Nustep 6 min UE/LE level 3 ?Step ups on 8 in step 2 x 10 on R and L ?Cybex hip abduction 12.5 # 1 x 10, 25 # 2 x 10 both left, then Right side 25# 2 x 10 ?  Cybex hip extension 25 # 2 x 10 on R and then L ?Sit to stand  25 # 1 x 10 2 min rest  then 1 x 10 ?Left heel shin to knee 1 x 10 with help by PT for 5 additional for increased range of motion ?Sitting resisted hip flexion with GTB 2 x 10 ?Deadlift with elevated with box 14 in with 25 # and pt facing away from box for decreased challenge 2 x 10 ? ?GAIT- walking  without cane in front of mirror, cue for heel toe in ll bars without cane ?Heel lift in left shoes 1/8th inch for LLD ?Side stepping and backward stepping ?Forward stepping with mirror ? ?[facility]                                                DATE: 10-22-21 ? ?Therapeutic Exercise: ?Sitting hip flexion with RTB 30 x with 3 sec hold ?Attempted standing hip flexion resisted and pt internally rotates Left hip and DC until stronger ?Educated on [employer] set and added to HEP ?PT assists with eccentric SLR 2 x 10 ?Hip abduction 2 x 10 on L with left socked foot on bolster for guide for alignment to simulate wall at home ?Supine bent knee fall out 2 x 10 on R and L ?Heel raise bil 2 x 10 ?Sit to stand from chair 16 x to fatigue ? ?GAIT ? Pt enters clinic with antalgic gait and utilizing collapsible cane that she fell with.  Pt told to get a more sturdy cane and utilized sturdy cane in provided  by clinic.  ? ? ?[facility]                                                DATE: 10-20-21 ?Therapeutic Exercise: ?Heel raise bil 2 x 10 ?Heel raise on R x 10 ?Heel raise on L unable initially and then slowly to 9 ?Standing Hip abduction with UE support 2 x 10 on R and L ?Standing Hip extension with UE support 2 x 10 R and L ?Standing marching 1 x 10 R and L ?Sit to stand with VC for even wt distribution 2 x 10 ?LAQ with 2 x 10 R and L ?SAQ 1 x 10 R and L ?Supine heel slides 2 x 10 Left and R ?Supine abduction 15 x on L ?Supine bent knee fall out 2 x 10 on R and L ?Sidelyng R for left hip abduction 2 x 10 with VC and TC from PT ?GAIT- pt gait trained for 200 feet with use of solid cane with  proper adjustment ?Pt with antalgic gait and trunk lean to R, improved at end of session ? ? ? ?4-4-23Issued HEP and EVAL ?  ?  ?PATIENT EDUCATION:  ?Education details: POC, explanation of findings , HEP issued , LEFS explanation 10/80, HEP given and updated ?Person educated: Patient ?Education method: Explanation,  Demonstration, Tactile cues, Verbal cues, and Handouts ?Education comprehension: verbalized understanding, returned demonstration, verbal cues required, tactile cues required, and needs further education ?

## 2021-11-02 NOTE — Therapy (Incomplete)
?OUTPATIENT PHYSICAL THERAPY TREATMENT NOTE ? ? ?Patient Name: Kristin Montes ?MRN: [id number] ?DOB:[birth year], 46 y.o., female ?Today's Date: 11/02/2021 ? ?PCP: Georganna Skeans, MD ?REFERRING PROVIDER: Georganna Skeans, MD ? ?END OF SESSION:  ? ? ? ? ? ?Past Medical History:  ?Diagnosis Date  ? Arthritis   ? Complication of anesthesia   ? slow to wake up with nephrectomy  ? Depression   ? Headache   ? History of kidney stones   ? Pneumonia   ? ?Past Surgical History:  ?Procedure Laterality Date  ? HERNIA REPAIR    ? NEPHRECTOMY    ? TOTAL HIP ARTHROPLASTY Left 08/18/2021  ? Procedure: TOTAL HIP ARTHROPLASTY ANTERIOR APPROACH;  Surgeon: Durene Romans, MD;  Location: [facility];  Service: Orthopedics;  Laterality: Left;  ? TUBAL LIGATION    ? ?Patient Active Problem List  ? Diagnosis Date Noted  ? S/P total left hip arthroplasty 08/18/2021  ? DDD (degenerative disc disease), cervical 07/28/2021  ? Osteoarthritis of hip 06/13/2021  ? Chronic left hip pain 05/19/2021  ? Foraminal stenosis of cervical region 01/23/2021  ? Trichomonal infection 12/17/2020  ? Herniated cervical disc 09/28/2020  ? 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 ? ?REFERRING DIAG: M09.470 (ICD-10-CM) - S/P total left hip arthroplasty ? ?THERAPY DIAG:  ?No diagnosis found. ? ?PERTINENT HISTORY: S/p nephrectomy 2003, Trichomonal infection. Foraminal stenosis of cervical region( receiving cortisone injections, chronic Left hip pain, OA of both hips, DDD, herniated Cervical disc ? ?PRECAUTIONS: Anterior Hip Precautians ?WEIGHT BEARING RESTRICTIONS Yes WBAT on L ? ?FALLS:  ?Has patient fallen in last 6 months? Yes. Number of falls 3  fell before surgery but now feel like I am tripping up sometimes because of my L knee and my ankle ? ?SUBJECTIVE: I hit my left knee while getting into the tub yesterday. It forced my leg out to the side and caused sharp 9-10/10 pain. I just sat on the side of the tub instead of getting inside. I could not sleep and tossed and turned due to  pain. The pain is a 7/10 now. I used my ice pack and took muscle relaxer.   ? ? ? ?PAIN: ?Are you having pain? Are you having pain? Yes: NPRS scale: 7/10 pain  ?Pain location: left lateral hip  ?Pain description: sharp ?Aggravating factors: hitting knee on tub   ?Relieving factors: propping my feet up, taking muscle relaxers and pain medication, ice ? ? ? ? ?  ?OBJECTIVE:  ?  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 Dedicated CT or MRI ?could confirm. ?08-18-21 IMPRESSION: POST OA dx ?No evidence of left hip arthroplasty complication. ?  ?PATIENT SURVEYS:  ?LEFS 10/80 ?  ?COGNITION: ?          Overall cognitive status: Within functional limits for tasks assessed               ?           ?SENSATION: ?Light touch: Impaired  numbness over incision ?  ?MUSCLE LENGTH: ?Hamstrings: Right 60 deg; Left 50 degmust have PT assist on L ?Thomas test: NT post surgery ?  ?POSTURE:  ?Left hip level lower than R ?  ?PALPATION: ?Well healed anterior hip scar for THA on L, TTP over Left gluteals down ITB and piriformis ?  ?LE ROM: ?  ?Active ROM Right ?10/13/2021 Left ?10/13/2021 Left  ?10/28/21  ?Hip flexion 90 60 100 AROM  ?Hip extension 30 5   ?Hip abduction 38 20   ?  Hip adduction       ?Hip internal rotation       ?Hip external rotation       ?Knee flexion 135/140 137141   ?Knee extension 0 0   ?Ankle dorsiflexion       ?Ankle plantarflexion       ?Ankle inversion       ?Ankle eversion       ? (Blank rows = not tested) ?  ?LE MMT:    unable to perform SLR with Left leg 10-13-21 ?  ?MMT Right ?10/13/2021 Left ?10/13/2021  ?Hip flexion 4- 3-  ?Hip extension 4- 3-  ?Hip abduction 4- 2+  ?Hip adduction      ?Hip internal rotation 4- 3-  ?Hip external rotation 4- 3-  ?Knee flexion 4+ 4  ?Knee extension 4 4  ?Ankle dorsiflexion      ?Ankle plantarflexion      ?Ankle inversion      ?Ankle eversion      ? (Blank rows = not tested) ?  ?LOWER EXTREMITY SPECIAL TESTS:  ?NT due to post L anterior THA ?   ?FUNCTIONAL TESTS:  ?5 times sit to stand: 32 sec  pain and must use hands  10-26-21 15.52 sec ?6 minute walk test: TBA ?  ?GAIT: ?Distance walked: 120 feet ?Assistive device utilized: Single point cane ?Level of assistance: Complete Independence ?Comments: Pt with moderate difficulty and antalgic gait due to Left LE weakness and pain at 8/10.  Pt did negotiate steps with step to gait and use of cane and handrail ?  ?  ?  ?TODAY'S TREATMENT: ? ?[facility] Adult PT Treatment:                                                DATE: 11-03-21 ?Therapeutic Exercise: ?*** ?Manual Therapy: ?*** ?Neuromuscular re-ed: ?*** ?Therapeutic Activity: ?*** ?Modalities: ?*** ?Self Care: ?***  ? ?[facility] Adult PT Treatment:                                                DATE: 10/28/21 ?Therapeutic Exercise: ?Nustep L2 LE only x 6 min ?Alteranting March x 10 ?Bilat heel raise  ?Small runners stretch 10 sec x 3 each  ?Seated LAQ x 20 Left ?Small ROM hip flexor dangle ankle off side  ?Bridge x 10  ?Supine AROM clam ?Hooklying pelvic rocking- anterior and posterior.  ?Hamstring stretch with strap and PTA assist ?Passive hip abduction stretch 10 sec x 3  ?QS into towel 5 sec x 10  ?Small ROM LTR  ? ? ? ? ?[facility] Adult PT Treatment:                                                DATE: 10-26-21 ?Therapeutic Exercise: ?Nustep 6 min UE/LE level 3 ?Step ups on 8 in step 2 x 10 on R and L ?Cybex hip abduction 12.5 # 1 x 10, 25 # 2 x 10 both left, then Right side 25# 2 x 10 ?Cybex hip extension 25 # 2 x 10 on R and then L ?Sit to  stand  25 # 1 x 10 2 min rest  then 1 x 10 ?Left heel shin to knee 1 x 10 with help by PT for 5 additional for increased range of motion ?Sitting resisted hip flexion with GTB 2 x 10 ?Deadlift with elevated with box 14 in with 25 # and pt facing away from box for decreased challenge 2 x 10 ? ?GAIT- walking without cane in front of mirror, cue for heel toe in ll bars without cane ?Heel lift in left shoes 1/8th inch for LLD ?Side  stepping and backward stepping ?Forward stepping with mirror ? ?[facility]                                                [facility]: [facility] ? ?Therapeutic Exercise: ?Sitting hip flexion with RTB 30 x with 3 sec hold ?Attempted standing hip flexion resisted and pt internally rotates Left hip and DC until stronger ?Educated on [employer] set and added to HEP ?PT assists with eccentric SLR 2 x 10 ?Hip abduction 2 x 10 on L with left socked foot on bolster for guide for alignment to simulate wall at home ?Supine bent knee fall out 2 x 10 on R and L ?Heel raise bil 2 x 10 ?Sit to stand from chair 16 x to fatigue ? ?GAIT ? Pt enters clinic with antalgic gait and utilizing collapsible cane that she fell with.  Pt told to get a more sturdy cane and utilized sturdy cane in provided  by clinic.  ? ? ?[facility]                                                DATE: 10-20-21 ?Therapeutic Exercise: ?Heel raise bil 2 x 10 ?Heel raise on R x 10 ?Heel raise on L unable initially and then slowly to 9 ?Standing Hip abduction with UE support 2 x 10 on R and L ?Standing Hip extension with UE support 2 x 10 R and L ?Standing marching 1 x 10 R and L ?Sit to stand with VC for even wt distribution 2 x 10 ?LAQ with 2 x 10 R and L ?SAQ 1 x 10 R and L ?Supine heel slides 2 x 10 Left and R ?Supine abduction 15 x on L ?Supine bent knee fall out 2 x 10 on R and L ?Sidelyng R for left hip abduction 2 x 10 with VC and TC from PT ?GAIT- pt gait trained for 200 feet with use of solid cane with  proper adjustment ?Pt with antalgic gait and trunk lean to R, improved at end of session ? ? ? ?4-4-23Issued HEP and EVAL ?  ?  ?PATIENT EDUCATION:  ?Education details: POC, explanation of findings , HEP issued , LEFS explanation 10/80, HEP given and updated ?Person educated: Patient ?Education method: Explanation, Demonstration, Tactile cues, Verbal cues, and Handouts ?Education comprehension: verbalized understanding, returned demonstration,  verbal cues required, tactile cues required, and needs further education ?  ?  ?HOME EXERCISE PROGRAM: ?Access Code: [email or web address] ?URL: [email or web address] ?Date: 10/13/2021 ?Prepared by: Kathrynn HumbleLawrie Beards

## 2021-11-03 ENCOUNTER — Ambulatory Visit: Payer: Medicaid Other | Admitting: Physical Therapy

## 2021-11-04 ENCOUNTER — Telehealth: Payer: Self-pay | Admitting: *Deleted

## 2021-11-04 NOTE — Telephone Encounter (Signed)
The patient called to report severe headache for four days. Nausea has resolved but still having significant light/noise sensitivity. She has tried Aleve and cyclobenzaprine. Also, tried her last dose of Lortab (leftover from recent surgery). No relief. Resting in dark room today. ? ?She was seen 06/24/21 for headaches. Started on amitriptyline 50mg  at bedtime. She was doing well on the medication. She had left hip replacement on 09/07/21. Says the amitriptyline was temporarily stopped while she was placed on pregabalin 75mg  QHS for pain. Once she completed the pregabalin, she never restarted the amitriptyline. Since then, her PCP has started her on duloxetine 30mg  daily for anxiety. ? ?She has a pending appt w/ Dr. 09/09/21 on 11/10/21 to discuss new treatment plan. ? ?She is asking for medical advice to get through her current, acute headache until she is seen. ?

## 2021-11-05 ENCOUNTER — Ambulatory Visit (INDEPENDENT_AMBULATORY_CARE_PROVIDER_SITE_OTHER): Payer: Medicaid Other | Admitting: Family Medicine

## 2021-11-05 ENCOUNTER — Encounter: Payer: Self-pay | Admitting: Family Medicine

## 2021-11-05 VITALS — BP 142/92 | HR 78 | Temp 98.0°F | Resp 16 | Wt 165.2 lb

## 2021-11-05 DIAGNOSIS — F4321 Adjustment disorder with depressed mood: Secondary | ICD-10-CM | POA: Diagnosis not present

## 2021-11-05 DIAGNOSIS — Z96642 Presence of left artificial hip joint: Secondary | ICD-10-CM | POA: Diagnosis not present

## 2021-11-05 MED ORDER — DULOXETINE HCL 30 MG PO CPEP: 30.0000 mg | ORAL_CAPSULE | Freq: Two times a day (BID) | ORAL | 0 refills | Status: DC

## 2021-11-05 NOTE — Telephone Encounter (Signed)
I spoke to the patient. She was seen by her orthopaedic physician on 11/04/21. She was prescribed a prednisone dose pack for her headache. She will keep her pending following up on 11/10/21. ?

## 2021-11-05 NOTE — Progress Notes (Signed)
Patient said that she is doing well today. Patient said that last week  was bit of a challenge, so good days and bad. ? ?Patient said that she need a note for [employer] stating that she is no longer working as of now ? ?Patient is requesting a cane to help stabilize her when she walks. ?

## 2021-11-06 ENCOUNTER — Encounter: Payer: Self-pay | Admitting: Physical Therapy

## 2021-11-06 ENCOUNTER — Encounter: Payer: Self-pay | Admitting: Family Medicine

## 2021-11-06 ENCOUNTER — Ambulatory Visit: Payer: Medicaid Other | Admitting: Physical Therapy

## 2021-11-06 DIAGNOSIS — M6281 Muscle weakness (generalized): Secondary | ICD-10-CM

## 2021-11-06 DIAGNOSIS — M25552 Pain in left hip: Secondary | ICD-10-CM | POA: Diagnosis not present

## 2021-11-06 DIAGNOSIS — R262 Difficulty in walking, not elsewhere classified: Secondary | ICD-10-CM

## 2021-11-06 NOTE — Progress Notes (Signed)
? ?Established Patient Office Visit ? ?Subjective   ? ?Patient ID: Kristin Montes, female    DOB: 1975/09/28  Age: 46 y.o. MRN: [id number] ? ?CC:  ?Chief Complaint  ?Patient presents with  ? Follow-up  ?  Hip arthroplasty   ? ? ?HPI ?Kristin Montes presents to follow up of right hip pain and depression. She reports that the cymbalta  and PT has been helpful and she does not take narcotics any more. She also reports that consultant has indicated that her muscles are not healing as he had expected ? ? ?Outpatient Encounter Medications as of 11/05/2021  ?Medication Sig  ? cyclobenzaprine (FLEXERIL) 10 MG tablet cyclobenzaprine 10 mg tablet ? Take 1 tablet 3 times a day by oral route.  ? docusate sodium (COLACE) 100 MG capsule Take 1 capsule (100 mg total) by mouth 2 (two) times daily.  ? HYDROcodone-acetaminophen (NORCO/VICODIN) 5-325 MG tablet every 12 (twelve) hours.  ? predniSONE (STERAPRED UNI-PAK 21 TAB) 5 MG (21) TBPK tablet Take by mouth.  ? [DISCONTINUED] DULoxetine (CYMBALTA) 30 MG capsule Take 1 capsule (30 mg total) by mouth daily.  ? DULoxetine (CYMBALTA) 30 MG capsule Take 1 capsule (30 mg total) by mouth 2 (two) times daily.  ? ?No facility-administered encounter medications on file as of 11/05/2021.  ? ? ?Past Medical History:  ?Diagnosis Date  ? Arthritis   ? Complication of anesthesia   ? slow to wake up with nephrectomy  ? Depression   ? Headache   ? History of kidney stones   ? Pneumonia   ? ? ?Past Surgical History:  ?Procedure Laterality Date  ? HERNIA REPAIR    ? NEPHRECTOMY    ? TOTAL HIP ARTHROPLASTY Left 08/18/2021  ? Procedure: TOTAL HIP ARTHROPLASTY ANTERIOR APPROACH;  Surgeon: Durene Romans, MD;  Location: [facility];  Service: Orthopedics;  Laterality: Left;  ? TUBAL LIGATION    ? ? ?History reviewed. No pertinent family history. ? ?Social History  ? ?Socioeconomic History  ? Marital status: Single  ?  Spouse name: Not on file  ? Number of children: Not on file  ? Years of education: Not on file  ?  Highest education level: Not on file  ?Occupational History  ? Not on file  ?Tobacco Use  ? Smoking status: Every Day  ?  Packs/day: 0.25  ?  Types: Cigarettes  ? Smokeless tobacco: Never  ? Tobacco comments:  ?  6 cigs/day  ?Vaping Use  ? Vaping Use: Never used  ?Substance and Sexual Activity  ? Alcohol use: Not Currently  ? 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 Sexual activity: Not on file  ?Other Topics Concern  ? Not on file  ?Social History Narrative  ? Not on file  ? ?Social Determinants of Health  ? ?Financial Resource Strain: Not on file  ?Food Insecurity: Not on file  ?Transportation Needs: Not on file  ?Physical Activity: Not on file  ?Stress: Not on file  ?Social Connections: Not on file  ?Intimate Partner Violence: Not on file  ? ? ?Review of Systems  ?All other systems reviewed and are negative. ? ?  ? ? ?Objective   ? ?BP (!) 142/92   Pulse 78   Temp 98 ?F (36.7 ?C) (Oral)   Resp 16   Wt 165 lb 3.2 oz (74.9 kg)   SpO2 93%   BMI 28.36 kg/m?  ? ?Physical Exam ?Vitals and nursing note reviewed.  ?  Constitutional:   ?   General: She is not in acute distress. ?Cardiovascular:  ?   Rate and Rhythm: Normal rate and regular rhythm.  ?Pulmonary:  ?   Effort: Pulmonary effort is normal.  ?   Breath sounds: Normal breath sounds.  ?Abdominal:  ?   Palpations: Abdomen is soft.  ?   Tenderness: There is no abdominal tenderness.  ?Musculoskeletal:  ?   Comments: Utilizing cane  ?Neurological:  ?   General: No focal deficit present.  ?   Mental Status: She is alert and oriented to person, place, and time.  ?Psychiatric:     ?   Mood and Affect: Mood normal.     ?   Behavior: Behavior normal. Behavior is cooperative.  ? ? ? ?  ? ?Assessment & Plan:  ? ?1. S/P total left hip arthroplasty ?Improving with PT and cymbalta  - continue ? ?2. Situational depression ?Improved - will increase cymbalta from 30 mg to 60 mg daily ? ? ? ?No follow-ups on file.  ? ?Tommie Raymond, MD ? ? ?

## 2021-11-06 NOTE — Therapy (Signed)
?OUTPATIENT PHYSICAL THERAPY TREATMENT NOTE ? ? ?Patient Name: Kristin Montes ?MRN: [id number] ?DOB:1976-01-08, 46 y.o., female ?Today's Date: 11/06/2021 ? ?PCP: Georganna Skeans, MD ?REFERRING PROVIDER: Georganna Skeans, MD ? ?END OF SESSION:  ? PT End of Session - 11/06/21 1023   ? ? Visit Number 6   ? Number of Visits 16   ? Date for PT Re-Evaluation 02/02/22   ? Authorization Type [employer] No vaso, ionto, estim or traction   ? Authorization Time Period 10/14/21-11/28/21, 12 visits   ? Authorization - Visit Number 5   ? Authorization - Number of Visits 12   ? PT Start Time 1015   ? PT Stop Time 1100   ? PT Time Calculation (min) 45 min   ? ?  ?  ? ?  ? ? ? ? ?Past Medical History:  ?Diagnosis Date  ? Arthritis   ? Complication of anesthesia   ? slow to wake up with nephrectomy  ? Depression   ? Headache   ? History of kidney stones   ? Pneumonia   ? ?Past Surgical History:  ?Procedure Laterality Date  ? HERNIA REPAIR    ? NEPHRECTOMY    ? TOTAL HIP ARTHROPLASTY Left 08/18/2021  ? Procedure: TOTAL HIP ARTHROPLASTY ANTERIOR APPROACH;  Surgeon: Durene Romans, MD;  Location: [facility];  Service: Orthopedics;  Laterality: Left;  ? TUBAL LIGATION    ? ?Patient Active Problem List  ? Diagnosis Date Noted  ? S/P total left hip arthroplasty 08/18/2021  ? DDD (degenerative disc disease), cervical 07/28/2021  ? Osteoarthritis of hip 06/13/2021  ? Chronic left hip pain 05/19/2021  ? Foraminal stenosis of cervical region 01/23/2021  ? Trichomonal infection 12/17/2020  ? Herniated cervical disc 09/28/2020  ? 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 ? ?REFERRING DIAG: W09.811 (ICD-10-CM) - S/P total left hip arthroplasty ? ?THERAPY DIAG:  ?Pain in left hip ? ?Muscle weakness (generalized) ? ?Difficulty in walking, not elsewhere classified ? ?PERTINENT HISTORY: S/p nephrectomy 2003, Trichomonal infection. Foraminal stenosis of cervical region( receiving cortisone injections, chronic Left hip pain, OA of both hips, DDD, herniated Cervical  disc ? ?PRECAUTIONS: Anterior Hip Precautians ?WEIGHT BEARING RESTRICTIONS Yes WBAT on L ? ?FALLS:  ?Has patient fallen in last 6 months? Yes. Number of falls 3  fell before surgery but now feel like I am tripping up sometimes because of my L knee and my ankle ? ?SUBJECTIVE: No transportation on Tuesday so missed my appt. Saw MD and he said my healing was slow in the hip. He gave me predisone for my back and some more pain meds for the hip. I have been walking around the house. The last couple of days have been good.  ? ? ?PAIN: ?Are you having pain? Are you having pain? Yes: NPRS scale: 5/10 pain  ?Pain location: left lateral hip  ?Pain description: sharp ?Aggravating factors: hitting knee on tub   ?Relieving factors: propping my feet up, taking muscle relaxers and pain medication, ice ? ? ? ? ?  ?OBJECTIVE:  ?  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 Dedicated CT or MRI ?could confirm. ?08-18-21 IMPRESSION: POST OA dx ?No evidence of left hip arthroplasty complication. ?  ?PATIENT SURVEYS:  ?LEFS 10/80 ?  ?COGNITION: ?          Overall cognitive status: Within functional limits for tasks assessed               ?           ?  SENSATION: ?Light touch: Impaired  numbness over incision ?  ?MUSCLE LENGTH: ?Hamstrings: Right 60 deg; Left 50 degmust have PT assist on L ?Thomas test: NT post surgery ?  ?POSTURE:  ?Left hip level lower than R ?  ?PALPATION: ?Well healed anterior hip scar for THA on L, TTP over Left gluteals down ITB and piriformis ?  ?LE ROM: ?  ?Active ROM Right ?10/13/2021 Left ?10/13/2021 Left  ?10/28/21  ?Hip flexion 90 60 100 AROM  ?Hip extension 30 5   ?Hip abduction 38 20   ?Hip adduction       ?Hip internal rotation       ?Hip external rotation       ?Knee flexion 135/140 [id number]   ?Knee extension 0 0   ?Ankle dorsiflexion       ?Ankle plantarflexion       ?Ankle inversion       ?Ankle eversion       ? (Blank rows = not tested) ?  ?LE MMT:    unable to perform  SLR with Left leg 10-13-21 ?  ?MMT Right ?10/13/2021 Left ?10/13/2021  ?Hip flexion 4- 3-  ?Hip extension 4- 3-  ?Hip abduction 4- 2+  ?Hip adduction      ?Hip internal rotation 4- 3-  ?Hip external rotation 4- 3-  ?Knee flexion 4+ 4  ?Knee extension 4 4  ?Ankle dorsiflexion      ?Ankle plantarflexion      ?Ankle inversion      ?Ankle eversion      ? (Blank rows = not tested) ?  ?LOWER EXTREMITY SPECIAL TESTS:  ?NT due to post L anterior THA ?  ?FUNCTIONAL TESTS:  ?5 times sit to stand: 32 sec  pain and must use hands  10-26-21 15.52 sec ?6 minute walk test: 11/06/21:629 feet  ?  ?GAIT: ?Distance walked: 120 feet ?Assistive device utilized: Single point cane ?Level of assistance: Complete Independence ?Comments: Pt with moderate difficulty and antalgic gait due to Left LE weakness and pain at 8/10.  Pt did negotiate steps with step to gait and use of cane and handrail ?  ?  ?  ?TOD[facility]'S TREATMENT: [facility]                                                [facility]: 11[facility]06/21 ?Therapeutic Exercise: ?Nustep L4 LE only x 6 min ?6MWT 629 feet (4 standing rest breaks)  ?Seated alternating march ?Seated alternating LAQ  ?Seated ball squeezes ?Step ups on 8 in step 2 x 10 on R and L ?Bilat heel raises  ?Cybex hip abduction 12.5 # 1 x 10 ?Cybex hip extension 12.5# 1x 10, 25# x 10  ?Sit to stand  15# 1 x 10- standard chair  ? ? ?[facility]                                                DATE: 10/28/21 ?Therapeutic Exercise: ?Nustep L2 LE only x 6 min ?Alteranting March x 10 ?Bilat heel raise  ?Small runners stretch 10 sec x 3 each  ?Seated LAQ x 20 Left ?Small ROM hip flexor dangle ankle off side  ?Bridge x 10  ?Supine AROM  clam ?Hooklying pelvic rocking- anterior and posterior.  ?Hamstring stretch with strap and PTA assist ?Passive hip abduction stretch 10 sec x 3  ?QS into towel 5 sec x 10  ?Small ROM LTR  ? ? ? ? ?[facility] Adult PT Treatment:                                                DATE: 10-26-21 ?Therapeutic  Exercise: ?Nustep 6 min UE/LE level 3 ?Step ups on 8 in step 2 x 10 on R and L ?Cybex hip abduction 12.5 # 1 x 10, 25 # 2 x 10 both left, then Right side 25# 2 x 10 ?Cybex hip extension 25 # 2 x 10 on R and then L ?Sit to stand  25 # 1 x 10 2 min rest  then 1 x 10 ?Left heel shin to knee 1 x 10 with help by PT for 5 additional for increased range of motion ?Sitting resisted hip flexion with GTB 2 x 10 ?Deadlift with elevated with box 14 in with 25 # and pt facing away from box for decreased challenge 2 x 10 ? ?GAIT- walking without cane in front of mirror, cue for heel toe in ll bars without cane ?Heel lift in left shoes 1/8th inch for LLD ?Side stepping and backward stepping ?Forward stepping with mirror ? ?[facility] Adult PT Treatment:                                                DATE: 10-22-21 ? ?Therapeutic Exercise: ?Sitting hip flexion with RTB 30 x with 3 sec hold ?Attempted standing hip flexion resisted and pt internally rotates Left hip and DC until stronger ?Educated on [employer] and added to HEP ?PT assists with eccentric SLR 2 x 10 ?Hip abduction 2 x 10 on L with left socked foot on bolster for guide for alignment to simulate wall at home ?Supine bent knee fall out 2 x 10 on R and L ?Heel raise bil 2 x 10 ?Sit to stand from chair 16 x to fatigue ? ?GAIT ? Pt enters clinic with antalgic gait and utilizing collapsible cane that she fell with.  Pt told to get a more sturdy cane and utilized sturdy cane in provided  by clinic.  ? ? ?[facility] Adult PT Treatment:                                                DATE: 10-20-21 ?Therapeutic Exercise: ?Heel raise bil 2 x 10 ?Heel raise on R x 10 ?Heel raise on L unable initially and then slowly to 9 ?Standing Hip abduction with UE support 2 x 10 on R and L ?Standing Hip extension with UE support 2 x 10 R and L ?Standing marching 1 x 10 R and L ?Sit to stand with VC for even wt distribution 2 x 10 ?LAQ with 2 x 10 R and L ?SAQ 1 x 10 R and L ?Supine heel slides 2 x 10 Left  and R ?Supine abduction 15 x on L ?Supine bent knee fall out 2  x 10 on R and L ?Sidelyng R for left hip abduction 2 x 10 with VC and TC from PT ?GAIT- pt gait trained for 200 feet with use of solid cane with

## 2021-11-10 ENCOUNTER — Ambulatory Visit: Payer: Medicaid Other | Admitting: Neurology

## 2021-11-10 ENCOUNTER — Encounter: Payer: Self-pay | Admitting: Physical Therapy

## 2021-11-10 ENCOUNTER — Ambulatory Visit: Payer: Medicaid Other | Attending: Family Medicine | Admitting: Physical Therapy

## 2021-11-10 VITALS — BP 136/82 | HR 66 | Ht 64.0 in | Wt 178.0 lb

## 2021-11-10 DIAGNOSIS — G4700 Insomnia, unspecified: Secondary | ICD-10-CM

## 2021-11-10 DIAGNOSIS — M25552 Pain in left hip: Secondary | ICD-10-CM

## 2021-11-10 DIAGNOSIS — R262 Difficulty in walking, not elsewhere classified: Secondary | ICD-10-CM

## 2021-11-10 DIAGNOSIS — R252 Cramp and spasm: Secondary | ICD-10-CM

## 2021-11-10 DIAGNOSIS — M6281 Muscle weakness (generalized): Secondary | ICD-10-CM

## 2021-11-10 DIAGNOSIS — M542 Cervicalgia: Secondary | ICD-10-CM | POA: Diagnosis present

## 2021-11-10 DIAGNOSIS — F419 Anxiety disorder, unspecified: Secondary | ICD-10-CM

## 2021-11-10 DIAGNOSIS — G43009 Migraine without aura, not intractable, without status migrainosus: Secondary | ICD-10-CM | POA: Diagnosis not present

## 2021-11-10 MED ORDER — SUMATRIPTAN SUCCINATE 50 MG PO TABS: 50.0000 mg | ORAL_TABLET | ORAL | 3 refills | Status: DC | PRN

## 2021-11-10 NOTE — Patient Instructions (Signed)
Continue duloxetine, refer to psychiatry for management of her depressive symptoms ?Continue with sumatriptan as needed for headaches.  If the headaches frequency increase, will consider starting another preventive medication. ?Follow-up with 63-months ?

## 2021-11-10 NOTE — Progress Notes (Signed)
? ?[facility] ? ?PATIENT: Kristin Montes ?DOB: 08/04/1975 ? ?REFERRING CLINICIAN: Georganna Skeans, MD ?HISTORY FROM: Patient  ?REASON FOR VISIT: Headaches  ? ? ?HISTORICAL ? ?CHIEF COMPLAINT:  ?Chief Complaint  ?Patient presents with  ? Follow-up  ?  Rm 13,  ?Pt c/o balance issues, states her headaches have improved   ? ? ?INTERVAL HISTORY 11/10/2021:  ?Patient presented for follow-up, at last visit in December she reported improvement of the headaches and sleep, at that time plan was to continue with amitriptyline and sumatriptan as needed for headache.  In the meantime patient did have hip surgery and during that time the amitriptyline was discontinued and patient was put on Lyrica.  When Lyrica was completed patient was not put back on amitriptyline.  He started having depressive symptoms, crying, follow-up with her primary care doctor who started her on duloxetine.  Since then she is reporting that her depressive symptoms are much better but she is having episode of severe headaches almost every 2 weeks and they can last up to 3 days associated with nausea, photophobia and phonophobia.  During this time she is unable to function.  Nothing seems to help.  Her last headache.  Was last week. ? ? ?INTERVAL HISTORY 06/24/2021: ?Patient presents today for follow-up, at last visit she was started on amitriptyline and sumatriptan as needed for headaches.  States her headaches are better but she still having neck pain.  She did have a ESI done and had relief for about 15 days.  Her cervical type pain is located in the back of her neck.  Stated that she has been referred to a spine doctor, she has not seen a the surgeon yet.  She is also scheduled for a hip replacement in February.  She had increased her amitriptyline to 50 mg nightly, will continue the same dose.  I also advised her that that the sumatriptan may not be beneficial for cervicalgia type pain, she can continue with the Aleve/Motrin and other  muscle relaxant.  I will see her in 6 months for follow-up.   ? ? ?HISTORY OF PRESENT ILLNESS:  ?This is a 46 year old woman with past medical history of cervicalgia who is presenting for new onset headache.  Patient stated that she said she has been having terrible headache started for the past 5 months.  Patient describes headache as sharp pain located on the top of the head, she has tried Tylenol and Aleve and is not working at this moment.  Sometimes the headache can be so painful that it will bring tears to my eye, on average she will have 5 headache days per week and the headache can last the whole day.  She reported this pain is totally different from cervical hip pain she is being followed by pain management and she is scheduled to have a ESI next week.  She denies any family history of migraines, denies any photophobia or phonophobia, no nausea no vomiting.  However she does say sometimes with a headache she may have slight blurred vision ? ? ?Headache History and Characteristics: ?Onset: 5 months ago ?Location: Top of head ?Quality: Lambert Mody  ?Intensity: 10/10.  ?Duration: Can last the whole day ?Migrainous Features: None   ?Aura: No  ?History of brain injury or tumor: No ? ?Family history: None  ?Motion sickness: no ?Cardiac history: no ? ?OTC: tylenol, aleve  ?Caffeine: coffee ?Sleep: Not good, getting 4 hrs, will wake up in the middle of the night and unable to  fall back asleep  ?Mood/ Stress: Very stress because of her cervical pain and she is out of work since March  ? ?Prior prophylaxis: ?Propranolol: No  ?Verapamil:No ?TCA: No ?Topamax: No ?Depakote: No ?Effexor: No ?Cymbalta: No ?Neurontin:No ? ?Prior abortives: ?Triptan: No ?Anti-emetic: No ?Steroids: No ?Ergotamine suppository: No ? ?Prior interventions: None  ? ?OTHER MEDICAL CONDITIONS: Cervicalgia  ? ? ?REVIEW OF SYSTEMS: Full 14 system review of systems performed and negative with exception of: as noted in the HPI ? ?ALLERGIES: ?No Known  Allergies ? ?HOME MEDICATIONS: ?Outpatient Medications Prior to Visit  ?Medication Sig Dispense Refill  ? cyclobenzaprine (FLEXERIL) 10 MG tablet cyclobenzaprine 10 mg tablet ? Take 1 tablet 3 times a day by oral route.    ? docusate sodium (COLACE) 100 MG capsule Take 1 capsule (100 mg total) by mouth 2 (two) times daily. 10 capsule 0  ? DULoxetine (CYMBALTA) 30 MG capsule Take 1 capsule (30 mg total) by mouth 2 (two) times daily. 180 capsule 0  ? HYDROcodone-acetaminophen (NORCO/VICODIN) 5-325 MG tablet every 12 (twelve) hours.    ? predniSONE (STERAPRED UNI-PAK 21 TAB) 5 MG (21) TBPK tablet Take by mouth.    ? ?No facility-administered medications prior to visit.  ? ? ?PAST MEDICAL HISTORY: ?Past Medical History:  ?Diagnosis Date  ? Arthritis   ? Complication of anesthesia   ? slow to wake up with nephrectomy  ? Depression   ? Headache   ? History of kidney stones   ? Pneumonia   ? ? ?PAST SURGICAL HISTORY: ?Past Surgical History:  ?Procedure Laterality Date  ? HERNIA REPAIR    ? NEPHRECTOMY    ? TOTAL HIP ARTHROPLASTY Left 08/18/2021  ? Procedure: TOTAL HIP ARTHROPLASTY ANTERIOR APPROACH;  Surgeon: Durene Romanslin, Matthew, MD;  Location: [facility];  Service: Orthopedics;  Laterality: Left;  ? TUBAL LIGATION    ? ? ?FAMILY HISTORY: ?No family history on file. ? ?SOCIAL HISTORY: ?Social History  ? ?Socioeconomic History  ? Marital status: Single  ?  Spouse name: Not on file  ? Number of children: Not on file  ? Years of education: Not on file  ? Highest education level: Not on file  ?Occupational History  ? Not on file  ?Tobacco Use  ? Smoking status: Every Day  ?  Packs/day: 0.25  ?  Types: Cigarettes  ? Smokeless tobacco: Never  ? Tobacco comments:  ?  6 cigs/day  ?Vaping Use  ? Vaping Use: Never used  ?Substance and Sexual Activity  ? Alcohol use: Not Currently  ? 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 Sexual activity: Not on file  ?Other Topics Concern  ? Not on file  ?Social History Narrative  ? Not on  file  ? ?Social Determinants of Health  ? ?Financial Resource Strain: Not on file  ?Food Insecurity: Not on file  ?Transportation Needs: Not on file  ?Physical Activity: Not on file  ?Stress: Not on file  ?Social Connections: Not on file  ?Intimate Partner Violence: Not on file  ? ? ? ?PHYSICAL EXAM ? ?GENERAL EXAM/CONSTITUTIONAL: ?Vitals:  ?Vitals:  ? 11/10/21 1351  ?BP: 136/82  ?Pulse: 66  ?Weight: 178 lb (80.7 kg)  ?Height: 5\' 4"  (1.626 m)  ? ?Body mass index is 30.55 kg/m?. ?Wt Readings from Last 3 Encounters:  ?11/10/21 178 lb (80.7 kg)  ?11/05/21 165 lb 3.2 oz (74.9 kg)  ?10/05/21 168 lb 9.6 oz (76.5 kg)  ? ?  Patient is in no distress; well developed, nourished and groomed; neck is supple ? ?EYES: ?Pupils round and reactive to light, Visual fields full to confrontation, Extraocular movements intacts,  ? ?MUSCULOSKELETAL: ?Gait, strength, tone, movements noted in Neurologic exam below ? ?NEUROLOGIC: ?MENTAL STATUS:  ?awake, alert, oriented to person, place and time ?recent and remote memory intact ?normal attention and concentration ?language fluent, comprehension intact, naming intact ?fund of knowledge appropriate ? ?CRANIAL NERVE:  ?2nd, 3rd, 4th, 6th - pupils equal and reactive to light, visual fields full to confrontation, extraocular muscles intact, no nystagmus ?5th - facial sensation symmetric ?7th - facial strength symmetric ?8th - hearing intact ?9th - palate elevates symmetrically, uvula midline ?11th - shoulder shrug symmetric ?12th - tongue protrusion midline ? ?MOTOR:  ?normal bulk and tone, full strength in the BUE, BLE ? ?SENSORY:  ?normal and symmetric to light touch, pinprick, temperature, vibration ? ?COORDINATION:  ?finger-nose-finger, fine finger movements normal ? ?REFLEXES:  ?deep tendon reflexes present and symmetric ? ?GAIT/STATION:  ?normal ? ? ?DIAGNOSTIC DATA (LABS, IMAGING, TESTING) ?- I reviewed patient records, labs, notes, testing and imaging myself where available. ? ?Lab Results   ?Component Value Date  ? WBC 9.3 08/19/2021  ? HGB 10.9 (L) 08/19/2021  ? HCT 33.1 (L) 08/19/2021  ? MCV 96.2 08/19/2021  ? PLT 199 08/19/2021  ? ?   ?Component Value Date/Time  ? NA 138 08/19/2021 0306  ? K

## 2021-11-10 NOTE — Therapy (Signed)
?OUTPATIENT PHYSICAL THERAPY TREATMENT NOTE ? ? ?Patient Name: Kristin Montes ?MRN: [id number] ?DOB:Nov 22, 1975, 46 y.o., female ?Today's Date: 11/10/2021 ? ?PCP: Georganna Skeans, MD ?REFERRING PROVIDER: Georganna Skeans, MD ? ?END OF SESSION:  ? PT End of Session - 11/10/21 1101   ? ? Visit Number 7   ? Number of Visits 16   ? Date for PT Re-Evaluation 02/02/22   ? Authorization Type [employer] No vaso, ionto, estim or traction   ? Authorization Time Period 10/14/21-11/28/21, 12 visits   ? Authorization - Visit Number 7   ? Authorization - Number of Visits 12   ? PT Start Time 1102   ? PT Stop Time 1145   ? PT Time Calculation (min) 43 min   ? Activity Tolerance Patient tolerated treatment well   ? Behavior During Therapy [facility] for tasks assessed/performed   ? ?  ?  ? ?  ? ? ? ? ? ?Past Medical History:  ?Diagnosis Date  ? Arthritis   ? Complication of anesthesia   ? slow to wake up with nephrectomy  ? Depression   ? Headache   ? History of kidney stones   ? Pneumonia   ? ?Past Surgical History:  ?Procedure Laterality Date  ? HERNIA REPAIR    ? NEPHRECTOMY    ? TOTAL HIP ARTHROPLASTY Left 08/18/2021  ? Procedure: TOTAL HIP ARTHROPLASTY ANTERIOR APPROACH;  Surgeon: Durene Romans, MD;  Location: [facility];  Service: Orthopedics;  Laterality: Left;  ? TUBAL LIGATION    ? ?Patient Active Problem List  ? Diagnosis Date Noted  ? S/P total left hip arthroplasty 08/18/2021  ? DDD (degenerative disc disease), cervical 07/28/2021  ? Osteoarthritis of hip 06/13/2021  ? Chronic left hip pain 05/19/2021  ? Foraminal stenosis of cervical region 01/23/2021  ? Trichomonal infection 12/17/2020  ? Herniated cervical disc 09/28/2020  ? 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 ? ?REFERRING DIAG: D40.814 (ICD-10-CM) - S/P total left hip arthroplasty ? ?THERAPY DIAG:  ?Pain in left hip ? ?Muscle weakness (generalized) ? ?Difficulty in walking, not elsewhere classified ? ?Cervicalgia ? ?Cramp and spasm ? ?PERTINENT HISTORY: S/p nephrectomy 2003, Trichomonal  infection. Foraminal stenosis of cervical region( receiving cortisone injections, chronic Left hip pain, OA of both hips, DDD, herniated Cervical disc ? ?PRECAUTIONS: Anterior Hip Precautians ?WEIGHT BEARING RESTRICTIONS Yes WBAT on L ? ?FALLS:  ?Has patient fallen in last 6 months? Yes. Number of falls 3  fell before surgery but now feel like I am tripping up sometimes because of my L knee and my ankle ? ?SUBJECTIVE: No transportation on Tuesday so missed my appt. Saw MD and he said my healing was slow in the hip. He gave me predisone for my back and some more pain meds for the hip. I have been walking around the house. The last couple of days have been good.  ? ? ?PAIN: ? ?11-10-21 ?Are you having pain? Are you having pain? Yes: NPRS scale: 6/10 pain  ?Pain location: left lateral hip  ?Pain description: sharp ?Aggravating factors: hitting knee on tub  sharp pain ?Relieving factors: propping my feet up, taking muscle relaxers and pain medication, ice ? ? ?Are you having pain? Are you having pain? Yes: NPRS scale: 5/10 pain  ?Pain location: left lateral hip  ?Pain description: sharp ?Aggravating factors: hitting knee on tub   ?Relieving factors: propping my feet up, taking muscle relaxers and pain medication, ice ? ? ? ? ?  ?OBJECTIVE:  ?  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 Dedicated CT or MRI ?could confirm. ?08-18-21 IMPRESSION: POST OA dx ?No evidence of left hip arthroplasty complication. ?  ?PATIENT SURVEYS:  ?LEFS 10/80 ?11-10-21 48/80 ?  ?COGNITION: ?          Overall cognitive status: Within functional limits for tasks assessed               ?           ?SENSATION: ?Light touch: Impaired  numbness over incision ?  ?MUSCLE LENGTH: ?Hamstrings: Right 60 deg; Left 50 degmust have PT assist on L ?Thomas test: NT post surgery ?  ?POSTURE:  ?Left hip level lower than R ?  ?PALPATION: ?Well healed anterior hip scar for THA on L, TTP over Left gluteals down ITB and  piriformis ?  ?LE ROM: ?  ?Active ROM Right ?10/13/2021 Left ?10/13/2021 Left  ?10/28/21  ?Hip flexion 90 60 100 AROM  ?Hip extension 30 5   ?Hip abduction 38 20   ?Hip adduction       ?Hip internal rotation       ?Hip external rotation       ?Knee flexion 135/140 137/141   ?Knee extension 0 0   ?Ankle dorsiflexion       ?Ankle plantarflexion       ?Ankle inversion       ?Ankle eversion       ? (Blank rows = not tested) ?  ?LE MMT:    unable to perform SLR with Left leg 10-13-21 ?  ?MMT Right ?10/13/2021 Left ?10/13/2021  ?Hip flexion 4- 3-  ?Hip extension 4- 3-  ?Hip abduction 4- 2+  ?Hip adduction      ?Hip internal rotation 4- 3-  ?Hip external rotation 4- 3-  ?Knee flexion 4+ 4  ?Knee extension 4 4  ?Ankle dorsiflexion      ?Ankle plantarflexion      ?Ankle inversion      ?Ankle eversion      ? (Blank rows = not tested) ?  ?LOWER EXTREMITY SPECIAL TESTS:  ?NT due to post L anterior THA ?  ?FUNCTIONAL TESTS:  ?5 times sit to stand: 32 sec  pain and must use hands  10-26-21 15.52 sec ?6 minute walk test: 11/06/21:629 feet    ?6 MWT 986 ft without cane ?  ?GAIT: ?Distance walked: 120 feet ?Assistive device utilized: Single point cane ?Level of assistance: Complete Independence ?Comments: Pt with moderate difficulty and antalgic gait due to Left LE weakness and pain at 8/10.  Pt did negotiate steps with step to gait and use of cane and handrail ?  ?  ?  ?TODAY'S TREATMENT: ?[facility] Adult PT Treatment:                                                DATE: 11-10-21 ?11-10-21 48/80 ?11-10-21 6 MWT 986 ft without cane ?  ?Therapeutic Exercise: ?6 MWT 986 ft without cane with VC for proper gait sequence during walking without cane ?Monster walk 60 feet x 3 with UE support by PT RTB around feet ?Step ups on 8 in step 2 x 10 on R and L ?Sit to stand  25# 1 x 10 then 2 min rest then 1 x 10 - standard chair  ?Bridge with ball squeeze 3 x 10 ?SLR  2 x 8,  1 x 4 with 3 lb weight ?Standing hip flexor with RTB 3 x 10 ?Bilat heel raises 2 x 15 ?Standing  hip abd with RTB 3 x 10 r and L ?Standing hip ext with RTB 3 x 10 R and L ?Manual Therapy: ?Scar tissue massage ?IASTYM over Left quad, glut med and scar tissue ? ? ?[facility] Adult PT Treatment:                                                DATE: 11/06/21 ?Therapeutic Exercise: ?Nustep L4 LE only x 6 min ? 629 feet (4 standing rest breaks)  ?Seated alternating march ?Seated alternating LAQ  ?Seated ball squeezes ?Step ups on 8 in step 2 x 10 on R and L ?Bilat heel raises  ?Cybex hip abduction 12.5 # 1 x 10 ?Cybex hip extension 12.5# 1x 10, 25# x 10  ?Sit to stand  15# 1 x 10- standard chair  ? ? ?[facility] Adult PT Treatment:                                                DATE: 10/28/21 ?Therapeutic Exercise: ?Nustep L2 LE only x 6 min ?Alteranting March x 10 ?Bilat heel raise  ?Small runners stretch 10 sec x 3 each  ?Seated LAQ x 20 Left ?Small ROM hip flexor dangle ankle off side  ?Bridge x 10  ?Supine AROM clam ?Hooklying pelvic rocking- anterior and posterior.  ?Hamstring stretch with strap and PTA assist ?Passive hip abduction stretch 10 sec x 3  ?QS into towel 5 sec x 10  ?Small ROM LTR  ? ? ? ? ?[facility] Adult PT Treatment:                                                DATE: 10-26-21 ?Therapeutic Exercise: ?Nustep 6 min UE/LE level 3 ?Step ups on 8 in step 2 x 10 on R and L ?Cybex hip abduction 12.5 # 1 x 10, 25 # 2 x 10 both left, then Right side 25# 2 x 10 ?Cybex hip extension 25 # 2 x 10 on R and then L ?Sit to stand  25 # 1 x 10 2 min rest  then 1 x 10 ?Left heel shin to knee 1 x 10 with help by PT for 5 additional for increased range of motion ?Sitting resisted hip flexion with GTB 2 x 10 ?Deadlift with elevated with box 14 in with 25 # and pt facing away from box for decreased challenge 2 x 10 ? ?GAIT- walking without cane in front of mirror, cue for heel toe in ll bars without cane ?Heel lift in left shoes 1/8th inch for LLD ?Side stepping and backward stepping ?Forward stepping with mirror ? ?[facility] Adult PT  Treatment:                                                DATE: 10-22-21 ? ?  Therapeutic Exercise: ?Sitting hip flexion with RTB 30 x with 3 sec hold ?Attempted standing hip flexion resisted and pt internally r

## 2021-11-11 ENCOUNTER — Telehealth: Payer: Self-pay | Admitting: Neurology

## 2021-11-11 NOTE — Telephone Encounter (Signed)
Referral for Psychology sent to [facility] [phone]. ?

## 2021-11-17 NOTE — Telephone Encounter (Signed)
Patient refused due to the cost [facility] is out of network.  ?

## 2021-11-18 ENCOUNTER — Encounter: Payer: Medicaid Other | Admitting: Physical Therapy

## 2021-11-18 DIAGNOSIS — Z0271 Encounter for disability determination: Secondary | ICD-10-CM

## 2021-11-19 ENCOUNTER — Encounter: Payer: Self-pay | Admitting: Physical Therapy

## 2021-11-19 ENCOUNTER — Ambulatory Visit: Payer: Medicaid Other | Admitting: Physical Therapy

## 2021-11-19 DIAGNOSIS — M6281 Muscle weakness (generalized): Secondary | ICD-10-CM

## 2021-11-19 DIAGNOSIS — R262 Difficulty in walking, not elsewhere classified: Secondary | ICD-10-CM

## 2021-11-19 DIAGNOSIS — M25552 Pain in left hip: Secondary | ICD-10-CM | POA: Diagnosis not present

## 2021-11-19 NOTE — Therapy (Addendum)
?OUTPATIENT PHYSICAL THERAPY TREATMENT NOTE ? ? ?Patient Name: Kristin Montes ?MRN: [id number] ?DOB:[birth year], [age] y.[name], female ?Today's Date: 11/19/2021 ? ?PCP: Georganna Montes, Amelia, MD ?REFERRING PROVIDER: Georganna Montes, Amelia, MD ? ?END OF SESSION:  ? PT End of Session - 11/19/21 1108   ? ? Visit Number 8   ? Number of Visits 16   ? Date for PT Re-Evaluation 02/02/22   ? Authorization Type [employer] [facility] No vaso, ionto, estim or traction   ? Authorization Time Period 10/14/21-11/28/21, 12 visits   ? Authorization - Visit Number 8   ? Authorization - Number of Visits 12   ? PT Start Time 1103   ? PT Stop Time 1144   ? PT Time Calculation (min) 41 min   ? ?  ?  ? ?  ? ? ? ? ? ?Past Medical History:  ?Diagnosis Date  ? Arthritis   ? Complication of anesthesia   ? slow to wake up with nephrectomy  ? Depression   ? Headache   ? History of kidney stones   ? Pneumonia   ? ?Past Surgical History:  ?Procedure Laterality Date  ? HERNIA REPAIR    ? NEPHRECTOMY    ? TOTAL HIP ARTHROPLASTY Left 08/18/2021  ? Procedure: TOTAL HIP ARTHROPLASTY ANTERIOR APPROACH;  Surgeon: Durene Romanslin, Matthew, MD;  Location: [facility];  Service: Orthopedics;  Laterality: Left;  ? TUBAL LIGATION    ? ?Patient Active Problem List  ? Diagnosis Date Noted  ? S/P total left hip arthroplasty 08/18/2021  ? DDD (degenerative disc disease), cervical 07/28/2021  ? Osteoarthritis of hip 06/13/2021  ? Chronic left hip pain 05/19/2021  ? Foraminal stenosis of cervical region 01/23/2021  ? Trichomonal infection 12/17/2020  ? Herniated cervical disc 09/28/2020  ? 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 ? ?REFERRING DIAG: W09.811Z96.642 (ICD-10-CM) - S/P total left hip arthroplasty ? ?THERAPY DIAG:  ?Pain in left hip ? ?Muscle weakness (generalized) ? ?Difficulty in walking, not elsewhere classified ? ?PERTINENT HISTORY: S/p nephrectomy 2003, Trichomonal infection. Foraminal stenosis of cervical region( receiving cortisone injections, chronic Left hip pain, OA of both hips, DDD, herniated Cervical  disc ? ?PRECAUTIONS: Anterior Hip Precautians ?WEIGHT BEARING RESTRICTIONS Yes WBAT on L ? ?FALLS:  ?Has patient fallen in last 6 months? Yes. Number of falls 3  fell before surgery but now feel like I am tripping up sometimes because of my L knee and my ankle ? ?SUBJECTIVE: No transportation on Tuesday so missed my appt. Saw MD and he said my healing was slow in the hip. He gave me predisone for my back and some more pain meds for the hip. I have been walking around the house. The last couple of days have been good.  ? ? ?PAIN: ? ?11-10-21 ?Are you having pain? Are you having pain? Yes: NPRS scale: 6/10 pain  ?Pain location: left lateral hip  ?Pain description: sharp ?Aggravating factors: hitting knee on tub  sharp pain ?Relieving factors: propping my feet up, taking muscle relaxers and pain medication, ice ? ? ?Are you having pain? Are you having pain? Yes: NPRS scale: 5/10 pain  ?Pain location: left lateral hip  ?Pain description: sharp ?Aggravating factors: hitting knee on tub   ?Relieving factors: propping my feet up, taking muscle relaxers and pain medication, ice ? ? ? ? ?  ?OBJECTIVE:  ?  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 Dedicated CT or MRI ?could confirm. ?08-18-21 IMPRESSION: POST OA dx ?No evidence of left hip arthroplasty  complication. ?  ?PATIENT SURVEYS:  ?LEFS 10/80 ?11-10-21 48/80 ?  ?COGNITION: ?          Overall cognitive status: Within functional limits for tasks assessed               ?           ?SENSATION: ?Light touch: Impaired  numbness over incision ?  ?MUSCLE LENGTH: ?Hamstrings: Right 60 deg; Left 50 degmust have PT assist on L ?Thomas test: NT post surgery ?  ?POSTURE:  ?Left hip level lower than R ?  ?PALPATION: ?Well healed anterior hip scar for THA on L, TTP over Left gluteals down ITB and piriformis ?  ?LE ROM: ?  ?Active ROM Right ?10/13/2021 Left ?10/13/2021 Left  ?10/28/21  ?Hip flexion 90 60 100 AROM  ?Hip extension 30 5   ?Hip  abduction 38 20   ?Hip adduction       ?Hip internal rotation       ?Hip external rotation       ?Knee flexion 135/140 137/141   ?Knee extension 0 0   ?Ankle dorsiflexion       ?Ankle plantarflexion       ?Ankle inversion       ?Ankle eversion       ? (Blank rows = not tested) ?  ?LE MMT:    unable to perform SLR with Left leg 10-13-21 ?  ?MMT Right ?10/13/2021 Left ?10/13/2021  ?Hip flexion 4- 3-  ?Hip extension 4- 3-  ?Hip abduction 4- 2+  ?Hip adduction      ?Hip internal rotation 4- 3-  ?Hip external rotation 4- 3-  ?Knee flexion 4+ 4  ?Knee extension 4 4  ?Ankle dorsiflexion      ?Ankle plantarflexion      ?Ankle inversion      ?Ankle eversion      ? (Blank rows = not tested) ?  ?LOWER EXTREMITY SPECIAL TESTS:  ?NT due to post L anterior THA ?  ?FUNCTIONAL TESTS:  ?5 times sit to stand: 32 sec  pain and must use hands  10-26-21 15.52 sec ?6 minute walk test: 11/06/21:629 feet    ?6 MWT 986 ft without cane ?  ?GAIT: ?Distance walked: 120 feet ?Assistive device utilized: Single point cane ?Level of assistance: Complete Independence ?Comments: Pt with moderate difficulty and antalgic gait due to Left LE weakness and pain at 8/10.  Pt did negotiate steps with step to gait and use of cane and handrail ?  ?  ?  ?TODAY'S TREATMENT: ?[facility] Adult PT Treatment:                                                DATE: 11/19/21 ?Therapeutic Exercise: ?Nustep L4 LE only x 6 min ?4 inch step up x 10  left ?4 inch lateral step up left  ?Standing 2 way hip red band 10 x 2 each ?Side stepping red band at ankles 15 feet x 2 each way  ?Sit-stand 25# 10 x 2  ?Seated left LAQ 5# x 20 ?Hip flexor gentle stretch edge of mat 30 sec x 3 ? ?[facility] Adult PT Treatment:  DATE: 11-10-21 ?11-10-21 48/80 ?11-10-21 6 MWT 986 ft without cane ?  ?Therapeutic Exercise: ?6 MWT 986 ft without cane with VC for proper gait sequence during walking without cane ?Monster walk 60 feet x 3 with UE support by PT RTB around  feet ?Step ups on 8 in step 2 x 10 on R and L ?Sit to stand  25# 1 x 10 then 2 min rest then 1 x 10 - standard chair  ?Bridge with ball squeeze 3 x 10 ?SLR 2 x 8,  1 x 4 with 3 lb weight ?Standing hip flexor with RTB 3 x 10 ?Bilat heel raises 2 x 15 ?Standing hip abd with RTB 3 x 10 r and L ?Standing hip ext with RTB 3 x 10 R and L ?Manual Therapy: ?Scar tissue massage ?IASTYM over Left quad, glut med and scar tissue ? ? ?[facility] Adult PT Treatment:                                                DATE: 11/06/21 ?Therapeutic Exercise: ?Nustep L4 LE only x 6 min ? 629 feet (4 standing rest breaks)  ?Seated alternating march ?Seated alternating LAQ  ?Seated ball squeezes ?Step ups on 8 in step 2 x 10 on R and L ?Bilat heel raises  ?Cybex hip abduction 12.5 # 1 x 10 ?Cybex hip extension 12.5# 1x 10, 25# x 10  ?Sit to stand  15# 1 x 10- standard chair  ? ? ?[facility] Adult PT Treatment:                                                DATE: 10/28/21 ?Therapeutic Exercise: ?Nustep L2 LE only x 6 min ?Alteranting March x 10 ?Bilat heel raise  ?Small runners stretch 10 sec x 3 each  ?Seated LAQ x 20 Left ?Small ROM hip flexor dangle ankle off side  ?Bridge x 10  ?Supine AROM clam ?Hooklying pelvic rocking- anterior and posterior.  ?Hamstring stretch with strap and PTA assist ?Passive hip abduction stretch 10 sec x 3  ?QS into towel 5 sec x 10  ?Small ROM LTR  ? ? ? ? ?[facility] Adult PT Treatment:                                                DATE: 10-26-21 ?Therapeutic Exercise: ?Nustep 6 min UE/LE level 3 ?Step ups on 8 in step 2 x 10 on R and L ?Cybex hip abduction 12.5 # 1 x 10, 25 # 2 x 10 both left, then Right side 25# 2 x 10 ?Cybex hip extension 25 # 2 x 10 on R and then L ?Sit to stand  25 # 1 x 10 2 min rest  then 1 x 10 ?Left heel shin to knee 1 x 10 with help by PT for 5 additional for increased range of motion ?Sitting resisted hip flexion with GTB 2 x 10 ?Deadlift with elevated with box 14 in with 25 # and pt facing away  from box for decreased challenge 2 x 10 ? ?GAIT- walking without cane  in front of mirror, cue for heel toe in ll bars without cane ?Heel lift in left shoes 1/8th inch for LLD ?Side stepping and backward stepping ?Forward stepp

## 2021-11-24 ENCOUNTER — Ambulatory Visit: Payer: Medicaid Other | Admitting: Physical Therapy

## 2021-11-24 ENCOUNTER — Encounter: Payer: Self-pay | Admitting: Physical Therapy

## 2021-11-24 DIAGNOSIS — R262 Difficulty in walking, not elsewhere classified: Secondary | ICD-10-CM

## 2021-11-24 DIAGNOSIS — M6281 Muscle weakness (generalized): Secondary | ICD-10-CM

## 2021-11-24 DIAGNOSIS — M25552 Pain in left hip: Secondary | ICD-10-CM

## 2021-11-24 NOTE — Therapy (Signed)
?OUTPATIENT PHYSICAL THERAPY TREATMENT NOTE ? ? ?Patient Name: Kristin Montes ?MRN: [id number] ?DOB:04/21/1976, 46 y.o., female ?Today's Date: 11/24/2021 ? ?PCP: Georganna Skeans, MD ?REFERRING PROVIDER: Georganna Skeans, MD ? ?END OF SESSION:  ? PT End of Session - 11/24/21 1107   ? ? Visit Number 9   ? Number of Visits 16   ? Date for PT Re-Evaluation 02/02/22   ? Authorization Type [employer] No vaso, ionto, estim or traction   ? Authorization Time Period 10/14/21-11/28/21, 12 visits   ? Authorization - Visit Number 9   ? Authorization - Number of Visits 12   ? PT Start Time 1105   ? PT Stop Time 1145   ? PT Time Calculation (min) 40 min   ? ?  ?  ? ?  ? ? ? ? ? ?Past Medical History:  ?Diagnosis Date  ? Arthritis   ? Complication of anesthesia   ? slow to wake up with nephrectomy  ? Depression   ? Headache   ? History of kidney stones   ? Pneumonia   ? ?Past Surgical History:  ?Procedure Laterality Date  ? HERNIA REPAIR    ? NEPHRECTOMY    ? TOTAL HIP ARTHROPLASTY Left 08/18/2021  ? Procedure: TOTAL HIP ARTHROPLASTY ANTERIOR APPROACH;  Surgeon: Durene Romans, MD;  Location: [facility];  Service: Orthopedics;  Laterality: Left;  ? TUBAL LIGATION    ? ?Patient Active Problem List  ? Diagnosis Date Noted  ? S/P total left hip arthroplasty 08/18/2021  ? DDD (degenerative disc disease), cervical 07/28/2021  ? Osteoarthritis of hip 06/13/2021  ? Chronic left hip pain 05/19/2021  ? Foraminal stenosis of cervical region 01/23/2021  ? Trichomonal infection 12/17/2020  ? Herniated cervical disc 09/28/2020  ? 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 ? ?REFERRING DIAG: G66.599 (ICD-10-CM) - S/P total left hip arthroplasty ? ?THERAPY DIAG:  ?Pain in left hip ? ?Muscle weakness (generalized) ? ?Difficulty in walking, not elsewhere classified ? ?PERTINENT HISTORY: S/p nephrectomy 2003, Trichomonal infection. Foraminal stenosis of cervical region( receiving cortisone injections, chronic Left hip pain, OA of both hips, DDD, herniated Cervical  disc ? ?PRECAUTIONS: Anterior Hip Precautians ?WEIGHT BEARING RESTRICTIONS Yes WBAT on L ? ?FALLS:  ?Has patient fallen in last 6 months? Yes. Number of falls 3  fell before surgery but now feel like I am tripping up sometimes because of my L knee and my ankle ? ?SUBJECTIVE: I am doing okay today. I have not taken anything. I got a cane at the thrift store.  I felt bad when it was raining.  ? ? ?PAIN: ? ?11-10-21 ?Are you having pain? Are you having pain? Yes: NPRS scale: 6/10 pain  ?Pain location: left lateral hip  ?Pain description: sharp ?Aggravating factors: hitting knee on tub  sharp pain ?Relieving factors: propping my feet up, taking muscle relaxers and pain medication, ice ? ? ?Are you having pain? Are you having pain? Yes: NPRS scale: 5/10 pain  ?Pain location: left lateral hip  ?Pain description: sharp ?Aggravating factors: hitting knee on tub   ?Relieving factors: propping my feet up, taking muscle relaxers and pain medication, ice ? ? ? ? ?  ?OBJECTIVE:  ?  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 Dedicated CT or MRI ?could confirm. ?08-18-21 IMPRESSION: POST OA dx ?No evidence of left hip arthroplasty complication. ?  ?PATIENT SURVEYS:  ?LEFS 10/80 ?11-10-21 48/80 ?  ?COGNITION: ?          Overall  cognitive status: Within functional limits for tasks assessed               ?           ?SENSATION: ?Light touch: Impaired  numbness over incision ?  ?MUSCLE LENGTH: ?Hamstrings: Right 60 deg; Left 50 degmust have PT assist on L ?Thomas test: NT post surgery ?  ?POSTURE:  ?Left hip level lower than R ?  ?PALPATION: ?Well healed anterior hip scar for THA on L, TTP over Left gluteals down ITB and piriformis ?  ?LE ROM: ?  ?Active ROM Right ?10/13/2021 Left ?10/13/2021 Left  ?10/28/21  ?Hip flexion 90 60 100 AROM  ?Hip extension 30 5   ?Hip abduction 38 20   ?Hip adduction       ?Hip internal rotation       ?Hip external rotation       ?Knee flexion 135/140 137/141   ?Knee  extension 0 0   ?Ankle dorsiflexion       ?Ankle plantarflexion       ?Ankle inversion       ?Ankle eversion       ? (Blank rows = not tested) ?  ?LE MMT:    unable to perform SLR with Left leg 10-13-21 ?  ?MMT Right ?10/13/2021 Left ?10/13/2021 Left  ?11/24/21  ?Hip flexion 4- 3- 4/5  ?Hip extension 4- 3-   ?Hip abduction 4- 2+ 3+/5  ?Hip adduction       ?Hip internal rotation 4- 3- 4-/5  ?Hip external rotation 4- 3- 4-/5  ?Knee flexion 4+ 4   ?Knee extension 4 4   ?Ankle dorsiflexion       ?Ankle plantarflexion       ?Ankle inversion       ?Ankle eversion       ? (Blank rows = not tested) ?  ?LOWER EXTREMITY SPECIAL TESTS:  ?NT due to post L anterior THA ?  ?FUNCTIONAL TESTS:  ?5 times sit to stand: 32 sec  pain and must use hands  10-26-21 15.52 sec ?6 minute walk test: 11/06/21:629 feet    ?6 MWT 986 ft without cane ?  ?GAIT: ?Distance walked: 120 feet ?Assistive device utilized: Single point cane ?Level of assistance: Complete Independence ?Comments: Pt with moderate difficulty and antalgic gait due to Left LE weakness and pain at 8/10.  Pt did negotiate steps with step to gait and use of cane and handrail ?  ?  ?  ?[facility] TREATMENT[facility]                                                DATE: 11/24/21 ?Therapeutic Exercise: ?Nustep L4 LE only x 6 min ?Bilat heel raises ?6 inch step up x 15  left ?6 inch lateral step up left x15 ?6 inch alternating and unilateral step taps ?Standing 2 way hip red band 10 x 3 each ?Side hip abduction x 10 ?SLR x 10 ?Bridge x 10 ?Sit-stand from bariatric chair x 10 x 2 ? ?[facility]                                                DATE: 11/19/21 ?Therapeutic  Exercise: ?Nustep L4 LE only x 6 min ?4 inch step up x 10  left ?4 inch lateral step up left  ?Standing 2 way hip red band 10 x 2 each ?Side stepping red band at ankles 15 feet x 2 each way  ?Sit-stand 25# 10 x 2  ?Seated left LAQ 5# x 20 ?Hip flexor gentle stretch edge of mat 30 sec x 3 ? ?[facility] Adult PT  Treatment:                                                DATE: 11-10-21 ?11-10-21 48/80 ?11-10-21 6 MWT 986 ft without cane ?  ?Therapeutic Exercise: ?6 MWT 986 ft without cane with VC for proper gait sequence during walking without cane ?Monster walk 60 feet x 3 with UE support by PT RTB around feet ?Step ups on 8 in step 2 x 10 on R and L ?Sit to stand  25# 1 x 10 then 2 min rest then 1 x 10 - standard chair  ?Bridge with ball squeeze 3 x 10 ?SLR 2 x 8,  1 x 4 with 3 lb weight ?Standing hip flexor with RTB 3 x 10 ?Bilat heel raises 2 x 15 ?Standing hip abd with RTB 3 x 10 r and L ?Standing hip ext with RTB 3 x 10 R and L ?Manual Therapy: ?Scar tissue massage ?IASTYM over Left quad, glut med and scar tissue ? ? ?[facility] Adult PT Treatment:                                                DATE: 11/06/21 ?Therapeutic Exercise: ?Nustep L4 LE only x 6 min ? 629 feet (4 standing rest breaks)  ?Seated alternating march ?Seated alternating LAQ  ?Seated ball squeezes ?Step ups on 8 in step 2 x 10 on R and L ?Bilat heel raises  ?Cybex hip abduction 12.5 # 1 x 10 ?Cybex hip extension 12.5# 1x 10, 25# x 10  ?Sit to stand  15# 1 x 10- standard chair  ? ? ?[facility] Adult PT Treatment:                                                DATE: 10/28/21 ?Therapeutic Exercise: ?Nustep L2 LE only x 6 min ?Alteranting March x 10 ?Bilat heel raise  ?Small runners stretch 10 sec x 3 each  ?Seated LAQ x 20 Left ?Small ROM hip flexor dangle ankle off side  ?Bridge x 10  ?Supine AROM clam ?Hooklying pelvic rocking- anterior and posterior.  ?Hamstring stretch with strap and PTA assist ?Passive hip abduction stretch 10 sec x 3  ?QS into towel 5 sec x 10  ?Small ROM LTR  ? ? ? ? ?[facility] Adult PT Treatment:                                                DATE: 10-26-21 ?Therapeutic Exercise: ?Nustep 6 min UE/LE level 3 ?Step  ups on 8 in step 2 x 10 on R and L ?Cybex hip abduction 12.5 # 1 x 10, 25 # 2 x 10 both left, then Right side 25# 2 x 10 ?Cybex hip  extension 25 # 2 x 10 on R and then L ?Sit to stand  25 # 1 x 10 2 min rest  then 1 x 10 ?Left heel shin to knee 1 x 10 with help by PT for 5 additional for increased range of motion ?Sitting resisted hip flexion with

## 2021-11-26 ENCOUNTER — Encounter: Payer: Self-pay | Admitting: Physical Therapy

## 2021-11-26 ENCOUNTER — Ambulatory Visit: Payer: Medicaid Other | Admitting: Physical Therapy

## 2021-11-26 DIAGNOSIS — R262 Difficulty in walking, not elsewhere classified: Secondary | ICD-10-CM

## 2021-11-26 DIAGNOSIS — M6281 Muscle weakness (generalized): Secondary | ICD-10-CM

## 2021-11-26 DIAGNOSIS — M25552 Pain in left hip: Secondary | ICD-10-CM | POA: Diagnosis not present

## 2021-11-26 NOTE — Therapy (Signed)
OUTPATIENT PHYSICAL THERAPY TREATMENT NOTE   Patient Name: Kristin Montes MRN: [id number] DOB:Jul 04, 1976, 46 y.o., female Today's Date: 11/26/2021  PCP: Dorna Mai, MD REFERRING PROVIDER: Dorna Mai, MD  END OF SESSION:   PT End of Session - 11/26/21 1105     Visit Number 10    Number of Visits 16    Date for PT Re-Evaluation 02/02/22    Authorization Type [employer] No vaso, ionto, estim or traction    Authorization Time Period 10/14/21-11/28/21, 12 visits    Authorization - Visit Number 10    Authorization - Number of Visits 12    PT Start Time 1103    PT Stop Time 1145    PT Time Calculation (min) 42 min                Past Medical History:  Diagnosis Date   Arthritis    Complication of anesthesia    slow to wake up with nephrectomy   Depression    Headache    History of kidney stones    Pneumonia    Past Surgical History:  Procedure Laterality Date   HERNIA REPAIR     NEPHRECTOMY     TOTAL HIP ARTHROPLASTY Left 08/18/2021   Procedure: TOTAL HIP ARTHROPLASTY ANTERIOR APPROACH;  Surgeon: Paralee Cancel, MD;  Location: [facility];  Service: Orthopedics;  Laterality: Left;   TUBAL LIGATION     Patient Active Problem List   Diagnosis Date Noted   S/P total left hip arthroplasty 08/18/2021   DDD (degenerative disc disease), cervical 07/28/2021   Osteoarthritis of hip 06/13/2021   Chronic left hip pain 05/19/2021   Foraminal stenosis of cervical region 01/23/2021   Trichomonal infection 12/17/2020   Herniated cervical disc 09/28/2020   S/p nephrectomy 03/16/2013    REFERRING DIAG: G99.242 (ICD-10-CM) - S/P total left hip arthroplasty  THERAPY DIAG:  Pain in left hip  Difficulty in walking, not elsewhere classified  Muscle weakness (generalized)  PERTINENT HISTORY: S/p nephrectomy 2003, Trichomonal infection. Foraminal stenosis of cervical region( receiving cortisone injections, chronic Left hip pain, OA of both hips, DDD, herniated Cervical  disc  PRECAUTIONS: Anterior Hip Precautians WEIGHT BEARING RESTRICTIONS Yes WBAT on L  FALLS:  Has patient fallen in last 6 months? Yes. Number of falls 3  fell before surgery but now feel like I am tripping up sometimes because of my L knee and my ankle  SUBJECTIVE: I am doing okay today. I have not taken anything. I got a cane at the thrift store.  I felt bad when it was raining.    PAIN:  11-10-21 Are you having pain? Are you having pain? Yes: NPRS scale: 6/10 pain  Pain location: left lateral hip  Pain description: sharp Aggravating factors: hitting knee on tub  sharp pain Relieving factors: propping my feet up, taking muscle relaxers and pain medication, ice   Are you having pain? Are you having pain? 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 Dedicated CT or MRI could confirm. 08-18-21 IMPRESSION: POST OA dx No evidence of left hip arthroplasty complication.   PATIENT SURVEYS:  LEFS 10/80 11-10-21 48/80   COGNITION:           Overall  cognitive status: Within functional limits for tasks assessed                          SENSATION: Light touch: Impaired  numbness over incision   MUSCLE LENGTH: Hamstrings: Right 60 deg; Left 50 degmust have PT assist on L Thomas test: NT post surgery   POSTURE:  Left hip level lower than R   PALPATION: Well healed anterior hip scar for THA on L, TTP over Left gluteals down ITB and piriformis   LE ROM:   Active ROM Right 10/13/2021 Left 10/13/2021 Left  10/28/21  Hip flexion 90 60 100 AROM  Hip extension 30 5   Hip abduction 38 20   Hip adduction       Hip internal rotation       Hip external rotation       Knee flexion 135/140 137/141   Knee  extension 0 0   Ankle dorsiflexion       Ankle plantarflexion       Ankle inversion       Ankle eversion        (Blank rows = not tested)   LE MMT:    unable to perform SLR with Left leg 10-13-21   MMT Right 10/13/2021 Left 10/13/2021 Left  11/24/21  Hip flexion 4- 3- 4/5  Hip extension 4- 3-   Hip abduction 4- 2+ 3+/5  Hip adduction       Hip internal rotation 4- 3- 4-/5  Hip external rotation 4- 3- 4-/5  Knee flexion 4+ 4   Knee extension 4 4   Ankle dorsiflexion       Ankle plantarflexion       Ankle inversion       Ankle eversion        (Blank rows = not tested)   LOWER EXTREMITY SPECIAL TESTS:  NT due to post L anterior THA   FUNCTIONAL TESTS:  5 times sit to stand: 32 sec  pain and must use hands  10-26-21 15.52 sec 6 minute walk test: 11/06/21:629 feet    6 MWT 986 ft without cane   GAIT: Distance walked: 120 feet Assistive device utilized: Single point cane Level of assistance: Complete Independence Comments: Pt with moderate difficulty and antalgic gait due to Left LE weakness and pain at 8/10.  Pt did negotiate steps with step to gait and use of cane and handrail       TODAY'S TREATMENT: [facility] Adult PT Treatment:                                                DATE: 11/26/21 Therapeutic Exercise: Nustep L4 LE only x 6 min Bilat heel raises Standing gastroc stretch bilat  6 inch step up x 15  left 6 inch lateral step up left x15 6 inch alternating and unilateral step taps Standing 2 way hip red band 10 x 3 each Side hip abduction x 10  Bridge x 15 x 2  with green band  Sit-stand from standard chair 25# 10 x 2  Left hip flexor stretch   [facility] Adult PT Treatment:  DATE: 11/24/21 Therapeutic Exercise: Nustep L4 LE only x 6 min Bilat heel raises 6 inch step up x 15  left 6 inch lateral step up left x15 6 inch alternating and unilateral step taps Standing 2 way hip red band 10 x 3 each Side hip abduction x 10 SLR  x 10 Bridge x 10 Sit-stand from bariatric chair x 10 x 2  [facility] Adult PT Treatment:                                                DATE: 11/19/21 Therapeutic Exercise: Nustep L4 LE only x 6 min 4 inch step up x 10  left 4 inch lateral step up left  Standing 2 way hip red band 10 x 2 each Side stepping red band at ankles 15 feet x 2 each way  Sit-stand 25# 10 x 2  Seated left LAQ 5# x 20 Hip flexor gentle stretch edge of mat 30 sec x 3  [facility] Adult PT Treatment:                                                DATE: 11-10-21 11-10-21 48/80 11-10-21 6 MWT 986 ft without cane   Therapeutic Exercise: 6 MWT 986 ft without cane with VC for proper gait sequence during walking without cane Monster walk 60 feet x 3 with UE support by PT RTB around feet Step ups on 8 in step 2 x 10 on R and L Sit to stand  25# 1 x 10 then 2 min rest then 1 x 10 - standard chair  Bridge with ball squeeze 3 x 10 SLR 2 x 8,  1 x 4 with 3 lb weight Standing hip flexor with RTB 3 x 10 Bilat heel raises 2 x 15 Standing hip abd with RTB 3 x 10 r and L Standing hip ext with RTB 3 x 10 R and L Manual Therapy: Scar tissue massage IASTYM over Left quad, glut med and scar tissue   [facility] Adult PT Treatment:                                                DATE: 11/06/21 Therapeutic Exercise: Nustep L4 LE only x 6 min 6MWT 629 feet (4 standing rest breaks)  Seated alternating march Seated alternating LAQ  Seated ball squeezes Step ups on 8 in step 2 x 10 on R and L Bilat heel raises  Cybex hip abduction 12.5 # 1 x 10 Cybex hip extension 12.5# 1x 10, 25# x 10  Sit to stand  15# 1 x 10- standard chair         PATIENT EDUCATION:  Education details: POC, explanation of findings , HEP issued , LEFS explanation 10/80, HEP given and updated Person educated: Patient Education method: Explanation, Demonstration, Tactile cues, Verbal cues, and Handouts Education comprehension: verbalized understanding, returned  demonstration, verbal cues required, tactile cues required, and needs further education     HOME EXERCISE PROGRAM: Access Code: [email or web address] URL: [email or web address] Date: 10/13/2021 Prepared by: Voncille Lo   Exercises - sit to stand with  Hands on knees or on sink with chair behind  - 1 x daily - 7 x weekly - 3 sets - 10 reps - short arc quad with gluteal tightning  - 1 x daily - 7 x weekly - 3 sets - 10 reps - Supine Heel Slide with Strap  - 1 x daily - 7 x weekly - 3 sets - 10 reps - Supine Ankle Pumps  - 1 x daily - 7 x weekly - 3 sets - 10 reps - Supine Hip Abduction  - 1 x daily - 7 x weekly - 3 sets - 10 reps - Supine Single Bent Knee Fallout  - 1 x daily - 7 x weekly - 3 sets - 10 reps - Long Arc Quad  - 1 x daily - 7 x weekly - 3 sets - 10 reps  Added 10-20-21  - Sidelying Hip Abduction  - 1 x daily - 7 x weekly - 3 sets - 10 reps - Supine Single Bent Knee Fallout  - 1 x daily - 7 x weekly - 3 sets - 10 reps - Standing Hip Abduction  - 1 x daily - 7 x weekly - 3 sets - 10 reps - Standing Hip Flexion March  - 1 x daily - 7 x weekly - 3 sets - 10 reps - Standing Hip Extension with Chair  - 1 x daily - 7 x weekly - 3 sets - 10 reps  Added 10-22-21 Access Code: [email or web address] URL: [email or web address] Date: 10/22/2021 Prepared by: Voncille Lo  Exercises - Long Sitting Quad Set  - 1 x daily - 7 x weekly - 5 sets - 10 reps - Seated Hip Flexion March with Ankle Weights  - 1 x daily - 7 x weekly - 3 sets - 10 reps   ASSESSMENT:   CLINICAL IMPRESSION: Pt enters clinic with 5/10 pain over Left anterior hip over scar tissue. She has been working on scar massage and walking.She arrives with [facility] and improved gait pattern. Continued with strengthening focus. Pt is making slow but progressive gains. Pt can don and doff all clothing, shoes and socks independently. LTG #6 met.  Will continue with skilled PT to maximize strength and function.     Eval-  Patient is a 47 y.o. female with  who was seen today for physical therapy evaluation and treatment for L THA for OA and limitation in flexion, IR before surgery.  Pt has not had PT post surgery and has been inconsistent with exercise due to 8/10 pain. Pt is unable to stand, walk or sit for longer than 10-15 minutes Pt. Has difficulty with walking and negotiating steps due to profound weakness of Left hip and LE. Pt will benefit from skilled PT to address impairments to return to being able to address self care and be more independent in home and community.      OBJECTIVE IMPAIRMENTS decreased activity tolerance, decreased knowledge of use of DME, decreased mobility, difficulty walking, decreased ROM, decreased strength, postural dysfunction, obesity, and pain.    ACTIVITY LIMITATIONS cleaning, driving, meal prep, laundry, and negotiating steps and walking .    PERSONAL FACTORS S/p nephrectomy 2003, Trichomonal infection. Foraminal stenosis of cervical region( receiving cortisone injections, chronic Left hip pain, OA of both hips, DDD, herniated Cervical disc are also affecting patient's functional outcome.      REHAB POTENTIAL: Good   CLINICAL DECISION MAKING: Evolving/moderate complexity   EVALUATION COMPLEXITY: Moderate     GOALS: Goals reviewed with patient?  Yes   SHORT TERM GOALS: Target date: 11/10/2021   Independent with initial exercise Baseline: inconsistent with exercise from hospital due to pain 10-26-21 pt now stretching daily Goal status: ACHIEVED   2.  Pt will be able to walk without limp and AD Baseline: Pt with moderate abnormal antalgic gait and SPC,  Pt now has new cane and walking in parking lot at home Status: 11/06/21: instructed pt in use of SPC instead of quad cane with significantly improved gait mechaincs for 6 MWT  Goal status: ONGOING   3.  Pt will tolerate standing and walking for 20 min without increased pain in order to return washing dishes 5/10 or  less Baseline: unable to wash dishes due to pain and decreased ability to stand greater than 15 min 10-26-21 needs to prop herself for 10 minutes to wash dishes Status: 11/06/21: pt reports no longer propping her self to complete washing Goal status: ONGOING   4.  Pt will be able to perform sit to stand with regular chair without use of hands Baseline: 5 x STS 32 sec and must use hands ( normal 13 sec or less without use of hands  10-26-21  5x sts 15.52 sec Goal status: ONGOING     LONG TERM GOALS: Target date: 12/08/2021   Pt will be independent with advanced HEP Baseline: no knowledge Goal status: ONGOING   2.  Pt will be able to incorporate home walking program without AD for 30 min 3 days a week Baseline: no walking program or regular exercise Status 11/19/21: pt is unable to walk more than 3 min 20 sec without stopping for standing rest break Goal status: ONGOING   3.  Pt will be able to stand for 30 minutes to do household chores like washing dishes and vacuuming with pain no greater than 3/10 Baseline: unable to do chores now and 8/10 with movement Status: 11/19/21: is washing dishes and making the bed, unable to vacum Goal status: ONGOING   4.  LEFS score will improve to at least 50/80  Baseline: 10/80 11-10-21 48/80 Goal status: ONGOING   5.  Pt will be able to negotiate steps with alternating gait pattern and pain 2/10 or less Baseline: Can only do step to pattern and use rails and cane for steps Status: has 2 steps without HR so uses step to pattern 11/26/21 Goal status: ONGOING   6.  Ms Shorb will be able to don/doff clothing and socks without exacerbating pain Baseline: Pt needing help with donning and doffing clothes at eval Status: 11/26/21 pt can don and doff all clothing, shoes and socks independently.  Goal status: MET   PLAN: PT FREQUENCY: 2x/week   PT DURATION: 8 weeks   PLANNED INTERVENTIONS: Therapeutic exercises, Therapeutic activity, Neuromuscular  re-education, Balance training, Gait training, Patient/Family education, Joint mobilization, Stair training, DME instructions, Aquatic Therapy, Dry Needling, Cryotherapy, Moist heat, Taping, and Manual therapy manual e stim with DN if necessary   PLAN FOR NEXT SESSION: Left hip strength and goals , single leg stand     Hessie Diener, PTA 11/26/21 11:46 AM Phone: [phone] Fax: [phone]

## 2021-12-01 ENCOUNTER — Ambulatory Visit: Payer: Medicaid Other | Admitting: Physical Therapy

## 2021-12-01 ENCOUNTER — Encounter: Payer: Self-pay | Admitting: Physical Therapy

## 2021-12-01 DIAGNOSIS — M25552 Pain in left hip: Secondary | ICD-10-CM | POA: Diagnosis not present

## 2021-12-01 DIAGNOSIS — M6281 Muscle weakness (generalized): Secondary | ICD-10-CM

## 2021-12-01 DIAGNOSIS — R262 Difficulty in walking, not elsewhere classified: Secondary | ICD-10-CM

## 2021-12-01 NOTE — Therapy (Signed)
OUTPATIENT PHYSICAL THERAPY TREATMENT NOTE   Patient Name: Kristin Montes MRN: [id number] DOB:1975-09-26, 46 y.o., female Today's Date: 12/01/2021  PCP: Dorna Mai, MD REFERRING PROVIDER: Dorna Mai, MD  END OF SESSION:   PT End of Session - 12/01/21 1039     Visit Number 11    Number of Visits 16    Date for PT Re-Evaluation 02/02/22    Authorization Time Period 10/14/21-11/28/21, 12 visits    Authorization - Visit Number 11    Authorization - Number of Visits 12    PT Start Time 4696    PT Stop Time 1100    PT Time Calculation (min) 45 min                Past Medical History:  Diagnosis Date   Arthritis    Complication of anesthesia    slow to wake up with nephrectomy   Depression    Headache    History of kidney stones    Pneumonia    Past Surgical History:  Procedure Laterality Date   HERNIA REPAIR     NEPHRECTOMY     TOTAL HIP ARTHROPLASTY Left 08/18/2021   Procedure: TOTAL HIP ARTHROPLASTY ANTERIOR APPROACH;  Surgeon: Paralee Cancel, MD;  Location: [facility];  Service: Orthopedics;  Laterality: Left;   TUBAL LIGATION     Patient Active Problem List   Diagnosis Date Noted   S/P total left hip arthroplasty 08/18/2021   DDD (degenerative disc disease), cervical 07/28/2021   Osteoarthritis of hip 06/13/2021   Chronic left hip pain 05/19/2021   Foraminal stenosis of cervical region 01/23/2021   Trichomonal infection 12/17/2020   Herniated cervical disc 09/28/2020   S/p nephrectomy 03/16/2013    REFERRING DIAG: E95.284 (ICD-10-CM) - S/P total left hip arthroplasty  THERAPY DIAG:  Pain in left hip  Difficulty in walking, not elsewhere classified  Muscle weakness (generalized)  PERTINENT HISTORY: S/p nephrectomy 2003, Trichomonal infection. Foraminal stenosis of cervical region( receiving cortisone injections, chronic Left hip pain, OA of both hips, DDD, herniated Cervical disc  PRECAUTIONS: Anterior Hip [place] WEIGHT BEARING RESTRICTIONS Yes  WBAT on L  FALLS:  Has patient fallen in last 6 months? Yes. Number of falls 3  fell before surgery but now feel like I am tripping up sometimes because of my L knee and my ankle  SUBJECTIVE: I am doing okay today. I took a pain pill this morning.   PAIN:   Are you having pain? Are you having pain? 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 Dedicated CT or MRI could confirm. 08-18-21 IMPRESSION: POST OA dx No evidence of left hip arthroplasty complication.   PATIENT SURVEYS:  LEFS 10/80 11-10-21 48/80   COGNITION:           Overall cognitive status: Within functional limits for tasks assessed                          SENSATION: Light touch: Impaired  numbness over incision   MUSCLE LENGTH: Hamstrings: Right 60 deg; Left 50 degmust have PT assist on L Thomas test: NT post surgery   POSTURE:  Left hip level lower than R  PALPATION: Well healed anterior hip scar for THA on L, TTP over Left gluteals down ITB and piriformis   LE ROM:   Active ROM Right 10/13/2021 Left 10/13/2021 Left  10/28/21  Hip flexion 90 60 100 AROM  Hip extension 30 5   Hip abduction 38 20   Hip adduction       Hip internal rotation       Hip external rotation       Knee flexion 135/140 137/141   Knee extension 0 0   Ankle dorsiflexion       Ankle plantarflexion       Ankle inversion       Ankle eversion        (Blank rows = not tested)   LE MMT:    unable to perform SLR with Left leg 10-13-21   MMT Right 10/13/2021 Left 10/13/2021 Left  11/24/21  Hip flexion 4- 3- 4/5  Hip extension 4- 3-   Hip abduction 4- 2+ 3+/5  Hip adduction       Hip internal rotation 4- 3- 4-/5  Hip external rotation 4- 3-  4-/5  Knee flexion 4+ 4   Knee extension 4 4   Ankle dorsiflexion       Ankle plantarflexion       Ankle inversion       Ankle eversion        (Blank rows = not tested)   LOWER EXTREMITY SPECIAL TESTS:  NT due to post L anterior THA   FUNCTIONAL TESTS:  5 times sit to stand: 32 sec  pain and must use hands  10-26-21 15.52 sec 6 minute walk test: 11/06/21:629 feet    6 MWT 986 ft without cane   GAIT: Distance walked: 120 feet Assistive device utilized: Single point cane Level of assistance: Complete Independence Comments: Pt with moderate difficulty and antalgic gait due to Left LE weakness and pain at 8/10.  Pt did negotiate steps with step to gait and use of cane and handrail       TODAY'S TREATMENT: [facility] Adult PT Treatment:                                                DATE: 12/01/21 Therapeutic Exercise: Nustep L4 LE only x 6 min Manual therapy Hip flexor stretch Bridge x15 Side hip abduction x 15  6 inch step up x 15  left 6 inch lateral step up left x15  SLS 15 sec left, tandem stance >30 sec  Sit-stand from standard chair 25# 10 x 2   Manual IASTM to left anterior thigh   [facility] Adult PT Treatment:                                                DATE: 11/26/21 Therapeutic Exercise: Nustep L4 LE only x 6 min Bilat heel raises Standing gastroc stretch bilat  6 inch step up x 15  left 6 inch lateral step up left x15 6 inch alternating and unilateral step taps Standing 2 way hip red band 10 x 3 each Side hip abduction x 10  Bridge x 15 x 2  with green band  Sit-stand from standard chair 25# 10 x 2  Left hip flexor stretch   [facility] Adult PT Treatment:                                                DATE: 11/24/21 Therapeutic Exercise: Nustep L4 LE only x 6 min Bilat heel raises 6 inch step up x 15  left 6 inch lateral step up left x15 6 inch alternating and unilateral step taps Standing 2 way hip red band 10 x 3 each Side hip abduction x 10 SLR x 10 Bridge  x 10 Sit-stand from bariatric chair x 10 x 2  [facility] Adult PT Treatment:                                                DATE: 11/19/21 Therapeutic Exercise: Nustep L4 LE only x 6 min 4 inch step up x 10  left 4 inch lateral step up left  Standing 2 way hip red band 10 x 2 each Side stepping red band at ankles 15 feet x 2 each way  Sit-stand 25# 10 x 2  Seated left LAQ 5# x 20 Hip flexor gentle stretch edge of mat 30 sec x 3  [facility] Adult PT Treatment:                                                DATE: 11-10-21 11-10-21 48/80 11-10-21 6 MWT 986 ft without cane   Therapeutic Exercise: 6 MWT 986 ft without cane with VC for proper gait sequence during walking without cane Monster walk 60 feet x 3 with UE support by PT RTB around feet Step ups on 8 in step 2 x 10 on R and L Sit to stand  25# 1 x 10 then 2 min rest then 1 x 10 - standard chair  Bridge with ball squeeze 3 x 10 SLR 2 x 8,  1 x 4 with 3 lb weight Standing hip flexor with RTB 3 x 10 Bilat heel raises 2 x 15 Standing hip abd with RTB 3 x 10 r and L Standing hip ext with RTB 3 x 10 R and L Manual Therapy: Scar tissue massage IASTYM over Left quad, glut med and scar tissue   [facility] Adult PT Treatment:                                                DATE: 11/06/21 Therapeutic Exercise: Nustep L4 LE only x 6 min 6MWT 629 feet (4 standing rest breaks)  Seated alternating march Seated alternating LAQ  Seated ball squeezes Step ups on 8 in step 2 x 10 on R and L Bilat heel raises  Cybex hip abduction 12.5 # 1 x 10 Cybex hip extension 12.5# 1x 10, 25# x 10  Sit to stand  15# 1 x 10- standard chair         PATIENT EDUCATION:  Education details: POC, explanation of findings , HEP issued , LEFS explanation 10/80, HEP given and  updated Person educated: Patient Education method: Explanation, Demonstration, Tactile cues, Verbal cues, and Handouts Education comprehension: verbalized understanding, returned demonstration, verbal cues  required, tactile cues required, and needs further education     HOME EXERCISE PROGRAM: Access Code: [email or web address] URL: [email or web address] Date: 10/13/2021 Prepared by: Voncille Lo   Exercises - sit to stand with Hands on knees or on sink with chair behind  - 1 x daily - 7 x weekly - 3 sets - 10 reps - short arc quad with gluteal tightning  - 1 x daily - 7 x weekly - 3 sets - 10 reps - Supine Heel Slide with Strap  - 1 x daily - 7 x weekly - 3 sets - 10 reps - Supine Ankle Pumps  - 1 x daily - 7 x weekly - 3 sets - 10 reps - Supine Hip Abduction  - 1 x daily - 7 x weekly - 3 sets - 10 reps - Supine Single Bent Knee Fallout  - 1 x daily - 7 x weekly - 3 sets - 10 reps - Long Arc Quad  - 1 x daily - 7 x weekly - 3 sets - 10 reps  Added 10-20-21  - Sidelying Hip Abduction  - 1 x daily - 7 x weekly - 3 sets - 10 reps - Supine Single Bent Knee Fallout  - 1 x daily - 7 x weekly - 3 sets - 10 reps - Standing Hip Abduction  - 1 x daily - 7 x weekly - 3 sets - 10 reps - Standing Hip Flexion March  - 1 x daily - 7 x weekly - 3 sets - 10 reps - Standing Hip Extension with Chair  - 1 x daily - 7 x weekly - 3 sets - 10 reps  Added 10-22-21 Access Code: [email or web address] URL: [email or web address] Date: 10/22/2021 Prepared by: Voncille Lo  Exercises - Long Sitting Quad Set  - 1 x daily - 7 x weekly - 5 sets - 10 reps - Seated Hip Flexion March with Ankle Weights  - 1 x daily - 7 x weekly - 3 sets - 10 reps   ASSESSMENT:   CLINICAL IMPRESSION:  Continued with strengthening focus. Performed IASTM to left thigh to reduce scar tissue.  Pt is making slow but progressive gains. She has difficulty with riding in car prolonged and is stiff when trying to get out of car.  Will continue with skilled PT to maximize strength and function.     Eval- Patient is a 46 y.o. female with  who was seen today for physical therapy evaluation and treatment for L THA for OA and  limitation in flexion, IR before surgery.  Pt has not had PT post surgery and has been inconsistent with exercise due to 8/10 pain. Pt is unable to stand, walk or sit for longer than 10-15 minutes Pt. Has difficulty with walking and negotiating steps due to profound weakness of Left hip and LE. Pt will benefit from skilled PT to address impairments to return to being able to address self care and be more independent in home and community.      OBJECTIVE IMPAIRMENTS decreased activity tolerance, decreased knowledge of use of DME, decreased mobility, difficulty walking, decreased ROM, decreased strength, postural dysfunction, obesity, and pain.    ACTIVITY LIMITATIONS cleaning, driving, meal prep, laundry, and negotiating steps and walking .    PERSONAL FACTORS S/p nephrectomy 2003, Trichomonal infection. Foraminal stenosis of cervical region( receiving cortisone injections, chronic Left hip pain,  OA of both hips, DDD, herniated Cervical disc are also affecting patient's functional outcome.      REHAB POTENTIAL: Good   CLINICAL DECISION MAKING: Evolving/moderate complexity   EVALUATION COMPLEXITY: Moderate     GOALS: Goals reviewed with patient? Yes   SHORT TERM GOALS: Target date: 11/10/2021   Independent with initial exercise Baseline: inconsistent with exercise from hospital due to pain 10-26-21 pt now stretching daily Goal status: ACHIEVED   2.  Pt will be able to walk without limp and AD Baseline: Pt with moderate abnormal antalgic gait and SPC,  Pt now has new cane and walking in parking lot at home Status: 11/06/21: instructed pt in use of SPC instead of quad cane with significantly improved gait mechaincs for 6 MWT  Goal status: ONGOING   3.  Pt will tolerate standing and walking for 20 min without increased pain in order to return washing dishes 5/10 or less Baseline: unable to wash dishes due to pain and decreased ability to stand greater than 15 min 10-26-21 needs to prop herself  for 10 minutes to wash dishes Status: 11/06/21: pt reports no longer propping her self to complete washing Goal status: ONGOING   4.  Pt will be able to perform sit to stand with regular chair without use of hands Baseline: 5 x STS 32 sec and must use hands ( normal 13 sec or less without use of hands  10-26-21  5x sts 15.52 sec Goal status: ONGOING     LONG TERM GOALS: Target date: 12/08/2021   Pt will be independent with advanced HEP Baseline: no knowledge Goal status: ONGOING   2.  Pt will be able to incorporate home walking program without AD for 30 min 3 days a week Baseline: no walking program or regular exercise Status 11/19/21: pt is unable to walk more than 3 min 20 sec without stopping for standing rest break Goal status: ONGOING   3.  Pt will be able to stand for 30 minutes to do household chores like washing dishes and vacuuming with pain no greater than 3/10 Baseline: unable to do chores now and 8/10 with movement Status: 11/19/21: is washing dishes and making the bed, unable to vacum Goal status: ONGOING   4.  LEFS score will improve to at least 50/80  Baseline: 10/80 11-10-21 48/80 Goal status: ONGOING   5.  Pt will be able to negotiate steps with alternating gait pattern and pain 2/10 or less Baseline: Can only do step to pattern and use rails and cane for steps Status: has 2 steps without HR so uses step to pattern 11/26/21 Goal status: ONGOING   6.  Ms Mareno will be able to don/doff clothing and socks without exacerbating pain Baseline: Pt needing help with donning and doffing clothes at eval Status: 11/26/21 pt can don and doff all clothing, shoes and socks independently.  Goal status: MET   PLAN: PT FREQUENCY: 2x/week   PT DURATION: 8 weeks   PLANNED INTERVENTIONS: Therapeutic exercises, Therapeutic activity, Neuromuscular re-education, Balance training, Gait training, Patient/Family education, Joint mobilization, Stair training, DME instructions, Aquatic  Therapy, Dry Needling, Cryotherapy, Moist heat, Taping, and Manual therapy manual e stim with DN if necessary   PLAN FOR NEXT SESSION: Left hip strength and goals , single leg stand     Hessie Diener, PTA 12/01/21 10:58 AM Phone: [phone] Fax: [phone]

## 2021-12-03 ENCOUNTER — Ambulatory Visit: Payer: Medicaid Other | Admitting: Physical Therapy

## 2021-12-03 ENCOUNTER — Encounter: Payer: Self-pay | Admitting: Physical Therapy

## 2021-12-03 DIAGNOSIS — M25552 Pain in left hip: Secondary | ICD-10-CM

## 2021-12-03 DIAGNOSIS — M6281 Muscle weakness (generalized): Secondary | ICD-10-CM

## 2021-12-03 DIAGNOSIS — R262 Difficulty in walking, not elsewhere classified: Secondary | ICD-10-CM

## 2021-12-03 NOTE — Therapy (Signed)
OUTPATIENT PHYSICAL THERAPY TREATMENT NOTE/ Re-evaluation   Patient Name: Kristin Montes MRN: [id number] DOB:[birth year], 46 y.o., female Today's Date: 12/03/2021  PCP: Dorna Mai, MD REFERRING PROVIDER: Dorna Mai, MD  END OF SESSION:   PT End of Session - 12/03/21 1100     Visit Number 12    Number of Visits 24    Date for PT Re-Evaluation 01/14/22    Authorization Type [employer] No vaso, ionto, estim or traction    Authorization Time Period 10/14/21-11/28/21, submitted for additional visits 12/03/21    Authorization - Visit Number 12    Authorization - Number of Visits 12    PT Start Time 1102    PT Stop Time 0630    PT Time Calculation (min) 43 min    Activity Tolerance Patient tolerated treatment well    Behavior During Therapy WFL for tasks assessed/performed                Past Medical History:  Diagnosis Date   Arthritis    Complication of anesthesia    slow to wake up with nephrectomy   Depression    Headache    History of kidney stones    Pneumonia    Past Surgical History:  Procedure Laterality Date   HERNIA REPAIR     NEPHRECTOMY     TOTAL HIP ARTHROPLASTY Left 08/18/2021   Procedure: TOTAL HIP ARTHROPLASTY ANTERIOR APPROACH;  Surgeon: Paralee Cancel, MD;  Location: [facility];  Service: Orthopedics;  Laterality: Left;   TUBAL LIGATION     Patient Active Problem List   Diagnosis Date Noted   S/P total left hip arthroplasty 08/18/2021   DDD (degenerative disc disease), cervical 07/28/2021   Osteoarthritis of hip 06/13/2021   Chronic left hip pain 05/19/2021   Foraminal stenosis of cervical region 01/23/2021   Trichomonal infection 12/17/2020   Herniated cervical disc 09/28/2020   S/p nephrectomy 03/16/2013    REFERRING DIAG: Z60.109 (ICD-10-CM) - S/P total left hip arthroplasty  THERAPY DIAG:  Pain in left hip  Difficulty in walking, not elsewhere classified  Muscle weakness (generalized)  PERTINENT HISTORY: S/p nephrectomy 2003,  Trichomonal infection. Foraminal stenosis of cervical region( receiving cortisone injections, chronic Left hip pain, OA of both hips, DDD, herniated Cervical disc  PRECAUTIONS: Anterior Hip Precautians WEIGHT BEARING RESTRICTIONS Yes WBAT on L  FALLS:  Has patient fallen in last 6 months? Yes. Number of falls 3  fell before surgery but now feel like I am tripping up sometimes because of my L knee and my ankle  SUBJECTIVE: I am hurting today and sore. My back is hurting more too as well as my hip today. I had to take muscle relaxers yesterday.   PAIN:  Are you having pain? Are you having pain? 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 Dedicated CT or MRI could confirm. 08-18-21 IMPRESSION: POST OA dx No evidence of left hip arthroplasty complication.   PATIENT SURVEYS:  LEFS 10/80 LEFS 5-25/23 32/80   COGNITION:           Overall cognitive status: Within functional limits for tasks assessed  SENSATION: Light touch: Impaired  numbness over incision   MUSCLE LENGTH: Hamstrings: Right 60 deg; Left 50 degmust have PT assist on L Thomas test: NT post surgery   POSTURE:  Left hip level lower than R   PALPATION: Well healed anterior hip scar for THA on L, TTP over Left gluteals down ITB and piriformis   LE ROM:   Active ROM Right 10/13/2021 Left 10/13/2021 Left  10/28/21 :Left  12/03/21  Hip flexion 90 60 100 AROM 120 AROM  Hip extension 30 5    Hip abduction 38 20    Hip adduction        Hip internal rotation        Hip external rotation        Knee flexion 135/140 137/141    Knee  extension 0 0    Ankle dorsiflexion        Ankle plantarflexion        Ankle inversion        Ankle eversion         (Blank rows = not tested)   LE MMT:    unable to perform SLR with Left leg 10-13-21   MMT Right 10/13/2021 Left 10/13/2021 Left  11/24/21 Left  12/03/21  Hip flexion 4- 3- 4/5 4+/5  Hip extension 4- 3-    Hip abduction 4- 2+ 3+/5 4-/5  Hip adduction        Hip internal rotation 4- 3- 4-/5 4/5  Hip external rotation 4- 3- 4-/5 4-/5  Knee flexion 4+ 4    Knee extension 4 4    Ankle dorsiflexion        Ankle plantarflexion        Ankle inversion        Ankle eversion         (Blank rows = not tested)   LOWER EXTREMITY SPECIAL TESTS:  NT due to post L anterior THA   FUNCTIONAL TESTS:  5 times sit to stand: 32 sec  pain and must use hands  10-26-21 15.52 sec 5 x STS 6 minute walk test: 11/06/21:629 feet    6 MWT 986 ft without cane: 11/10/21 6 minute walk test: 12/03/21: 1037 feet   GAIT: Distance walked: 120 feet Assistive device utilized: Single point cane Level of assistance: Complete Independence Comments: Pt with moderate difficulty and antalgic gait due to Left LE weakness and pain at 8/10.  Pt did negotiate steps with step to gait and use of cane and handrail       TODAY'S TREATMENT: [facility] Adult PT Treatment:                                                DATE: 12/03/21 Therapeutic Activity Nustep L4 LE only x 6 min 6MWT Stairs 12 stairs alternating single HR Objective measures and subjective reports of functional progress reviewed with patient.   [facility] Adult PT Treatment:                                                DATE: 12/01/21 Therapeutic Exercise: Nustep L4 LE only x 6 min Manual therapy Hip flexor stretch Bridge x15 Side hip abduction x 15  6 inch step up x  15  left 6 inch lateral step up left x15  SLS 15 sec left, tandem stance >30 sec  Sit-stand from standard chair 25# 10 x 2   Manual IASTM to left anterior thigh   [facility] Adult PT  Treatment:                                                DATE: 11/26/21 Therapeutic Exercise: Nustep L4 LE only x 6 min Bilat heel raises Standing gastroc stretch bilat  6 inch step up x 15  left 6 inch lateral step up left x15 6 inch alternating and unilateral step taps Standing 2 way hip red band 10 x 3 each Side hip abduction x 10  Bridge x 15 x 2  with green band  Sit-stand from standard chair 25# 10 x 2  Left hip flexor stretch   [facility] Adult PT Treatment:                                                DATE: 11/24/21 Therapeutic Exercise: Nustep L4 LE only x 6 min Bilat heel raises 6 inch step up x 15  left 6 inch lateral step up left x15 6 inch alternating and unilateral step taps Standing 2 way hip red band 10 x 3 each Side hip abduction x 10 SLR x 10 Bridge x 10 Sit-stand from bariatric chair x 10 x 2  [facility] Adult PT Treatment:                                                DATE: 11/19/21 Therapeutic Exercise: Nustep L4 LE only x 6 min 4 inch step up x 10  left 4 inch lateral step up left  Standing 2 way hip red band 10 x 2 each Side stepping red band at ankles 15 feet x 2 each way  Sit-stand 25# 10 x 2  Seated left LAQ 5# x 20 Hip flexor gentle stretch edge of mat 30 sec x 3  [facility] Adult PT Treatment:                                                DATE: 11-10-21 11-10-21 48/80 11-10-21 6 MWT 986 ft without cane   Therapeutic Exercise: 6 MWT 986 ft without cane with VC for proper gait sequence during walking without cane Monster walk 60 feet x 3 with UE support by PT RTB around feet Step ups on 8 in step 2 x 10 on R and L Sit to stand  25# 1 x 10 then 2 min rest then 1 x 10 - standard chair  Bridge with ball squeeze 3 x 10 SLR 2 x 8,  1 x 4 with 3 lb weight Standing hip flexor with RTB 3 x 10 Bilat heel raises 2 x 15 Standing hip abd with RTB 3 x 10 r and L Standing hip ext with RTB 3 x 10 R and L Manual Therapy: Scar  tissue massage IASTYM over Left quad, glut med  and scar tissue   [facility] Adult PT Treatment:                                                DATE: 11/06/21 Therapeutic Exercise: Nustep L4 LE only x 6 min 6MWT 629 feet (4 standing rest breaks)  Seated alternating march Seated alternating LAQ  Seated ball squeezes Step ups on 8 in step 2 x 10 on R and L Bilat heel raises  Cybex hip abduction 12.5 # 1 x 10 Cybex hip extension 12.5# 1x 10, 25# x 10  Sit to stand  15# 1 x 10- standard chair         PATIENT EDUCATION:  Education details: POC, explanation of findings , HEP issued , LEFS explanation 10/80, HEP given and updated Person educated: Patient Education method: Explanation, Demonstration, Tactile cues, Verbal cues, and Handouts Education comprehension: verbalized understanding, returned demonstration, verbal cues required, tactile cues required, and needs further education     HOME EXERCISE PROGRAM: Access Code: [email or web address] URL: [email or web address] Date: 10/13/2021 Prepared by: Voncille Lo   Exercises - sit to stand with Hands on knees or on sink with chair behind  - 1 x daily - 7 x weekly - 3 sets - 10 reps - short arc quad with gluteal tightning  - 1 x daily - 7 x weekly - 3 sets - 10 reps - Supine Heel Slide with Strap  - 1 x daily - 7 x weekly - 3 sets - 10 reps - Supine Ankle Pumps  - 1 x daily - 7 x weekly - 3 sets - 10 reps - Supine Hip Abduction  - 1 x daily - 7 x weekly - 3 sets - 10 reps - Supine Single Bent Knee Fallout  - 1 x daily - 7 x weekly - 3 sets - 10 reps - Long Arc Quad  - 1 x daily - 7 x weekly - 3 sets - 10 reps  Added 10-20-21  - Sidelying Hip Abduction  - 1 x daily - 7 x weekly - 3 sets - 10 reps - Supine Single Bent Knee Fallout  - 1 x daily - 7 x weekly - 3 sets - 10 reps - Standing Hip Abduction  - 1 x daily - 7 x weekly - 3 sets - 10 reps - Standing Hip Flexion March  - 1 x daily - 7 x weekly - 3 sets - 10 reps - Standing Hip Extension with Chair  - 1 x daily - 7 x weekly  - 3 sets - 10 reps  Added 10-22-21 Access Code: [email or web address] URL: [email or web address] Date: 10/22/2021 Prepared by: Voncille Lo  Exercises - Long Sitting Quad Set  - 1 x daily - 7 x weekly - 5 sets - 10 reps - Seated Hip Flexion March with Ankle Weights  - 1 x daily - 7 x weekly - 3 sets - 10 reps   ASSESSMENT:   CLINICAL IMPRESSION: Patient reports standing tolerance has improved to 15 minutes at most. She has returned to household tasks including vacuming and washing dishes. Her Left hip strength and AROM have improved, however weakness still present in hip abduction and external rotation. She is able to negotiate reciprocal stairs in clininc with 1 HR and good control. LEFS outcome measure has improved. She is  ambulating up to 6 minutes at one time and uses an electric cart for shopping. She has met LTG# 5 and #6. Pt with limitations in endurance/walking  and will need to be monitored in clinic to assess  and educate on how to improve using RPE scale. She would benefit from continued PT for an additional 6 visits to improve gait and standing /walking tolerance  and endurance to return to PLOF.   OBJECTIVE IMPAIRMENTS decreased activity tolerance, decreased knowledge of use of DME, decreased mobility, difficulty walking, decreased ROM, decreased strength, postural dysfunction, obesity, and pain.    ACTIVITY LIMITATIONS cleaning, driving, meal prep, laundry, and negotiating steps and walking .    PERSONAL FACTORS S/p nephrectomy 2003, Trichomonal infection. Foraminal stenosis of cervical region( receiving cortisone injections, chronic Left hip pain, OA of both hips, DDD, herniated Cervical disc are also affecting patient's functional outcome.      REHAB POTENTIAL: Good   CLINICAL DECISION MAKING: Evolving/moderate complexity   EVALUATION COMPLEXITY: Moderate     GOALS: Goals reviewed with patient? Yes   SHORT TERM GOALS: Target date: 11/10/2021   Independent with  initial exercise Baseline: inconsistent with exercise from hospital due to pain 10-26-21 pt now stretching daily Goal status: ACHIEVED   2.  Pt will be able to walk without limp and AD Baseline: Pt with moderate abnormal antalgic gait and SPC,  Pt now has new cane and walking in parking lot at home Status: 11/06/21: instructed pt in use of SPC instead of quad cane with significantly improved gait mechaincs for 6 MWT  Status: 12/03/21 : gait mechanics improving, still uses SPC.  Goal status: ONGOING   3.  Pt will tolerate standing and walking for 20 min without increased pain in order to return washing dishes 5/10 or less Baseline: unable to wash dishes due to pain and decreased ability to stand greater than 15 min 10-26-21 needs to prop herself for 10 minutes to wash dishes Status: 11/06/21: pt reports no longer propping her self to complete washing Status: 12/03/21: able to wash dishes with min increased pain 15 minutes Goal status: ONGOING   4.  Pt will be able to perform sit to stand with regular chair without use of hands Baseline: 5 x STS 32 sec and must use hands ( normal 13 sec or less without use of hands  Status: 10-26-21  5x sts 15.52 sec Status: 12/03/21: 5 x STS 14.8 sec  Goal status: MET      LONG TERM GOALS: Target date: 12/08/2021   Pt will be independent with advanced HEP Baseline: no knowledge, Initial HEP Goal status: ONGOING   2.  Pt will be able to incorporate home walking program without AD for 30 min 3 days a week Baseline: no walking program or regular exercise Status 11/19/21: pt is unable to walk more than 3 min 20 sec without stopping for standing rest break Status: 12/03/21: able to complete 6 minute walk test without stopping, using SPC, walks 5 minutes at home at most  Goal status: ONGOING   3.  Pt will be able to stand for 30 minutes to do household chores like washing dishes and vacuuming with pain no greater than 3/10 Baseline: unable to do chores now and 8/10  with movement Status: 11/19/21: is washing dishes and making the bed, unable to vacum Status: 12/03/21: is washing dishes and vacumming, up to 15 min max at one time  Goal status: ONGOING   4.  LEFS score will improve  to at least 50/80  Baseline: 10/80  12/03/21: 32/80 Goal status: ONGOING   5.  Pt will be able to negotiate steps with alternating gait pattern and pain 2/10 or less Baseline: Can only do step to pattern and use rails and cane for steps Status: 11/26/21 has 2 steps without HR so uses step to pattern  12/03/21: uses step to pattern at home, can negotiate 12 stairs in clinic with alternating pattern and 1 HR, no increased pain Goal status: MET   6.  Ms Lodato will be able to don/doff clothing and socks without exacerbating pain Baseline: Pt needing help with donning and doffing clothes at eval Status: 11/26/21 pt can don and doff all clothing, shoes and socks independently.  Goal status: MET   PLAN: PT FREQUENCY: 1 x a week   PT DURATION:   6 weeks   PLANNED INTERVENTIONS: Therapeutic exercises, Therapeutic activity, Neuromuscular re-education, Balance training, Gait training, Patient/Family education, Joint mobilization, Stair training, DME instructions, Aquatic Therapy, Dry Needling, Cryotherapy, Moist heat, Taping, and Manual therapy manual e stim with DN if necessary   PLAN FOR NEXT SESSION: Left hip strength, closed chain strength, standing tolerance, gait distance, walking program  Pt will need to demo on treadmill for 15 min   Check all possible CPT codes: 58850 - Re-evaluation, 97110- Therapeutic Exercise, [phone]- Neuro Re-education, [phone] - Gait Training, [phone] - Manual Therapy, 97530 - Therapeutic Activities, [id number] - Self Care, and 97750 - Physical performance training     If treatment provided at initial evaluation, no treatment charged due to lack of authorization.        Voncille Lo, PT, [place] Certified Exercise Expert for the Aging Adult  12/03/21 12:53  PM Phone: [phone] Fax: [phone]

## 2021-12-08 ENCOUNTER — Ambulatory Visit: Payer: Medicaid Other | Admitting: Physical Therapy

## 2021-12-08 ENCOUNTER — Encounter: Payer: Self-pay | Admitting: Physical Therapy

## 2021-12-08 DIAGNOSIS — M6281 Muscle weakness (generalized): Secondary | ICD-10-CM

## 2021-12-08 DIAGNOSIS — M25552 Pain in left hip: Secondary | ICD-10-CM

## 2021-12-08 DIAGNOSIS — R262 Difficulty in walking, not elsewhere classified: Secondary | ICD-10-CM

## 2021-12-08 NOTE — Therapy (Signed)
OUTPATIENT PHYSICAL THERAPY TREATMENT NOTE/ Re-evaluation   Patient Name: Kristin Montes MRN: [id number] DOB:Jul 03, 1976, 46 y.o., female Today's Date: 12/08/2021  PCP: Dorna Mai, MD REFERRING PROVIDER: Dorna Mai, MD  END OF SESSION:   PT End of Session - 12/08/21 0935     Visit Number 13    Number of Visits 24    Date for PT Re-Evaluation 01/14/22    Authorization Type [employer] No vaso, ionto, estim or traction    Authorization Time Period 10/14/21-11/28/21 (12 visits) , 12/04/21-01/02/22 (4 visits)    Authorization - Visit Number 1    Authorization - Number of Visits 4    PT Start Time 0932    PT Stop Time 4680    PT Time Calculation (min) 43 min                Past Medical History:  Diagnosis Date   Arthritis    Complication of anesthesia    slow to wake up with nephrectomy   Depression    Headache    History of kidney stones    Pneumonia    Past Surgical History:  Procedure Laterality Date   HERNIA REPAIR     NEPHRECTOMY     TOTAL HIP ARTHROPLASTY Left 08/18/2021   Procedure: TOTAL HIP ARTHROPLASTY ANTERIOR APPROACH;  Surgeon: Paralee Cancel, MD;  Location: [facility];  Service: Orthopedics;  Laterality: Left;   TUBAL LIGATION     Patient Active Problem List   Diagnosis Date Noted   S/P total left hip arthroplasty 08/18/2021   DDD (degenerative disc disease), cervical 07/28/2021   Osteoarthritis of hip 06/13/2021   Chronic left hip pain 05/19/2021   Foraminal stenosis of cervical region 01/23/2021   Trichomonal infection 12/17/2020   Herniated cervical disc 09/28/2020   S/p nephrectomy 03/16/2013    REFERRING DIAG: H21.224 (ICD-10-CM) - S/P total left hip arthroplasty  THERAPY DIAG:  Pain in left hip  Difficulty in walking, not elsewhere classified  Muscle weakness (generalized)  PERTINENT HISTORY: S/p nephrectomy 2003, Trichomonal infection. Foraminal stenosis of cervical region( receiving cortisone injections, chronic Left hip pain, OA  of both hips, DDD, herniated Cervical disc  PRECAUTIONS: Anterior Hip Precautians WEIGHT BEARING RESTRICTIONS Yes WBAT on L  FALLS:  Has patient fallen in last 6 months? Yes. Number of falls 3  fell before surgery but now feel like I am tripping up sometimes because of my L knee and my ankle  SUBJECTIVE: My back and neck have been hurting. The back hurts more than the hip. I walked some at home. I still did not use a timer but it was for 2 songs.   PAIN:  Are you having pain? Are you having pain? 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 Dedicated CT or MRI could confirm. 08-18-21 IMPRESSION: POST OA dx No evidence of left hip arthroplasty complication.   PATIENT SURVEYS:  LEFS 10/80 LEFS 5-25/23 32/80   COGNITION:           Overall cognitive status: Within functional limits for tasks assessed  SENSATION: Light touch: Impaired  numbness over incision   MUSCLE LENGTH: Hamstrings: Right 60 deg; Left 50 degmust have PT assist on L Thomas test: NT post surgery   POSTURE:  Left hip level lower than R   PALPATION: Well healed anterior hip scar for THA on L, TTP over Left gluteals down ITB and piriformis   LE ROM:   Active ROM Right 10/13/2021 Left 10/13/2021 Left  10/28/21 :Left  12/03/21  Hip flexion 90 60 100 AROM 120 AROM  Hip extension 30 5    Hip abduction 38 20    Hip adduction        Hip internal rotation        Hip external rotation        Knee flexion 135/140 137/141    Knee extension 0 0    Ankle dorsiflexion        Ankle plantarflexion        Ankle inversion         Ankle eversion         (Blank rows = not tested)   LE MMT:    unable to perform SLR with Left leg 10-13-21   MMT Right 10/13/2021 Left 10/13/2021 Left  11/24/21 Left  12/03/21  Hip flexion 4- 3- 4/5 4+/5  Hip extension 4- 3-    Hip abduction 4- 2+ 3+/5 4-/5  Hip adduction        Hip internal rotation 4- 3- 4-/5 4/5  Hip external rotation 4- 3- 4-/5 4-/5  Knee flexion 4+ 4    Knee extension 4 4    Ankle dorsiflexion        Ankle plantarflexion        Ankle inversion        Ankle eversion         (Blank rows = not tested)   LOWER EXTREMITY SPECIAL TESTS:  NT due to post L anterior THA   FUNCTIONAL TESTS:  5 times sit to stand: 32 sec  pain and must use hands  10-26-21 15.52 sec 5 x STS 6 minute walk test: 11/06/21:629 feet    6 MWT 986 ft without cane: 11/10/21 6 minute walk test: 12/03/21: 1037 feet   GAIT: Distance walked: 120 feet Assistive device utilized: Single point cane Level of assistance: Complete Independence Comments: Pt with moderate difficulty and antalgic gait due to Left LE weakness and pain at 8/10.  Pt did negotiate steps with step to gait and use of cane and handrail       TODAY'S TREATMENT: [facility] Adult PT Treatment:                                                DATE: 12/08/21 Therapeutic Exercise: Nustep L4 LE only x 6 min Tandem stance 60 sec each  Bilat heel raises  SLS 20 sec left , 23 right  Hip flexor stretch  Gait: T.M gait with cues for step length and heel strike- needs bilat UE, 1.2 MPH for 10 minutes   [facility] Adult PT Treatment:                                                DATE: 12/03/21 Therapeutic Activity/Re-eval Nustep L4  LE only x 6 min 6MWT Stairs 12 stairs alternating single HR Objective measures and subjective reports of functional progress reviewed with patient.   [facility] Adult PT Treatment:                                                DATE: 12/01/21 Therapeutic Exercise: Nustep L4 LE only x 6 min Manual therapy Hip flexor  stretch Bridge x15 Side hip abduction x 15  6 inch step up x 15  left 6 inch lateral step up left x15  SLS 15 sec left, tandem stance >30 sec  Sit-stand from standard chair 25# 10 x 2   Manual IASTM to left anterior thigh   [facility] Adult PT Treatment:                                                DATE: 11/26/21 Therapeutic Exercise: Nustep L4 LE only x 6 min Bilat heel raises Standing gastroc stretch bilat  6 inch step up x 15  left 6 inch lateral step up left x15 6 inch alternating and unilateral step taps Standing 2 way hip red band 10 x 3 each Side hip abduction x 10  Bridge x 15 x 2  with green band  Sit-stand from standard chair 25# 10 x 2  Left hip flexor stretch   [facility] Adult PT Treatment:                                                DATE: 11/24/21 Therapeutic Exercise: Nustep L4 LE only x 6 min Bilat heel raises 6 inch step up x 15  left 6 inch lateral step up left x15 6 inch alternating and unilateral step taps Standing 2 way hip red band 10 x 3 each Side hip abduction x 10 SLR x 10 Bridge x 10 Sit-stand from bariatric chair x 10 x 2  [facility] Adult PT Treatment:                                                DATE: 11/19/21 Therapeutic Exercise: Nustep L4 LE only x 6 min 4 inch step up x 10  left 4 inch lateral step up left  Standing 2 way hip red band 10 x 2 each Side stepping red band at ankles 15 feet x 2 each way  Sit-stand 25# 10 x 2  Seated left LAQ 5# x 20 Hip flexor gentle stretch edge of mat 30 sec x 3  [facility] Adult PT Treatment:                                                DATE: 11-10-21 11-10-21 48/80 11-10-21 6 MWT 986 ft without cane   Therapeutic Exercise: 6 MWT 986 ft without cane with VC for proper gait sequence during walking without cane  Monster walk 60 feet x 3 with UE support by PT RTB around feet Step ups on 8 in step 2 x 10 on R and L Sit to stand  25# 1 x 10 then 2 min rest then 1 x 10 - standard chair  Bridge with ball squeeze 3 x  10 SLR 2 x 8,  1 x 4 with 3 lb weight Standing hip flexor with RTB 3 x 10 Bilat heel raises 2 x 15 Standing hip abd with RTB 3 x 10 r and L Standing hip ext with RTB 3 x 10 R and L Manual Therapy: Scar tissue massage IASTYM over Left quad, glut med and scar tissue   [facility] Adult PT Treatment:                                                DATE: 11/06/21 Therapeutic Exercise: Nustep L4 LE only x 6 min 6MWT 629 feet (4 standing rest breaks)  Seated alternating march Seated alternating LAQ  Seated ball squeezes Step ups on 8 in step 2 x 10 on R and L Bilat heel raises  Cybex hip abduction 12.5 # 1 x 10 Cybex hip extension 12.5# 1x 10, 25# x 10  Sit to stand  15# 1 x 10- standard chair         PATIENT EDUCATION:  Education details: POC, explanation of findings , HEP issued , LEFS explanation 10/80, HEP given and updated Person educated: Patient Education method: Explanation, Demonstration, Tactile cues, Verbal cues, and Handouts Education comprehension: verbalized understanding, returned demonstration, verbal cues required, tactile cues required, and needs further education     HOME EXERCISE PROGRAM: Access Code: [email or web address] URL: [email or web address] Date: 10/13/2021 Prepared by: Voncille Lo   Exercises - sit to stand with Hands on knees or on sink with chair behind  - 1 x daily - 7 x weekly - 3 sets - 10 reps - short arc quad with gluteal tightning  - 1 x daily - 7 x weekly - 3 sets - 10 reps - Supine Heel Slide with Strap  - 1 x daily - 7 x weekly - 3 sets - 10 reps - Supine Ankle Pumps  - 1 x daily - 7 x weekly - 3 sets - 10 reps - Supine Hip Abduction  - 1 x daily - 7 x weekly - 3 sets - 10 reps - Supine Single Bent Knee Fallout  - 1 x daily - 7 x weekly - 3 sets - 10 reps - Long Arc Quad  - 1 x daily - 7 x weekly - 3 sets - 10 reps  Added 10-20-21  - Sidelying Hip Abduction  - 1 x daily - 7 x weekly - 3 sets - 10 reps - Supine Single Bent Knee  Fallout  - 1 x daily - 7 x weekly - 3 sets - 10 reps - Standing Hip Abduction  - 1 x daily - 7 x weekly - 3 sets - 10 reps - Standing Hip Flexion March  - 1 x daily - 7 x weekly - 3 sets - 10 reps - Standing Hip Extension with Chair  - 1 x daily - 7 x weekly - 3 sets - 10 reps  Added 10-22-21 Access Code: [email or web address] URL: [email or web address] Date: 10/22/2021 Prepared by: Voncille Lo  Exercises - Long Sitting Quad Set  -  1 x daily - 7 x weekly - 5 sets - 10 reps - Seated Hip Flexion March with Ankle Weights  - 1 x daily - 7 x weekly - 3 sets - 10 reps   ASSESSMENT:   CLINICAL IMPRESSION: Pt reports increasing back and neck pain. She walked a little at home over the weekend inside her home. She is still not consistent with a progressive walking program despite frequent education.Began gait on T.M for conditioning /endurance. Able to complete 10 minutes and a 50 minute mile pace. She needs bilateral UE assist on T.M.    Pt with limitations in endurance/walking  and will need to be monitored in clinic to assess  and educate on how to improve using RPE scale. She would benefit from continued PT for an additional 6 visits to improve gait and standing /walking tolerance  and endurance to return to PLOF.   OBJECTIVE IMPAIRMENTS decreased activity tolerance, decreased knowledge of use of DME, decreased mobility, difficulty walking, decreased ROM, decreased strength, postural dysfunction, obesity, and pain.    ACTIVITY LIMITATIONS cleaning, driving, meal prep, laundry, and negotiating steps and walking .    PERSONAL FACTORS S/p nephrectomy 2003, Trichomonal infection. Foraminal stenosis of cervical region( receiving cortisone injections, chronic Left hip pain, OA of both hips, DDD, herniated Cervical disc are also affecting patient's functional outcome.      REHAB POTENTIAL: Good   CLINICAL DECISION MAKING: Evolving/moderate complexity   EVALUATION COMPLEXITY: Moderate      GOALS: Goals reviewed with patient? Yes   SHORT TERM GOALS: Target date: 11/10/2021   Independent with initial exercise Baseline: inconsistent with exercise from hospital due to pain 10-26-21 pt now stretching daily Goal status: ACHIEVED   2.  Pt will be able to walk without limp and AD Baseline: Pt with moderate abnormal antalgic gait and SPC,  Pt now has new cane and walking in parking lot at home Status: 11/06/21: instructed pt in use of SPC instead of quad cane with significantly improved gait mechaincs for 6 MWT  Status: 12/03/21 : gait mechanics improving, still uses SPC.  Goal status: ONGOING   3.  Pt will tolerate standing and walking for 20 min without increased pain in order to return washing dishes 5/10 or less Baseline: unable to wash dishes due to pain and decreased ability to stand greater than 15 min 10-26-21 needs to prop herself for 10 minutes to wash dishes Status: 11/06/21: pt reports no longer propping her self to complete washing Status: 12/03/21: able to wash dishes with min increased pain 15 minutes Goal status: ONGOING   4.  Pt will be able to perform sit to stand with regular chair without use of hands Baseline: 5 x STS 32 sec and must use hands ( normal 13 sec or less without use of hands  Status: 10-26-21  5x sts 15.52 sec Status: 12/03/21: 5 x STS 14.8 sec  Goal status: MET      LONG TERM GOALS: Target date: 12/08/2021   Pt will be independent with advanced HEP Baseline: no knowledge, Initial HEP Goal status: ONGOING   2.  Pt will be able to incorporate home walking program without AD for 30 min 3 days a week Baseline: no walking program or regular exercise Status 11/19/21: pt is unable to walk more than 3 min 20 sec without stopping for standing rest break Status: 12/03/21: able to complete 6 minute walk test without stopping, using SPC, walks 5 minutes at home at most  Goal  status: ONGOING   3.  Pt will be able to stand for 30 minutes to do household chores  like washing dishes and vacuuming with pain no greater than 3/10 Baseline: unable to do chores now and 8/10 with movement Status: 11/19/21: is washing dishes and making the bed, unable to vacum Status: 12/03/21: is washing dishes and vacumming, up to 15 min max at one time  Goal status: ONGOING   4.  LEFS score will improve to at least 50/80  Baseline: 10/80  12/03/21: 32/80 Goal status: ONGOING   5.  Pt will be able to negotiate steps with alternating gait pattern and pain 2/10 or less Baseline: Can only do step to pattern and use rails and cane for steps Status: 11/26/21 has 2 steps without HR so uses step to pattern  12/03/21: uses step to pattern at home, can negotiate 12 stairs in clinic with alternating pattern and 1 HR, no increased pain Goal status: MET   6.  Ms Niccoli will be able to don/doff clothing and socks without exacerbating pain Baseline: Pt needing help with donning and doffing clothes at eval Status: 11/26/21 pt can don and doff all clothing, shoes and socks independently.  Goal status: MET   PLAN: PT FREQUENCY: 1 x a week   PT DURATION:   6 weeks   PLANNED INTERVENTIONS: Therapeutic exercises, Therapeutic activity, Neuromuscular re-education, Balance training, Gait training, Patient/Family education, Joint mobilization, Stair training, DME instructions, Aquatic Therapy, Dry Needling, Cryotherapy, Moist heat, Taping, and Manual therapy manual e stim with DN if necessary   PLAN FOR NEXT SESSION: Left hip strength, closed chain strength, standing tolerance, gait distance, walking program  Pt will need to demo on treadmill for 15 min, use RPE   Check all possible CPT codes: 97164 - Re-evaluation, 97110- Therapeutic Exercise, [phone]- Neuro Re-education, [phone] - Gait Training, [phone] - Manual Therapy, 97530 - Therapeutic Activities, 97535 - Self Care, and 97750 - Physical performance training     If treatment provided at initial evaluation, no treatment charged due to lack of  authorization.        Hessie Diener, PTA 12/08/21 12:52 PM Phone: [phone] Fax: [phone]

## 2021-12-15 ENCOUNTER — Encounter: Payer: Medicaid Other | Admitting: Physical Therapy

## 2021-12-17 ENCOUNTER — Encounter: Payer: Self-pay | Admitting: Physical Therapy

## 2021-12-17 ENCOUNTER — Ambulatory Visit: Payer: Medicaid Other | Attending: Family Medicine | Admitting: Physical Therapy

## 2021-12-17 DIAGNOSIS — R252 Cramp and spasm: Secondary | ICD-10-CM | POA: Diagnosis present

## 2021-12-17 DIAGNOSIS — M6281 Muscle weakness (generalized): Secondary | ICD-10-CM | POA: Diagnosis present

## 2021-12-17 DIAGNOSIS — M25552 Pain in left hip: Secondary | ICD-10-CM | POA: Diagnosis present

## 2021-12-17 DIAGNOSIS — R262 Difficulty in walking, not elsewhere classified: Secondary | ICD-10-CM | POA: Insufficient documentation

## 2021-12-17 DIAGNOSIS — M542 Cervicalgia: Secondary | ICD-10-CM | POA: Diagnosis present

## 2021-12-17 NOTE — Therapy (Signed)
OUTPATIENT PHYSICAL THERAPY TREATMENT NOTE/ Re-evaluation   Patient Name: Kristin Montes MRN: [id number] DOB:[birth year], 46 y.o., female Today's Date: 12/17/2021  PCP: Dorna Mai, MD REFERRING PROVIDER: Dorna Mai, MD  END OF SESSION:   PT End of Session - 12/17/21 1255     Visit Number 14    Number of Visits 24    Date for PT Re-Evaluation 01/14/22    Authorization Type [employer] No vaso, ionto, estim or traction    Authorization Time Period 10/14/21-11/28/21 (12 visits) , 12/04/21-01/02/22 (4 visits)    Authorization - Visit Number 2    Authorization - Number of Visits 4    PT Start Time 0175    PT Stop Time 1340    PT Time Calculation (min) 42 min                Past Medical History:  Diagnosis Date   Arthritis    Complication of anesthesia    slow to wake up with nephrectomy   Depression    Headache    History of kidney stones    Pneumonia    Past Surgical History:  Procedure Laterality Date   HERNIA REPAIR     NEPHRECTOMY     TOTAL HIP ARTHROPLASTY Left 08/18/2021   Procedure: TOTAL HIP ARTHROPLASTY ANTERIOR APPROACH;  Surgeon: Paralee Cancel, MD;  Location: [facility];  Service: Orthopedics;  Laterality: Left;   TUBAL LIGATION     Patient Active Problem List   Diagnosis Date Noted   S/P total left hip arthroplasty 08/18/2021   DDD (degenerative disc disease), cervical 07/28/2021   Osteoarthritis of hip 06/13/2021   Chronic left hip pain 05/19/2021   Foraminal stenosis of cervical region 01/23/2021   Trichomonal infection 12/17/2020   Herniated cervical disc 09/28/2020   S/p nephrectomy 03/16/2013    REFERRING DIAG: Z02.585 (ICD-10-CM) - S/P total left hip arthroplasty  THERAPY DIAG:  Pain in left hip  Difficulty in walking, not elsewhere classified  Muscle weakness (generalized)  PERTINENT HISTORY: S/p nephrectomy 2003, Trichomonal infection. Foraminal stenosis of cervical region( receiving cortisone injections, chronic Left hip pain, OA  of both hips, DDD, herniated Cervical disc  PRECAUTIONS: Anterior Hip Precautians  WEIGHT BEARING RESTRICTIONS Yes WBAT on L  FALLS:  Has patient fallen in last 6 months? Yes. Number of falls 3  fell before surgery but now feel like I am tripping up sometimes because of my L knee and my ankle  SUBJECTIVE: I have been walking more and I am doing better and better. My neck has been hurting and I will have an MRI. The back is still bothering me.    PAIN:  Are you having pain? Are you having pain? 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 Dedicated CT or MRI could confirm. 08-18-21 IMPRESSION: POST OA dx No evidence of left hip arthroplasty complication.   PATIENT SURVEYS:  LEFS 10/80 LEFS 5-25/23 32/80   COGNITION:           Overall cognitive status: Within functional limits for tasks assessed  SENSATION: Light touch: Impaired  numbness over incision   MUSCLE LENGTH: Hamstrings: Right 60 deg; Left 50 degmust have PT assist on L Thomas test: NT post surgery   POSTURE:  Left hip level lower than R   PALPATION: Well healed anterior hip scar for THA on L, TTP over Left gluteals down ITB and piriformis   LE ROM:   Active ROM Right 10/13/2021 Left 10/13/2021 Left  10/28/21 :Left  12/03/21  Hip flexion 90 60 100 AROM 120 AROM  Hip extension 30 5    Hip abduction 38 20    Hip adduction        Hip internal rotation        Hip external rotation        Knee flexion 135/140 137/141    Knee extension 0 0    Ankle dorsiflexion        Ankle plantarflexion        Ankle inversion         Ankle eversion         (Blank rows = not tested)   LE MMT:    unable to perform SLR with Left leg 10-13-21   MMT Right 10/13/2021 Left 10/13/2021 Left  11/24/21 Left  12/03/21  Hip flexion 4- 3- 4/5 4+/5  Hip extension 4- 3-    Hip abduction 4- 2+ 3+/5 4-/5  Hip adduction        Hip internal rotation 4- 3- 4-/5 4/5  Hip external rotation 4- 3- 4-/5 4-/5  Knee flexion 4+ 4    Knee extension 4 4    Ankle dorsiflexion        Ankle plantarflexion        Ankle inversion        Ankle eversion         (Blank rows = not tested)   LOWER EXTREMITY SPECIAL TESTS:  NT due to post L anterior THA   FUNCTIONAL TESTS:  5 times sit to stand: 32 sec  pain and must use hands  10-26-21 15.52 sec 5 x STS 6 minute walk test: 11/06/21:629 feet    6 MWT 986 ft without cane: 11/10/21 6 minute walk test: 12/03/21: 1037 feet   GAIT: Distance walked: 120 feet Assistive device utilized: Single point cane Level of assistance: Complete Independence Comments: Pt with moderate difficulty and antalgic gait due to Left LE weakness and pain at 8/10.  Pt did negotiate steps with step to gait and use of cane and handrail       TODAY'S TREATMENT: [facility] Adult PT Treatment:                                                DATE: 12/17/21 Therapeutic Exercise: Nustep L4 LE only x 5 min Gait  20 minutes-see below Bilat heel raises Standing gastroc and hip flexor stretches  Alternating stairs up and down 12 steps with 1 HR Retro step ups 4 inch x 10 left 1 HR  Gait: T.M gait with cues for step length and heel strike- used 1 UE support today, 1.3 (increased to 1.5 mph at 10 minutes) MPH for 20 minutes total (RPE 11, then 13-somewhat hard,  in last 10 minutes) Pt remained in closed chain for 37 minutes during treatment today Trial of trekking pole, 150 feet -   [facility] Adult PT Treatment:  DATE: 12/08/21 Therapeutic Exercise: Nustep L4 LE only x 6 min Tandem stance 60 sec each   Bilat heel raises  SLS 20 sec left , 23 right  Hip flexor stretch  Gait: T.M gait with cues for step length and heel strike- needs bilat UE, 1.2 MPH for 10 minutes   [facility] Adult PT Treatment:                                                DATE: 12/03/21 Therapeutic Activity/Re-eval Nustep L4 LE only x 6 min 6MWT Stairs 12 stairs alternating single HR Objective measures and subjective reports of functional progress reviewed with patient.   [facility] Adult PT Treatment:                                                DATE: 12/01/21 Therapeutic Exercise: Nustep L4 LE only x 6 min Manual therapy Hip flexor stretch Bridge x15 Side hip abduction x 15  6 inch step up x 15  left 6 inch lateral step up left x15  SLS 15 sec left, tandem stance >30 sec  Sit-stand from standard chair 25# 10 x 2   Manual IASTM to left anterior thigh   [facility] Adult PT Treatment:                                                DATE: 11/26/21 Therapeutic Exercise: Nustep L4 LE only x 6 min Bilat heel raises Standing gastroc stretch bilat  6 inch step up x 15  left 6 inch lateral step up left x15 6 inch alternating and unilateral step taps Standing 2 way hip red band 10 x 3 each Side hip abduction x 10  Bridge x 15 x 2  with green band  Sit-stand from standard chair 25# 10 x 2  Left hip flexor stretch      PATIENT EDUCATION:  Education details: POC, explanation of findings , HEP issued , LEFS explanation 10/80, HEP given and updated Person educated: Patient Education method: Explanation, Demonstration, Tactile cues, Verbal cues, and Handouts Education comprehension: verbalized understanding, returned demonstration, verbal cues required, tactile cues required, and needs further education     HOME EXERCISE PROGRAM: Access Code: [email or web address] URL: [email or web address] Date: 10/13/2021 Prepared by: Voncille Lo   Exercises - sit to stand with Hands on knees or on sink with chair behind  -  1 x daily - 7 x weekly - 3 sets - 10 reps - short arc quad with gluteal tightning  - 1 x daily - 7 x weekly - 3 sets - 10 reps - Supine Heel Slide with Strap  - 1 x daily - 7 x weekly - 3 sets - 10 reps - Supine Ankle Pumps  - 1 x daily - 7 x weekly - 3 sets - 10 reps - Supine Hip Abduction  - 1 x daily - 7 x weekly - 3 sets - 10 reps - Supine Single Bent Knee Fallout  - 1 x daily - 7 x weekly - 3 sets - 10 reps - Long [employer]  - 1  x daily - 7 x weekly - 3 sets - 10 reps  Added 10-20-21  - Sidelying Hip Abduction  - 1 x daily - 7 x weekly - 3 sets - 10 reps - Supine Single Bent Knee Fallout  - 1 x daily - 7 x weekly - 3 sets - 10 reps - Standing Hip Abduction  - 1 x daily - 7 x weekly - 3 sets - 10 reps - Standing Hip Flexion March  - 1 x daily - 7 x weekly - 3 sets - 10 reps - Standing Hip Extension with Chair  - 1 x daily - 7 x weekly - 3 sets - 10 reps  Added 10-22-21 Access Code: [email or web address] URL: [email or web address] Date: 10/22/2021 Prepared by: Voncille Lo  Exercises - Long Sitting Quad Set  - 1 x daily - 7 x weekly - 5 sets - 10 reps - Seated Hip Flexion March with Ankle Weights  - 1 x daily - 7 x weekly - 3 sets - 10 reps   ASSESSMENT:   CLINICAL IMPRESSION: Pt reports increased walking up to 7-10 min 3 x this week. She is feeling better and rates pain at 0/10 today although she did take OTC and prescription pain meds due to a dental procedure. Pt reports she continues to have issues with neck and back and will have a neck MRI this week. Continued gait on T.M with increased time to 20 minutes and RPE of 13 (somewhat hard). Pt able to remain in closed chain to try trekking pole which she was pleased with as well as navigate stairs. She was in closed chain for a total of 37 minutes with minimal increased in pain/discomfort in hip. Her remaining goals are focused on gait and standing tolerance. Will continue to progress gait distance and RPE over remaining visits.    2 more visits approved.   for conditioning /endurance. Able to complete 10 minutes and a 50 minute mile pace. She needs bilateral UE assist on T.M.    Pt with limitations in endurance/walking  and will need to be monitored in clinic to assess  and educate on how to improve using RPE scale. She would benefit from continued PT for an additional 6 visits to improve gait and standing /walking tolerance  and endurance to return to PLOF.    OBJECTIVE IMPAIRMENTS decreased activity tolerance, decreased knowledge of use of DME, decreased mobility, difficulty walking, decreased ROM, decreased strength, postural dysfunction, obesity, and pain.    ACTIVITY LIMITATIONS cleaning, driving, meal prep, laundry, and negotiating steps and walking .    PERSONAL FACTORS S/p nephrectomy 2003, Trichomonal infection. Foraminal stenosis of cervical region( receiving cortisone injections, chronic Left hip pain, OA of both hips, DDD, herniated Cervical disc are also affecting patient's functional outcome.      REHAB POTENTIAL: Good   CLINICAL DECISION MAKING: Evolving/moderate complexity   EVALUATION COMPLEXITY: Moderate     GOALS: Goals reviewed with patient? Yes   SHORT TERM GOALS: Target date: 11/10/2021   Independent with initial exercise Baseline: inconsistent with exercise from hospital due to pain 10-26-21 pt now stretching daily Goal status: ACHIEVED   2.  Pt will be able to walk without limp and AD Baseline: Pt with moderate abnormal antalgic gait and SPC,  Pt now has new cane and walking in parking lot at home Status: 11/06/21: instructed pt in use of SPC instead of quad cane with significantly improved gait mechaincs for 6 MWT  Status: 12/03/21 : gait mechanics improving,  still uses SPC.  Goal status: ONGOING   3.  Pt will tolerate standing and walking for 20 min without increased pain in order to return washing dishes 5/10 or less Baseline: unable to wash dishes due to pain and decreased ability to  stand greater than 15 min 10-26-21 needs to prop herself for 10 minutes to wash dishes Status: 11/06/21: pt reports no longer propping her self to complete washing Status: 12/03/21: able to wash dishes with min increased pain 15 minutes Goal status: ONGOING   4.  Pt will be able to perform sit to stand with regular chair without use of hands Baseline: 5 x STS 32 sec and must use hands ( normal 13 sec or less without use of hands ) Status: 10-26-21  5x sts 15.52 sec Status: 12/03/21: 5 x STS 14.8 sec  Goal status: MET      LONG TERM GOALS: Target date: 12/08/2021   Pt will be independent with advanced HEP Baseline: no knowledge, Initial HEP Goal status: ONGOING   2.  Pt will be able to incorporate home walking program without AD for 30 min 3 days a week Baseline: no walking program or regular exercise Status 11/19/21: pt is unable to walk more than 3 min 20 sec without stopping for standing rest break Status: 12/03/21: able to complete 6 minute walk test without stopping, using SPC, walks 5 minutes at home at most  Status: 12/17/21: is walking more often, 3 x this week 7-10 minutes  Goal status: ONGOING   3.  Pt will be able to stand for 30 minutes to do household chores like washing dishes and vacuuming with pain no greater than 3/10 Baseline: unable to do chores now and 8/10 with movement Status: 11/19/21: is washing dishes and making the bed, unable to vacum Status: 12/03/21: is washing dishes and vacumming, up to 15 min max at one time  Goal status: ONGOING   4.  LEFS score will improve to at least 50/80  Baseline: 10/80  12/03/21: 32/80 Goal status: ONGOING   5.  Pt will be able to negotiate steps with alternating gait pattern and pain 2/10 or less Baseline: Can only do step to pattern and use rails and cane for steps Status: 11/26/21 has 2 steps without HR so uses step to pattern  12/03/21: uses step to pattern at home, can negotiate 12 stairs in clinic with alternating pattern and 1 HR,  no increased pain Goal status: MET   6.  Ms Porche will be able to don/doff clothing and socks without exacerbating pain Baseline: Pt needing help with donning and doffing clothes at eval Status: 11/26/21 pt can don and doff all clothing, shoes and socks independently.  Goal status: MET   PLAN: PT FREQUENCY: 1 x a week   PT DURATION:   6 weeks   PLANNED INTERVENTIONS: Therapeutic exercises, Therapeutic activity, Neuromuscular re-education, Balance training, Gait training, Patient/Family education, Joint mobilization, Stair training, DME instructions, Aquatic Therapy, Dry Needling, Cryotherapy, Moist heat, Taping, and Manual therapy manual e stim with DN if necessary   PLAN FOR NEXT SESSION:  gait distance, walking program  Pt will need to demo on treadmill for 20 min, use RPE (at least 13 RPE)   Check all possible CPT codes: 97164 - Re-evaluation, 97110- Therapeutic Exercise, [phone]- Neuro Re-education, [id number] - Gait Training, 97140 - Manual Therapy, 97530 - Therapeutic Activities, 68032 - Self Care, and 97750 - Physical performance training     If treatment provided at initial  evaluation, no treatment charged due to lack of authorization.        Hessie Diener, PTA 12/17/21 1:53 PM Phone: [phone] Fax: [phone]

## 2021-12-22 ENCOUNTER — Encounter: Payer: Medicaid Other | Admitting: Physical Therapy

## 2021-12-23 ENCOUNTER — Ambulatory Visit: Payer: Medicaid Other | Admitting: Neurology

## 2021-12-23 NOTE — Therapy (Signed)
OUTPATIENT PHYSICAL THERAPY TREATMENT NOTE/ Re-evaluation   Patient Name: Kristin Montes MRN: [id number] DOB:January 06, 1976, 46 y.o., female Today's Date: 12/24/2021  PCP: Dorna Mai, MD REFERRING PROVIDER: Dorna Mai, MD  END OF SESSION:   PT End of Session - 12/24/21 1038     Visit Number 15    Number of Visits 24    Date for PT Re-Evaluation 01/14/22    Authorization Type [employer] No vaso, ionto, estim or traction    Authorization Time Period 10/14/21-11/28/21 (12 visits) , 12/04/21-01/02/22 (4 visits)    PT Start Time 1045    PT Stop Time 1140    PT Time Calculation (min) 55 min    Activity Tolerance Patient tolerated treatment well    Behavior During Therapy WFL for tasks assessed/performed                 Past Medical History:  Diagnosis Date   Arthritis    Complication of anesthesia    slow to wake up with nephrectomy   Depression    Headache    History of kidney stones    Pneumonia    Past Surgical History:  Procedure Laterality Date   HERNIA REPAIR     NEPHRECTOMY     TOTAL HIP ARTHROPLASTY Left 08/18/2021   Procedure: TOTAL HIP ARTHROPLASTY ANTERIOR APPROACH;  Surgeon: Paralee Cancel, MD;  Location: [facility];  Service: Orthopedics;  Laterality: Left;   TUBAL LIGATION     Patient Active Problem List   Diagnosis Date Noted   S/P total left hip arthroplasty 08/18/2021   DDD (degenerative disc disease), cervical 07/28/2021   Osteoarthritis of hip 06/13/2021   Chronic left hip pain 05/19/2021   Foraminal stenosis of cervical region 01/23/2021   Trichomonal infection 12/17/2020   Herniated cervical disc 09/28/2020   S/p nephrectomy 03/16/2013    REFERRING DIAG: P71.062 (ICD-10-CM) - S/P total left hip arthroplasty  THERAPY DIAG:  Pain in left hip  Difficulty in walking, not elsewhere classified  Muscle weakness (generalized)  PERTINENT HISTORY: S/p nephrectomy 2003, Trichomonal infection. Foraminal stenosis of cervical region( receiving  cortisone injections, chronic Left hip pain, OA of both hips, DDD, herniated Cervical disc  PRECAUTIONS: Anterior Hip Precautians  WEIGHT BEARING RESTRICTIONS Yes WBAT on L  FALLS:  Has patient fallen in last 6 months? Yes. Number of falls 3  fell before surgery but now feel like I am tripping up sometimes because of my L knee and my ankle  SUBJECTIVE:  I had an MRI for my neck.  I have not heard yet about results.  I dont if it is the weather but I am a 5/10. I am trying not to use your cane today.  PAIN:  Are you having pain? Are you having pain? 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 Dedicated CT or MRI could confirm. 08-18-21 IMPRESSION: POST OA dx No evidence of left hip arthroplasty complication.   PATIENT SURVEYS:  LEFS 10/80 LEFS 5-25/23 32/80 LEFS 12-24-21 59/80 73.75%   COGNITION:           Overall cognitive status: Within functional limits for tasks assessed  SENSATION: Light touch: Impaired  numbness over incision   MUSCLE LENGTH: Hamstrings: Right 60 deg; Left 50 degmust have PT assist on L Thomas test: NT post surgery   POSTURE:  Left hip level lower than R   PALPATION: Well healed anterior hip scar for THA on L, TTP over Left gluteals down ITB and piriformis   LE ROM:   Active ROM Right 10/13/2021 Left 10/13/2021 Left  10/28/21 :Left  12/03/21  Hip flexion 90 60 100 AROM 120 AROM  Hip extension 30 5    Hip abduction 38 20    Hip adduction        Hip internal rotation        Hip external rotation        Knee flexion 135/140 137/141    Knee extension 0 0     Ankle dorsiflexion        Ankle plantarflexion        Ankle inversion        Ankle eversion         (Blank rows = not tested)   LE MMT:    unable to perform SLR with Left leg 10-13-21   MMT Right 10/13/2021 Left 10/13/2021 Left  11/24/21 Left  12/03/21  Hip flexion 4- 3- 4/5 4+/5  Hip extension 4- 3-    Hip abduction 4- 2+ 3+/5 4-/5  Hip adduction        Hip internal rotation 4- 3- 4-/5 4/5  Hip external rotation 4- 3- 4-/5 4-/5  Knee flexion 4+ 4    Knee extension 4 4    Ankle dorsiflexion        Ankle plantarflexion        Ankle inversion        Ankle eversion         (Blank rows = not tested)   LOWER EXTREMITY SPECIAL TESTS:  NT due to post L anterior THA   FUNCTIONAL TESTS:  5 times sit to stand: 32 sec  pain and must use hands  10-26-21 15.52 sec 5 x STS 6 minute walk test: 11/06/21:629 feet    6 MWT 986 ft without cane: 11/10/21 6 minute walk test: 12/03/21: 1037 feet   GAIT: Distance walked: 120 feet Assistive device utilized: Single point cane Level of assistance: Complete Independence Comments: Pt with moderate difficulty and antalgic gait due to Left LE weakness and pain at 8/10.  Pt did negotiate steps with step to gait and use of cane and handrail       TODAY'S TREATMENT: [facility] Adult PT Treatment:                                                DATE: 12-24-21  Therapeutic Exercise: Gait  20 minutes-see below Bilat heel raises utliziing trek pole in R UE  Standing resisted hip flexion with RTB 2 x 10 Standing and raising Left Hip flexion over 17 inch block 2 x 5 each.  2 x 5 with 3 lb Therapeutic Activity-   Rolling to right and left on mat x 3  Transfer/simulate in and out of car with hip flexion and no UE support Gait: T.M gait with cues for step length and heel strike- used 1 UE support today, 1.3 (increased to 1.5 mph at 5 minutes, at 10 min 1.7 mph after 15 min 1.8 )  MPH for 20 minutes total (RPE 11 after 5 min, 12 at 10 min then 13-somewhat hard,  in last 5  minutes) trekking pole, 300 feet -   [facility] Adult PT Treatment:                                                DATE: 12/17/21 Therapeutic Exercise: Nustep L4 LE only x 5 min Gait  20 minutes-see below Bilat heel raises Standing gastroc and hip flexor stretches  Alternating stairs up and down 12 steps with 1 HR Retro step ups 4 inch x 10 left 1 HR  Gait: T.M gait with cues for step length and heel strike- used 1 UE support today, 1.3 (increased to 1.5 mph at 10 minutes) MPH for 20 minutes total (RPE 11, then 13-somewhat hard,  in last 10 minutes) Pt remained in closed chain for 37 minutes during treatment today Trial of trekking pole, 150 feet -   [facility] Adult PT Treatment:                                                DATE: 12/08/21 Therapeutic Exercise: Nustep L4 LE only x 6 min Tandem stance 60 sec each  Bilat heel raises  SLS 20 sec left , 23 right  Hip flexor stretch  Gait: T.M gait with cues for step length and heel strike- needs bilat UE, 1.2 MPH for 10 minutes   [facility] Adult PT Treatment:                                                DATE: 12/03/21 Therapeutic Activity/Re-eval Nustep L4 LE only x 6 min 6MWT Stairs 12 stairs alternating single HR Objective measures and subjective reports of functional progress reviewed with patient.   [facility] Adult PT Treatment:                                                DATE: 12/01/21 Therapeutic Exercise: Nustep L4 LE only x 6 min Manual therapy Hip flexor stretch Bridge x15 Side hip abduction x 15  6 inch step up x 15  left 6 inch lateral step up left x15  SLS 15 sec left, tandem stance >30 sec  Sit-stand from standard chair 25# 10 x 2   Manual IASTM to left anterior thigh   [facility] Adult PT Treatment:                                                DATE: 11/26/21 Therapeutic Exercise: Nustep L4 LE only x 6 min Bilat heel raises Standing gastroc stretch bilat  6 inch step up x 15  left 6 inch lateral step up left x15 6 inch  alternating and unilateral step taps Standing 2 way hip red band 10 x 3 each Side hip abduction x 10  Bridge x 15 x 2  with green band  Sit-stand from standard chair 25# 10 x 2  Left hip flexor stretch      PATIENT EDUCATION:  Education details: POC, explanation of findings , HEP issued , LEFS explanation 10/80, HEP given and updated Person educated: Patient Education method: Explanation, Demonstration, Tactile cues, Verbal cues, and Handouts Education comprehension: verbalized understanding, returned demonstration, verbal cues required, tactile cues required, and needs further education     HOME EXERCISE PROGRAM: Access Code: [email or web address] URL: [email or web address] Date: 10/13/2021 Prepared by: Voncille Lo   Exercises - sit to stand with Hands on knees or on sink with chair behind  - 1 x daily - 7 x weekly - 3 sets - 10 reps - short arc quad with gluteal tightning  - 1 x daily - 7 x weekly - 3 sets - 10 reps - Supine Heel Slide with Strap  - 1 x daily - 7 x weekly - 3 sets - 10 reps - Supine Ankle Pumps  - 1 x daily - 7 x weekly - 3 sets - 10 reps - Supine Hip Abduction  - 1 x daily - 7 x weekly - 3 sets - 10 reps - Supine Single Bent Knee Fallout  - 1 x daily - 7 x weekly - 3 sets - 10 reps - Long Arc Quad  - 1 x daily - 7 x weekly - 3 sets - 10 reps  Added 10-20-21  - Sidelying Hip Abduction  - 1 x daily - 7 x weekly - 3 sets - 10 reps - Supine Single Bent Knee Fallout  - 1 x daily - 7 x weekly - 3 sets - 10 reps - Standing Hip Abduction  - 1 x daily - 7 x weekly - 3 sets - 10 reps - Standing Hip Flexion March  - 1 x daily - 7 x weekly - 3 sets - 10 reps - Standing Hip Extension with Chair  - 1 x daily - 7 x weekly - 3 sets - 10 reps  Added 10-22-21 Access Code: [email or web address] URL: [email or web address] Date: 10/22/2021 Prepared by: Voncille Lo  Exercises - Long Sitting Quad Set  - 1 x daily - 7 x weekly - 5 sets - 10 reps - Seated Hip  Flexion March with Ankle Weights  - 1 x daily - 7 x weekly - 3 sets - 10 reps   ASSESSMENT:   CLINICAL IMPRESSION: Ms Anand is making improvements in walking during the week.  Was able to complete 20 minutes on TM at 13 RPE( somewhat hard)   LEFS 12-24-21 59/80 73.75%  which is improved and MET goal. Pt has one more visit to reinforce HEP.  Pt entered clinic without using cane today.  Pt tried trek pole which helps pt with balance and keeps posture upright.  Pt initially stated she has trouble getting her Left hip out of a car and uses her UE to assist. Pt was shown in clinic that she does not need to utilize her hand and to challenge herself for strength improvement. Pt with decreased pain from 5/10 to 3/10 today.   Her remaining goals are focused on gait and standing tolerance. Will continue to progress gait distance and RPE over remaining visits.   1 more visits approved before DC     OBJECTIVE IMPAIRMENTS decreased activity tolerance, decreased knowledge of use of DME, decreased mobility, difficulty walking, decreased ROM, decreased strength, postural dysfunction, obesity, and pain.    ACTIVITY  LIMITATIONS cleaning, driving, meal prep, laundry, and negotiating steps and walking .    PERSONAL FACTORS S/p nephrectomy 2003, Trichomonal infection. Foraminal stenosis of cervical region( receiving cortisone injections, chronic Left hip pain, OA of both hips, DDD, herniated Cervical disc are also affecting patient's functional outcome.      REHAB POTENTIAL: Good   CLINICAL DECISION MAKING: Evolving/moderate complexity   EVALUATION COMPLEXITY: Moderate     GOALS: Goals reviewed with patient? Yes   SHORT TERM GOALS: Target date: 11/10/2021   Independent with initial exercise Baseline: inconsistent with exercise from hospital due to pain 10-26-21 pt now stretching daily Goal status: ACHIEVED   2.  Pt will be able to walk without limp and AD Baseline: Pt with moderate abnormal antalgic gait  and SPC,  Pt now has new cane and walking in parking lot at home Status: 11/06/21: instructed pt in use of SPC instead of quad cane with significantly improved gait mechaincs for 6 MWT  Status: 12/03/21 : gait mechanics improving, still uses SPC.  Status 12-24-21  Pt enters clinic without cane, slight antalgic gait,  Goal status: PARTIALLY MET   3.  Pt will tolerate standing and walking for 20 min without increased pain in order to return washing dishes 5/10 or less Baseline: unable to wash dishes due to pain and decreased ability to stand greater than 15 min 10-26-21 needs to prop herself for 10 minutes to wash dishes Status: 11/06/21: pt reports no longer propping her self to complete washing Status: 12/03/21: able to wash dishes with min increased pain 15 minutes Status 12-24-21 able to walk 20 min on TM at 1.7 mph last 10 min and washing dishes 4-5/10 now Goal status: MET   4.  Pt will be able to perform sit to stand with regular chair without use of hands Baseline: 5 x STS 32 sec and must use hands ( normal 13 sec or less without use of hands ) Status: 10-26-21  5x sts 15.52 sec Status: 12/03/21: 5 x STS 14.8 sec  Goal status: MET      LONG TERM GOALS: Target date: 12/08/2021   Pt will be independent with advanced HEP Baseline: no knowledge, Initial HEP Goal status: ONGOING   2.  Pt will be able to incorporate home walking program without AD for 30 min 3 days a week Baseline: no walking program or regular exercise Status 11/19/21: pt is unable to walk more than 3 min 20 sec without stopping for standing rest break Status: 12/03/21: able to complete 6 minute walk test without stopping, using SPC, walks 5 minutes at home at most  Status: 12/17/21: is walking more often, 3 x this week 7-10 minutes  Goal status: ONGOING   3.  Pt will be able to stand for 30 minutes to do household chores like washing dishes and vacuuming with pain no greater than 3/10 Baseline: unable to do chores now and 8/10  with movement Status: 11/19/21: is washing dishes and making the bed, unable to vacum Status: 12/03/21: is washing dishes and vacumming, up to 15 min max at one time  Goal status: ONGOING   4.  LEFS score will improve to at least 50/80  Baseline: 10/80  12/03/21: 32/80 LEFS 12-24-21 59/80 73.75% Goal status: MET   5.  Pt will be able to negotiate steps with alternating gait pattern and pain 2/10 or less Baseline: Can only do step to pattern and use rails and cane for steps Status: 11/26/21 has 2 steps without  HR so uses step to pattern  12/03/21: uses step to pattern at home, can negotiate 12 stairs in clinic with alternating pattern and 1 HR, no increased pain Goal status: MET   6.  Ms Gueye will be able to don/doff clothing and socks without exacerbating pain Baseline: Pt needing help with donning and doffing clothes at eval Status: 11/26/21 pt can don and doff all clothing, shoes and socks independently.  Goal status: MET   PLAN: PT FREQUENCY: 1 x a week   PT DURATION:   6 weeks   PLANNED INTERVENTIONS: Therapeutic exercises, Therapeutic activity, Neuromuscular re-education, Balance training, Gait training, Patient/Family education, Joint mobilization, Stair training, DME instructions, Aquatic Therapy, Dry Needling, Cryotherapy, Moist heat, Taping, and Manual therapy manual e stim with DN if necessary   PLAN FOR NEXT SESSION:  gait distance, walking program  Pt will need to demo on treadmill for 20 min, use RPE (at least 13 RPE)   DC next visit LEFS   Check all possible CPT codes: [place] - Re-evaluation, 97110- Therapeutic Exercise, [phone]- Neuro Re-education, [phone] - Gait Training, [phone] - Manual Therapy, 97530 - Therapeutic Activities, 97535 - Self Care, and 97750 - Physical performance training     If treatment provided at initial evaluation, no treatment charged due to lack of authorization.        Voncille Lo, PT, [place] Certified Exercise Expert for the Aging Adult   12/24/21 11:46 AM Phone: [phone] Fax: [phone]

## 2021-12-24 ENCOUNTER — Ambulatory Visit: Payer: Medicaid Other | Admitting: Physical Therapy

## 2021-12-24 ENCOUNTER — Encounter: Payer: Self-pay | Admitting: Physical Therapy

## 2021-12-24 DIAGNOSIS — M25552 Pain in left hip: Secondary | ICD-10-CM | POA: Diagnosis not present

## 2021-12-24 DIAGNOSIS — R262 Difficulty in walking, not elsewhere classified: Secondary | ICD-10-CM

## 2021-12-24 DIAGNOSIS — M6281 Muscle weakness (generalized): Secondary | ICD-10-CM

## 2021-12-30 NOTE — Therapy (Signed)
OUTPATIENT PHYSICAL THERAPY TREATMENT NOTE/ Re-evaluation    PHYSICAL THERAPY DISCHARGE SUMMARY  Visits from Start of Care: 16  Current functional level related to goals / functional outcomes: As indicated below   Remaining deficits: As indicated below. Pt still has decreased endurance for doing household chores limited by pain 5/10 after 30 minutes. Pt accomplished goals for LE strength. Pt does complain of neck pain and has had MRI taken with no results as of this DC   Education / Equipment: HEP and community wellness opportunities   Patient agrees to discharge. Patient goals were partially met. Patient is being discharged due to meeting the stated rehab goals.  And being independent with HEP  for LE's  Patient Name: Kristin Montes MRN: [id number] DOB:1975/08/15, 46 y.o., female Today's Date: 12/31/2021  PCP: Dorna Mai, MD REFERRING PROVIDER: Dorna Mai, MD  END OF SESSION:   PT End of Session - 12/31/21 1015     Visit Number 16    Number of Visits 24    Date for PT Re-Evaluation 01/14/22    Authorization Type [employer] No vaso, ionto, estim or traction    Authorization Time Period 10/14/21-11/28/21 (12 visits) , 12/04/21-01/02/22 (4 visits)    PT Start Time 1016    PT Stop Time 1100    PT Time Calculation (min) 44 min    Activity Tolerance Patient tolerated treatment well    Behavior During Therapy WFL for tasks assessed/performed                  Past Medical History:  Diagnosis Date   Arthritis    Complication of anesthesia    slow to wake up with nephrectomy   Depression    Headache    History of kidney stones    Pneumonia    Past Surgical History:  Procedure Laterality Date   HERNIA REPAIR     NEPHRECTOMY     TOTAL HIP ARTHROPLASTY Left 08/18/2021   Procedure: TOTAL HIP ARTHROPLASTY ANTERIOR APPROACH;  Surgeon: Paralee Cancel, MD;  Location: [facility];  Service: Orthopedics;  Laterality: Left;   TUBAL LIGATION     Patient Active Problem  List   Diagnosis Date Noted   S/P total left hip arthroplasty 08/18/2021   DDD (degenerative disc disease), cervical 07/28/2021   Osteoarthritis of hip 06/13/2021   Chronic left hip pain 05/19/2021   Foraminal stenosis of cervical region 01/23/2021   Trichomonal infection 12/17/2020   Herniated cervical disc 09/28/2020   S/p nephrectomy 03/16/2013    REFERRING DIAG: D98.338 (ICD-10-CM) - S/P total left hip arthroplasty  THERAPY DIAG:  Pain in left hip  Difficulty in walking, not elsewhere classified  Muscle weakness (generalized)  Cervicalgia  Cramp and spasm  PERTINENT HISTORY: S/p nephrectomy 2003, Trichomonal infection. Foraminal stenosis of cervical region( receiving cortisone injections, chronic Left hip pain, OA of both hips, DDD, herniated Cervical disc  PRECAUTIONS: Anterior Hip Precautians  WEIGHT BEARING RESTRICTIONS Yes WBAT on L  FALLS:  Has patient fallen in last 6 months? Yes. Number of falls 3  fell before surgery but now feel like I am tripping up sometimes because of my L knee and my ankle  SUBJECTIVE:  I had an MRI for my neck.  I have not heard yet about results.  I feel good about my HEP.  I am going to do HEP at home but I want to join a [employer] after a while.  We have had so much rain and it made my  body hurt yesterday  PAIN:  Are you having pain? Are you having pain? 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 Dedicated CT or MRI could confirm. 08-18-21 IMPRESSION: POST OA dx No evidence of left hip arthroplasty complication.   PATIENT SURVEYS:  LEFS  10/80 LEFS 5-25/23 32/80 LEFS 12-24-21 59/80 73.75%   COGNITION:           Overall cognitive status: Within functional limits for tasks assessed                          SENSATION: Light touch: Impaired  numbness over incision   MUSCLE LENGTH: Hamstrings: Right 60 deg; Left 50 degmust have PT assist on L Thomas test: NT post surgery   POSTURE:  Left hip level lower than R   PALPATION: Well healed anterior hip scar for THA on L, TTP over Left gluteals down ITB and piriformis   LE ROM:   Active ROM Right 10/13/2021 Left 10/13/2021 Left  10/28/21 :Left  12/03/21 LEFT 12-31-21  Hip flexion 90 60 100 AROM 120 PROM 110 AROM  Hip extension _0 Hip abduction 38 20   25  Hip adduction         Hip internal rotation         Hip external rotation         Knee flexion 135/140 137/141   137  Knee extension 0 0   0  Ankle dorsiflexion         Ankle plantarflexion         Ankle inversion         Ankle eversion          (Blank rows = not tested)   LE MMT:    unable to perform SLR with Left leg 10-13-21   MMT Right 10/13/2021 Left 10/13/2021 Left  11/24/21 Left  12/03/21 LEFT  12-31-21  Hip flexion 4- 3- 4/5 4+/5 5  Hip extension 4- 3-     Hip abduction 4- 2+ 3+/5 4-/5 4  Hip adduction         Hip internal rotation 4- 3- 4-/5 4/5 4+  Hip external rotation 4- 3- 4-/5 4-/5 4  Knee flexion 4+ 4     Knee extension 4 4     Ankle dorsiflexion         Ankle plantarflexion         Ankle inversion         Ankle eversion          (Blank rows = not tested)   LOWER EXTREMITY SPECIAL TESTS:  NT due to post L anterior THA   FUNCTIONAL TESTS:  5 times sit to stand: 32 sec  pain and must use hands  10-26-21 15.52 sec 5 x STS 5 x STS 11.55 sec 6 minute walk test: 11/06/21:629 feet    6 MWT 986 ft without cane: 11/10/21 6 minute walk test: 12/03/21: 1037 feet   Walks on Treadmill 20 min and now daily walks   GAIT: Distance walked: 120 feet Assistive device utilized: Single  point  cane Level of assistance: Complete Independence Comments: Pt with moderate difficulty and antalgic gait due to Left LE weakness and pain at 8/10.  Pt did negotiate steps with step to gait and use of cane and handrail       TODAY'S TREATMENT: [facility] Adult PT Treatment:                                                DATE: 12-31-21 Therapeutic Exercise: Long Sitting Quad Set  1 x 10 reps Long Arc Quad 10 reps  Sidelying Hip Abduction for Left hip 10 reps Sit to stand with sink support Movement snack   1 x 10 with 25 # KB and then 3 x Standing Hip Extension with Resistance at Ankles and Counter Support  R and L 2 x 10 with GTB  Standing Hip Abduction with Resistance at Ankles and Counter Support  R and L  2 x 10 with GTB Standing Hip Flexion with Resistance Loop  R and L 2 x 10 reps GTB Side Stepping with Resistance at Feet   15 feet x 2 Reverse lunge holding onto counter  - 5 x on L and  on R with UE support  Forward Step Up with Counter Support  6 in - 2 x 10 R and L with UE support  Self Care: [employer] opportunities and updated HEP for home use for progressive strength  [facility] Adult PT Treatment:                                                DATE: 12-24-21    Therapeutic Exercise: Gait  20 minutes-see below Bilat heel raises utliziing trek pole in R UE  Standing resisted hip flexion with RTB 2 x 10 Standing and raising Left Hip flexion over 17 inch block 2 x 5 each.  2 x 5 with 3 lb Therapeutic Activity-   Rolling to right and left on mat x 3  Transfer/simulate in and out of car with hip flexion and no UE support Gait: T.M gait with cues for step length and heel strike- used 1 UE support today, 1.3 (increased to 1.5 mph at 5 minutes, at 10 min 1.7 mph after 15 min 1.8 ) MPH for 20 minutes total (RPE 11 after 5 min, 12 at 10 min then 13-somewhat hard,  in last 5 minutes) trekking pole, 300 feet -   [facility] Adult PT Treatment:                                                DATE:  12/17/21 Therapeutic Exercise: Nustep L4 LE only x 5 min Gait  20 minutes-see below Bilat heel raises Standing gastroc and hip flexor stretches  Alternating stairs up and down 12 steps with 1 HR Retro step ups 4 inch x 10 left 1 HR  Gait: T.M gait with cues for step length and heel strike- used 1 UE support today, 1.3 (increased to 1.5 mph at 10 minutes) MPH for 20 minutes total (RPE 11, then 13-somewhat hard,  in last 10 minutes) Pt  remained in closed chain for 37 minutes during treatment today Trial of trekking pole, 150 feet -   [facility] Adult PT Treatment:                                                DATE: 12/08/21 Therapeutic Exercise: Nustep L4 LE only x 6 min Tandem stance 60 sec each  Bilat heel raises  SLS 20 sec left , 23 right  Hip flexor stretch  Gait: T.M gait with cues for step length and heel strike- needs bilat UE, 1.2 MPH for 10 minutes   [facility] Adult PT Treatment:                                                DATE: 12/03/21 Therapeutic Activity/Re-eval Nustep L4 LE only x 6 min 6MWT Stairs 12 stairs alternating single HR Objective measures and subjective reports of functional progress reviewed with patient.   [facility] Adult PT Treatment:                                                DATE: 12/01/21 Therapeutic Exercise: Nustep L4 LE only x 6 min Manual therapy Hip flexor stretch Bridge x15 Side hip abduction x 15  6 inch step up x 15  left 6 inch lateral step up left x15  SLS 15 sec left, tandem stance >30 sec  Sit-stand from standard chair 25# 10 x 2   Manual IASTM to left anterior thigh   [facility] Adult PT Treatment:                                                DATE: 11/26/21 Therapeutic Exercise: Nustep L4 LE only x 6 min Bilat heel raises Standing gastroc stretch bilat  6 inch step up x 15  left 6 inch lateral step up left x15 6 inch alternating and unilateral step taps Standing 2 way hip red band 10 x 3 each Side hip abduction x 10  Bridge x 15 x 2   with green band  Sit-stand from standard chair 25# 10 x 2  Left hip flexor stretch      PATIENT EDUCATION:  Education details: POC, explanation of findings , HEP issued , LEFS explanation 10/80, HEP given and updated Person educated: Patient Education method: Explanation, Demonstration, Tactile cues, Verbal cues, and Handouts Education comprehension: verbalized understanding, returned demonstration, verbal cues required, tactile cues required, and needs further education     HOME EXERCISE PROGRAM: Access Code: [email or web address] URL: [email or web address] Date: 12/31/2021 updated  Prepared by: Pollock Sitting Quad Set  - 1 x daily - 7 x weekly - 5 sets - 10 reps - Long Arc Quad  - 1 x daily - 7 x weekly - 3 sets - 10 reps - Sidelying Hip Abduction  - 1 x daily - 7 x weekly - 3 sets - 10 reps - Sit to stand with sink support Movement snack  -  1 x daily - 7 x weekly - 3 sets - 10 reps - Standing Hip Extension with Resistance at Ankles and Counter Support  - 1 x daily - 7 x weekly - 3 sets - 10 reps - Standing Hip Abduction with Resistance at Ankles and Counter Support  - 1 x daily - 7 x weekly - 3 sets - 10 reps - Standing Hip Flexion with Resistance Loop  - 1 x daily - 7 x weekly - 3 sets - 10 reps - Side Stepping with Resistance at Feet  - 1 x daily - 7 x weekly - 3 sets - 10 reps - Reverse lunge holding onto counter  - 1 x daily - 7 x weekly - 3 sets - 10 reps - Forward Step Up with Counter Support  - 1 x daily - 7 x weekly - 3 sets - 10 reps   ASSESSMENT:   CLINICAL IMPRESSION: Ms Khiev is making improvements in walking during the week and reported that she was able to walk daily at least 15-20 min a day and working towards 30 min.    LEFS 12-24-21 59/80 73.75%  which is improved and MET goal.  5 x STS 11.55 sec indicating increased LE strength and decrease risk of fall.   Pt RX session spent upgrading HEP for home use and progressive strengthening  as she builds endurance.  Pt entered clinic without using cane today.   Pt now able to get in and out of her car better with  increased strength.  Pt pain level 5/10 due to what pt states is weather related /rainy cause in which she often feels more pain.  Pt does seem to be able to reduce pain with exercise. Pt is independent with HEP for home use and is ready for DC      OBJECTIVE IMPAIRMENTS decreased activity tolerance, decreased knowledge of use of DME, decreased mobility, difficulty walking, decreased ROM, decreased strength, postural dysfunction, obesity, and pain.    ACTIVITY LIMITATIONS cleaning, driving, meal prep, laundry, and negotiating steps and walking .    PERSONAL FACTORS S/p nephrectomy 2003, Trichomonal infection. Foraminal stenosis of cervical region( receiving cortisone injections, chronic Left hip pain, OA of both hips, DDD, herniated Cervical disc are also affecting patient's functional outcome.      REHAB POTENTIAL: Good   CLINICAL DECISION MAKING: Evolving/moderate complexity   EVALUATION COMPLEXITY: Moderate     GOALS: Goals reviewed with patient? Yes   SHORT TERM GOALS: Target date: 11/10/2021   Independent with initial exercise Baseline: inconsistent with exercise from hospital due to pain 10-26-21 pt now stretching daily Goal status: ACHIEVED   2.  Pt will be able to walk without limp and AD Baseline: Pt with moderate abnormal antalgic gait and SPC,  Pt now has new cane and walking in parking lot at home Status: 11/06/21: instructed pt in use of SPC instead of quad cane with significantly improved gait mechaincs for 6 MWT  Status: 12/03/21 : gait mechanics improving, still uses SPC.  Status 12-24-21  Pt enters clinic without cane, slight antalgic gait,  Goal status: PARTIALLY MET   3.  Pt will tolerate standing and walking for 20 min without increased pain in order to return washing dishes 5/10 or less Baseline: unable to wash dishes due to pain and  decreased ability to stand greater than 15 min 10-26-21 needs to prop herself for 10 minutes to wash dishes Status: 11/06/21: pt reports no longer propping her self to complete  washing Status: 12/03/21: able to wash dishes with min increased pain 15 minutes Status 12-24-21 able to walk 20 min on TM at 1.7 mph last 10 min and washing dishes 4-5/10 now Goal status: MET   4.  Pt will be able to perform sit to stand with regular chair without use of hands Baseline: 5 x STS 32 sec and must use hands ( normal 13 sec or less without use of hands ) Status: 10-26-21  5x sts 15.52 sec Status: 12/03/21: 5 x STS 14.8 sec  Goal status: MET      LONG TERM GOALS: Target date: 12/08/2021   Pt will be independent with advanced HEP Baseline: no knowledge, Initial HEP Goal status: MET   2.  Pt will be able to incorporate home walking program without AD for 30 min 3 days a week Baseline: no walking program or regular exercise Status 11/19/21: pt is unable to walk more than 3 min 20 sec without stopping for standing rest break Status: 12/03/21: able to complete 6 minute walk test without stopping, using SPC, walks 5 minutes at home at most  Status: 12/17/21: is walking more often, 3 x this week 7-10 minutes  Status 12-31-21  walking every morning for at least 15-28 min.  I did do one day for 30 minutes Goal status: MET   3.  Pt will be able to stand for 30 minutes to do household chores like washing dishes and vacuuming with pain no greater than 3/10 Baseline: unable to do chores now and 8/10 with movement Status: 11/19/21: is washing dishes and making the bed, unable to vacum Status: 12/03/21: is washing dishes and vacumming, up to 15 min max at one time  Status 12-31-21  30 minutes cooking, dishes , vacuuming. 5/10 Goal status: Partially MET   4.  LEFS score will improve to at least 50/80  Baseline: 10/80  12/03/21: 32/80 LEFS 12-24-21 59/80 73.75% Goal status: MET   5.  Pt will be able to negotiate steps with  alternating gait pattern and pain 2/10 or less Baseline: Can only do step to pattern and use rails and cane for steps Status: 11/26/21 has 2 steps without HR so uses step to pattern  12/03/21: uses step to pattern at home, can negotiate 12 stairs in clinic with alternating pattern and 1 HR, no increased pain Goal status: MET   6.  Ms Beneke will be able to don/doff clothing and socks without exacerbating pain Baseline: Pt needing help with donning and doffing clothes at eval Status: 11/26/21 pt can don and doff all clothing, shoes and socks independently.  Goal status: MET   PLAN: PT FREQUENCY: 1 x a week   PT DURATION:   6 weeks   PLANNED INTERVENTIONS: Therapeutic exercises, Therapeutic activity, Neuromuscular re-education, Balance training, Gait training, Patient/Family education, Joint mobilization, Stair training, DME instructions, Aquatic Therapy, Dry Needling, Cryotherapy, Moist heat, Taping, and Manual therapy manual e stim with DN if necessary   PLAN FOR NEXT SESSION:  gait distance, walking program  Pt will need to demo on treadmill for 20 min, use RPE (at least 13 RPE)   DC next visit LEFS   Check all possible CPT codes: [place] - Re-evaluation, 97110- Therapeutic Exercise, [phone]- Neuro Re-education, [phone] - Gait Training, [phone] - Manual Therapy, 97530 - Therapeutic Activities, 97535 - Self Care, and 97750 - Physical performance training     If treatment provided at initial evaluation, no treatment charged due to lack of authorization.  Voncille Lo PT Acute Rehab [phone]

## 2021-12-30 NOTE — Therapy (Incomplete Revision)
OUTPATIENT PHYSICAL THERAPY TREATMENT NOTE/ DISCHARGE NOTE    PHYSICAL THERAPY DISCHARGE SUMMARY  Visits from Start of Care: 16  Current functional level related to goals / functional outcomes: As indicated below   Remaining deficits: As indicated below. Pt still has decreased endurance for doing household chores limited by pain 5/10 after 30 minutes. Pt accomplished goals for LE strength. Pt does complain of neck pain and has had MRI taken with no results as of this DC   Education / Equipment: HEP and community wellness opportunities   Patient agrees to discharge. Patient goals were partially met. Patient is being discharged due to meeting the stated rehab goals.  And being independent with HEP  for LE's  Patient Name: Kristin Montes MRN: [id number] DOB:[birth year], 46 y.o., female Today's Date: 12/31/2021  PCP: Dorna Mai, MD REFERRING PROVIDER: Dorna Mai, MD  END OF SESSION:   PT End of Session - 12/31/21 1015     Visit Number 16    Number of Visits 24    Date for PT Re-Evaluation 01/14/22    Authorization Type [employer] No vaso, ionto, estim or traction    Authorization Time Period 10/14/21-11/28/21 (12 visits) , 12/04/21-01/02/22 (4 visits)    PT Start Time 1016    PT Stop Time 1100    PT Time Calculation (min) 44 min    Activity Tolerance Patient tolerated treatment well    Behavior During Therapy WFL for tasks assessed/performed                  Past Medical History:  Diagnosis Date   Arthritis    Complication of anesthesia    slow to wake up with nephrectomy   Depression    Headache    History of kidney stones    Pneumonia    Past Surgical History:  Procedure Laterality Date   HERNIA REPAIR     NEPHRECTOMY     TOTAL HIP ARTHROPLASTY Left 08/18/2021   Procedure: TOTAL HIP ARTHROPLASTY ANTERIOR APPROACH;  Surgeon: Paralee Cancel, MD;  Location: [facility];  Service: Orthopedics;  Laterality: Left;   TUBAL LIGATION     Patient Active Problem  List   Diagnosis Date Noted   S/P total left hip arthroplasty 08/18/2021   DDD (degenerative disc disease), cervical 07/28/2021   Osteoarthritis of hip 06/13/2021   Chronic left hip pain 05/19/2021   Foraminal stenosis of cervical region 01/23/2021   Trichomonal infection 12/17/2020   Herniated cervical disc 09/28/2020   S/p nephrectomy 03/16/2013    REFERRING DIAG: K09.381 (ICD-10-CM) - S/P total left hip arthroplasty  THERAPY DIAG:  Pain in left hip  Difficulty in walking, not elsewhere classified  Muscle weakness (generalized)  Cervicalgia  Cramp and spasm  PERTINENT HISTORY: S/p nephrectomy 2003, Trichomonal infection. Foraminal stenosis of cervical region( receiving cortisone injections, chronic Left hip pain, OA of both hips, DDD, herniated Cervical disc  PRECAUTIONS: Anterior Hip Precautians  WEIGHT BEARING RESTRICTIONS Yes WBAT on L  FALLS:  Has patient fallen in last 6 months? Yes. Number of falls 3  fell before surgery but now feel like I am tripping up sometimes because of my L knee and my ankle  SUBJECTIVE:  I had an MRI for my neck.  I have not heard yet about results.  I feel good about my HEP.  I am going to do HEP at home but I want to join a [employer] after a while.  We have had so much rain and it made  my body hurt yesterday  PAIN:  Are you having pain? Are you having pain? 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 Dedicated CT or MRI could confirm. 08-18-21 IMPRESSION: POST OA dx No evidence of left hip arthroplasty complication.   PATIENT SURVEYS:  LEFS  10/80 LEFS 5-25/23 32/80 LEFS 12-24-21 59/80 73.75%   COGNITION:           Overall cognitive status: Within functional limits for tasks assessed                          SENSATION: Light touch: Impaired  numbness over incision   MUSCLE LENGTH: Hamstrings: Right 60 deg; Left 50 degmust have PT assist on L Thomas test: NT post surgery   POSTURE:  Left hip level lower than R   PALPATION: Well healed anterior hip scar for THA on L, TTP over Left gluteals down ITB and piriformis   LE ROM:   Active ROM Right 10/13/2021 Left 10/13/2021 Left  10/28/21 :Left  12/03/21 LEFT 12-31-21  Hip flexion 90 60 100 AROM 120 PROM 110 AROM  Hip extension $RemoveBefor'30 5   10  'ayBpUcvwLbYE$ Hip abduction 38 20   25  Hip adduction         Hip internal rotation         Hip external rotation         Knee flexion 135/140 137/141   137  Knee extension 0 0   0  Ankle dorsiflexion         Ankle plantarflexion         Ankle inversion         Ankle eversion          (Blank rows = not tested)   LE MMT:    unable to perform SLR with Left leg 10-13-21   MMT Right 10/13/2021 Left 10/13/2021 Left  11/24/21 Left  12/03/21 LEFT  12-31-21  Hip flexion 4- 3- 4/5 4+/5 5  Hip extension 4- 3-     Hip abduction 4- 2+ 3+/5 4-/5 4  Hip adduction         Hip internal rotation 4- 3- 4-/5 4/5 4+  Hip external rotation 4- 3- 4-/5 4-/5 4  Knee flexion 4+ 4     Knee extension 4 4     Ankle dorsiflexion         Ankle plantarflexion         Ankle inversion         Ankle eversion          (Blank rows = not tested)   LOWER EXTREMITY SPECIAL TESTS:  NT due to post L anterior THA   FUNCTIONAL TESTS:  5 times sit to stand: 32 sec  pain and must use hands  10-26-21 15.52 sec 5 x STS 5 x STS 11.55 sec 6 minute walk test: 11/06/21:629 feet    6 MWT 986 ft without cane: 11/10/21 6 minute walk test: 12/03/21: 1037 feet   Walks on Treadmill 20 min and now daily walks   GAIT: Distance walked: 120 feet Assistive device utilized:  Single point  cane Level of assistance: Complete Independence Comments: Pt with moderate difficulty and antalgic gait due to Left LE weakness and pain at 8/10.  Pt did negotiate steps with step to gait and use of cane and handrail       TODAY'S TREATMENT: [facility] Adult PT Treatment:                                                DATE: 12-31-21 Therapeutic Exercise: Long Sitting Quad Set  1 x 10 reps Long Arc Quad 10 reps  Sidelying Hip Abduction for Left hip 10 reps Sit to stand with sink support Movement snack   1 x 10 with 25 # KB and then 3 x Standing Hip Extension with Resistance at Ankles and Counter Support  R and L 2 x 10 with GTB  Standing Hip Abduction with Resistance at Ankles and Counter Support  R and L  2 x 10 with GTB Standing Hip Flexion with Resistance Loop  R and L 2 x 10 reps GTB Side Stepping with Resistance at Feet   15 feet x 2 Reverse lunge holding onto counter  - 5 x on L and  on R with UE support  Forward Step Up with Counter Support  6 in - 2 x 10 R and L with UE support  Self Care: [employer] opportunities and updated HEP for home use for progressive strength  [facility] Adult PT Treatment:                                                DATE: 12-24-21    Therapeutic Exercise: Gait  20 minutes-see below Bilat heel raises utliziing trek pole in R UE  Standing resisted hip flexion with RTB 2 x 10 Standing and raising Left Hip flexion over 17 inch block 2 x 5 each.  2 x 5 with 3 lb Therapeutic Activity-   Rolling to right and left on mat x 3  Transfer/simulate in and out of car with hip flexion and no UE support Gait: T.M gait with cues for step length and heel strike- used 1 UE support today, 1.3 (increased to 1.5 mph at 5 minutes, at 10 min 1.7 mph after 15 min 1.8 ) MPH for 20 minutes total (RPE 11 after 5 min, 12 at 10 min then 13-somewhat hard,  in last 5 minutes) trekking pole, 300 feet -   [facility] Adult PT Treatment:                                                DATE:  12/17/21 Therapeutic Exercise: Nustep L4 LE only x 5 min Gait  20 minutes-see below Bilat heel raises Standing gastroc and hip flexor stretches  Alternating stairs up and down 12 steps with 1 HR Retro step ups 4 inch x 10 left 1 HR  Gait: T.M gait with cues for step length and heel strike- used 1 UE support today, 1.3 (increased to 1.5 mph at 10 minutes) MPH for 20 minutes total (RPE 11, then 13-somewhat hard,  in last 10 minutes)  Pt remained in closed chain for 37 minutes during treatment today Trial of trekking pole, 150 feet -   [facility] Adult PT Treatment:                                                DATE: 12/08/21 Therapeutic Exercise: Nustep L4 LE only x 6 min Tandem stance 60 sec each  Bilat heel raises  SLS 20 sec left , 23 right  Hip flexor stretch  Gait: T.M gait with cues for step length and heel strike- needs bilat UE, 1.2 MPH for 10 minutes   [facility] Adult PT Treatment:                                                DATE: 12/03/21 Therapeutic Activity/Re-eval Nustep L4 LE only x 6 min 6MWT Stairs 12 stairs alternating single HR Objective measures and subjective reports of functional progress reviewed with patient.   [facility] Adult PT Treatment:                                                DATE: 12/01/21 Therapeutic Exercise: Nustep L4 LE only x 6 min Manual therapy Hip flexor stretch Bridge x15 Side hip abduction x 15  6 inch step up x 15  left 6 inch lateral step up left x15  SLS 15 sec left, tandem stance >30 sec  Sit-stand from standard chair 25# 10 x 2   Manual IASTM to left anterior thigh   [facility] Adult PT Treatment:                                                DATE: 11/26/21 Therapeutic Exercise: Nustep L4 LE only x 6 min Bilat heel raises Standing gastroc stretch bilat  6 inch step up x 15  left 6 inch lateral step up left x15 6 inch alternating and unilateral step taps Standing 2 way hip red band 10 x 3 each Side hip abduction x 10  Bridge x 15 x 2   with green band  Sit-stand from standard chair 25# 10 x 2  Left hip flexor stretch      PATIENT EDUCATION:  Education details: POC, explanation of findings , HEP issued , LEFS explanation 10/80, HEP given and updated Person educated: Patient Education method: Explanation, Demonstration, Tactile cues, Verbal cues, and Handouts Education comprehension: verbalized understanding, returned demonstration, verbal cues required, tactile cues required, and needs further education     HOME EXERCISE PROGRAM: Access Code: [email or web address] URL: [email or web address] Date: 12/31/2021 updated  Prepared by: [place] Sitting Quad Set  - 1 x daily - 7 x weekly - 5 sets - 10 reps - Long Arc Quad  - 1 x daily - 7 x weekly - 3 sets - 10 reps - Sidelying Hip Abduction  - 1 x daily - 7 x weekly - 3 sets - 10 reps - Sit to stand with sink support Movement snack  -  1 x daily - 7 x weekly - 3 sets - 10 reps - Standing Hip Extension with Resistance at Ankles and Counter Support  - 1 x daily - 7 x weekly - 3 sets - 10 reps - Standing Hip Abduction with Resistance at Ankles and Counter Support  - 1 x daily - 7 x weekly - 3 sets - 10 reps - Standing Hip Flexion with Resistance Loop  - 1 x daily - 7 x weekly - 3 sets - 10 reps - Side Stepping with Resistance at Feet  - 1 x daily - 7 x weekly - 3 sets - 10 reps - Reverse lunge holding onto counter  - 1 x daily - 7 x weekly - 3 sets - 10 reps - Forward Step Up with Counter Support  - 1 x daily - 7 x weekly - 3 sets - 10 reps   ASSESSMENT:   CLINICAL IMPRESSION: Ms Khiev is making improvements in walking during the week and reported that she was able to walk daily at least 15-20 min a day and working towards 30 min.    LEFS 12-24-21 59/80 73.75%  which is improved and MET goal.  5 x STS 11.55 sec indicating increased LE strength and decrease risk of fall.   Pt RX session spent upgrading HEP for home use and progressive strengthening  as she builds endurance.  Pt entered clinic without using cane today.   Pt now able to get in and out of her car better with  increased strength.  Pt pain level 5/10 due to what pt states is weather related /rainy cause in which she often feels more pain.  Pt does seem to be able to reduce pain with exercise. Pt is independent with HEP for home use and is ready for DC      OBJECTIVE IMPAIRMENTS decreased activity tolerance, decreased knowledge of use of DME, decreased mobility, difficulty walking, decreased ROM, decreased strength, postural dysfunction, obesity, and pain.    ACTIVITY LIMITATIONS cleaning, driving, meal prep, laundry, and negotiating steps and walking .    PERSONAL FACTORS S/p nephrectomy 2003, Trichomonal infection. Foraminal stenosis of cervical region( receiving cortisone injections, chronic Left hip pain, OA of both hips, DDD, herniated Cervical disc are also affecting patient's functional outcome.      REHAB POTENTIAL: Good   CLINICAL DECISION MAKING: Evolving/moderate complexity   EVALUATION COMPLEXITY: Moderate     GOALS: Goals reviewed with patient? Yes   SHORT TERM GOALS: Target date: 11/10/2021   Independent with initial exercise Baseline: inconsistent with exercise from hospital due to pain 10-26-21 pt now stretching daily Goal status: ACHIEVED   2.  Pt will be able to walk without limp and AD Baseline: Pt with moderate abnormal antalgic gait and SPC,  Pt now has new cane and walking in parking lot at home Status: 11/06/21: instructed pt in use of SPC instead of quad cane with significantly improved gait mechaincs for 6 MWT  Status: 12/03/21 : gait mechanics improving, still uses SPC.  Status 12-24-21  Pt enters clinic without cane, slight antalgic gait,  Goal status: PARTIALLY MET   3.  Pt will tolerate standing and walking for 20 min without increased pain in order to return washing dishes 5/10 or less Baseline: unable to wash dishes due to pain and  decreased ability to stand greater than 15 min 10-26-21 needs to prop herself for 10 minutes to wash dishes Status: 11/06/21: pt reports no longer propping her self to complete  washing Status: 12/03/21: able to wash dishes with min increased pain 15 minutes Status 12-24-21 able to walk 20 min on TM at 1.7 mph last 10 min and washing dishes 4-5/10 now Goal status: MET   4.  Pt will be able to perform sit to stand with regular chair without use of hands Baseline: 5 x STS 32 sec and must use hands ( normal 13 sec or less without use of hands ) Status: 10-26-21  5x sts 15.52 sec Status: 12/03/21: 5 x STS 14.8 sec  Goal status: MET      LONG TERM GOALS: Target date: 12/08/2021   Pt will be independent with advanced HEP Baseline: no knowledge, Initial HEP Goal status: MET   2.  Pt will be able to incorporate home walking program without AD for 30 min 3 days a week Baseline: no walking program or regular exercise Status 11/19/21: pt is unable to walk more than 3 min 20 sec without stopping for standing rest break Status: 12/03/21: able to complete 6 minute walk test without stopping, using SPC, walks 5 minutes at home at most  Status: 12/17/21: is walking more often, 3 x this week 7-10 minutes  Status 12-31-21  walking every morning for at least 15-28 min.  I did do one day for 30 minutes Goal status: MET   3.  Pt will be able to stand for 30 minutes to do household chores like washing dishes and vacuuming with pain no greater than 3/10 Baseline: unable to do chores now and 8/10 with movement Status: 11/19/21: is washing dishes and making the bed, unable to vacum Status: 12/03/21: is washing dishes and vacumming, up to 15 min max at one time  Status 12-31-21  30 minutes cooking, dishes , vacuuming. 5/10 Goal status: Partially MET   4.  LEFS score will improve to at least 50/80  Baseline: 10/80  12/03/21: 32/80 LEFS 12-24-21 59/80 73.75% Goal status: MET   5.  Pt will be able to negotiate steps with  alternating gait pattern and pain 2/10 or less Baseline: Can only do step to pattern and use rails and cane for steps Status: 11/26/21 has 2 steps without HR so uses step to pattern  12/03/21: uses step to pattern at home, can negotiate 12 stairs in clinic with alternating pattern and 1 HR, no increased pain Goal status: MET   6.  Ms Beneke will be able to don/doff clothing and socks without exacerbating pain Baseline: Pt needing help with donning and doffing clothes at eval Status: 11/26/21 pt can don and doff all clothing, shoes and socks independently.  Goal status: MET   PLAN: PT FREQUENCY: 1 x a week   PT DURATION:   6 weeks   PLANNED INTERVENTIONS: Therapeutic exercises, Therapeutic activity, Neuromuscular re-education, Balance training, Gait training, Patient/Family education, Joint mobilization, Stair training, DME instructions, Aquatic Therapy, Dry Needling, Cryotherapy, Moist heat, Taping, and Manual therapy manual e stim with DN if necessary   PLAN FOR NEXT SESSION:  gait distance, walking program  Pt will need to demo on treadmill for 20 min, use RPE (at least 13 RPE)   DC next visit LEFS   Check all possible CPT codes: [place] - Re-evaluation, 97110- Therapeutic Exercise, [id number]- Neuro Re-education, [phone] - Gait Training, [phone] - Manual Therapy, 97530 - Therapeutic Activities, 97535 - Self Care, and 97750 - Physical performance training     If treatment provided at initial evaluation, no treatment charged due to lack of authorization.  Voncille Lo PT Acute Re[facility] [phone]

## 2021-12-31 ENCOUNTER — Ambulatory Visit: Payer: Medicaid Other | Admitting: Physical Therapy

## 2021-12-31 ENCOUNTER — Encounter: Payer: Self-pay | Admitting: Physical Therapy

## 2021-12-31 DIAGNOSIS — M25552 Pain in left hip: Secondary | ICD-10-CM | POA: Diagnosis not present

## 2021-12-31 DIAGNOSIS — R252 Cramp and spasm: Secondary | ICD-10-CM

## 2021-12-31 DIAGNOSIS — R262 Difficulty in walking, not elsewhere classified: Secondary | ICD-10-CM

## 2021-12-31 DIAGNOSIS — M542 Cervicalgia: Secondary | ICD-10-CM

## 2021-12-31 DIAGNOSIS — M6281 Muscle weakness (generalized): Secondary | ICD-10-CM

## 2022-01-04 DIAGNOSIS — M25551 Pain in right hip: Secondary | ICD-10-CM | POA: Insufficient documentation

## 2022-01-04 DIAGNOSIS — M25562 Pain in left knee: Secondary | ICD-10-CM | POA: Insufficient documentation

## 2022-01-05 DIAGNOSIS — M5412 Radiculopathy, cervical region: Secondary | ICD-10-CM | POA: Insufficient documentation

## 2022-01-28 ENCOUNTER — Other Ambulatory Visit: Payer: Self-pay | Admitting: Family Medicine

## 2022-02-01 DIAGNOSIS — M7671 Peroneal tendinitis, right leg: Secondary | ICD-10-CM | POA: Insufficient documentation

## 2022-02-01 DIAGNOSIS — M79671 Pain in right foot: Secondary | ICD-10-CM | POA: Insufficient documentation

## 2022-02-04 ENCOUNTER — Ambulatory Visit: Payer: Medicaid Other | Admitting: Family Medicine

## 2022-02-04 ENCOUNTER — Encounter: Payer: Self-pay | Admitting: Family Medicine

## 2022-02-04 VITALS — BP 130/89 | HR 70 | Temp 98.0°F | Resp 16 | Wt 158.0 lb

## 2022-02-04 DIAGNOSIS — M2550 Pain in unspecified joint: Secondary | ICD-10-CM | POA: Diagnosis not present

## 2022-02-04 DIAGNOSIS — F17200 Nicotine dependence, unspecified, uncomplicated: Secondary | ICD-10-CM

## 2022-02-04 DIAGNOSIS — F4321 Adjustment disorder with depressed mood: Secondary | ICD-10-CM | POA: Diagnosis not present

## 2022-02-05 LAB — ANA: Anti Nuclear Antibody (ANA): NEGATIVE

## 2022-02-05 LAB — CALCIUM: Calcium: 9.4 mg/dL (ref 8.7–10.2)

## 2022-02-05 LAB — VITAMIN D 25 HYDROXY (VIT D DEFICIENCY, FRACTURES): Vit D, 25-Hydroxy: 14.3 ng/mL — ABNORMAL LOW (ref 30.0–100.0)

## 2022-02-05 LAB — RHEUMATOID FACTOR: Rheumatoid fact SerPl-aCnc: <10 IU/mL (ref <14.0)

## 2022-02-05 LAB — SEDIMENTATION RATE: Sed Rate: 11 mm/hr (ref 0–32)

## 2022-02-06 ENCOUNTER — Encounter: Payer: Self-pay | Admitting: Family Medicine

## 2022-02-06 ENCOUNTER — Other Ambulatory Visit: Payer: Self-pay | Admitting: Family Medicine

## 2022-02-06 MED ORDER — VITAMIN D (ERGOCALCIFEROL) 1.25 MG (50000 UNIT) PO CAPS: 50000.0000 [IU] | ORAL_CAPSULE | ORAL | 0 refills | Status: DC

## 2022-02-06 NOTE — Progress Notes (Signed)
Established Patient Office Visit  Subjective    Patient ID: Kristin Montes, female    DOB: 02/16/1976  Age: 46 y.o. MRN: [id number]  CC:  Chief Complaint  Patient presents with   Follow-up     HPI Dawnmarie Breon presents for complaint of multiple joint pains that is persistent and worsening. She was seen by consultant who reports that her bone age seems also to be accelerated. .  Outpatient Encounter Medications as of 02/04/2022  Medication Sig   cyclobenzaprine (FLEXERIL) 10 MG tablet cyclobenzaprine 10 mg tablet  Take 1 tablet 3 times a day by oral route.   DULoxetine (CYMBALTA) 30 MG capsule Take 1 capsule by mouth twice daily   EYSUVIS 0.25 % SUSP Apply to eye.   HYDROcodone-acetaminophen (NORCO/VICODIN) 5-325 MG tablet every 12 (twelve) hours.   ibuprofen (ADVIL) 800 MG tablet Take 800 mg by mouth every 6 (six) hours as needed.   predniSONE (STERAPRED UNI-PAK 21 TAB) 5 MG (21) TBPK tablet Take by mouth.   RESTASIS 0.05 % ophthalmic emulsion 1 drop 2 (two) times daily.   SUMAtriptan (IMITREX) 50 MG tablet Take 1 tablet (50 mg total) by mouth every 2 (two) hours as needed for migraine. May repeat in 2 hours if headache persists or recurs.   aspirin 81 MG chewable tablet CHEW 1 TABLET (81 MG TOTAL) BY MOUTH 2 (TWO) TIMES DAILY FOR 28 DAYS.   diclofenac (VOLTAREN) 75 MG EC tablet Take 1 tablet twice a day by oral route.   [DISCONTINUED] docusate sodium (COLACE) 100 MG capsule Take 1 capsule (100 mg total) by mouth 2 (two) times daily.   No facility-administered encounter medications on file as of 02/04/2022.    Past Medical History:  Diagnosis Date   Arthritis    Complication of anesthesia    slow to wake up with nephrectomy   Depression    Headache    History of kidney stones    Pneumonia     Past Surgical History:  Procedure Laterality Date   HERNIA REPAIR     NEPHRECTOMY     TOTAL HIP ARTHROPLASTY Left 08/18/2021   Procedure: TOTAL HIP ARTHROPLASTY ANTERIOR APPROACH;   Surgeon: Durene Romans, MD;  Location: [facility];  Service: Orthopedics;  Laterality: Left;   TUBAL LIGATION      No family history on file.  Social History   Socioeconomic History   Marital status: Single    Spouse name: Not on file   Number of children: Not on file   Years of education: Not on file   Highest education level: Not on file  Occupational History   Not on file  Tobacco Use   Smoking status: Every Day    Packs/day: 0.25    Types: Cigarettes   Smokeless tobacco: Never   Tobacco comments:    6 cigs/day  Vaping Use   Vaping Use: Never used  Substance and Sexual Activity   Alcohol use: Not Currently   Drug use: Yes    Types: Marijuana    Comment: daily   Sexual activity: Not on file  Other Topics Concern   Not on file  Social History Narrative   Not on file   Social Determinants of Health   Financial Resource Strain: Not on file  Food Insecurity: Not on file  Transportation Needs: Not on file  Physical Activity: Not on file  Stress: Not on file  Social Connections: Not on file  Intimate Partner Violence: Not on file  Review of Systems  Musculoskeletal:  Positive for joint pain.  All other systems reviewed and are negative.       Objective    BP 130/89   Pulse 70   Temp 98 F (36.7 C) (Oral)   Resp 16   Wt 158 lb (71.7 kg)   SpO2 (!) 9%   BMI 27.12 kg/m   Physical Exam Vitals and nursing note reviewed.  Constitutional:      General: She is not in acute distress. Cardiovascular:     Rate and Rhythm: Normal rate and regular rhythm.  Pulmonary:     Effort: Pulmonary effort is normal.     Breath sounds: Normal breath sounds.  Musculoskeletal:        General: Tenderness (multiple joint) present.  Neurological:     General: No focal deficit present.     Mental Status: She is alert and oriented to person, place, and time.         Assessment & Plan:   1. Arthralgia, unspecified joint Monitoring labs ordered - Rheumatoid  factor - ANA - Calcium - Sedimentation Rate - Vitamin D, 25-hydroxy  2. Situational depression Cymbalta 30 mg prescribed  3. Smoking Discussed reduction/cessation    No follow-ups on file.   Tommie Raymond, MD

## 2022-02-22 DIAGNOSIS — T402X5A Adverse effect of other opioids, initial encounter: Secondary | ICD-10-CM | POA: Insufficient documentation

## 2022-03-14 IMAGING — XA DG HIP (WITH OR WITHOUT PELVIS) 1V*L*
1 series · 14 of 14 positions shown · non-contrast
Comparison: None.

CLINICAL DATA: Left anterior hip replacement.

EXAM:
DG HIP (WITH OR WITHOUT PELVIS) 1V*L*

[Series 1: unknown protocol · 14 of 14 slices shown]
[im 1/14]
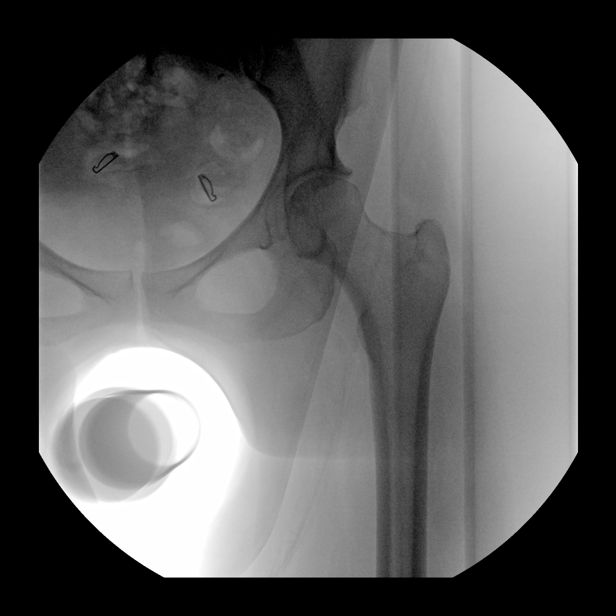
[im 2/14]
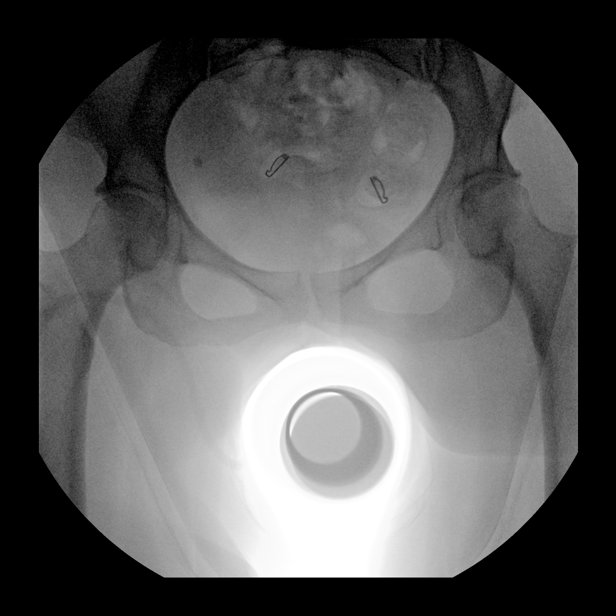
[im 3/14]
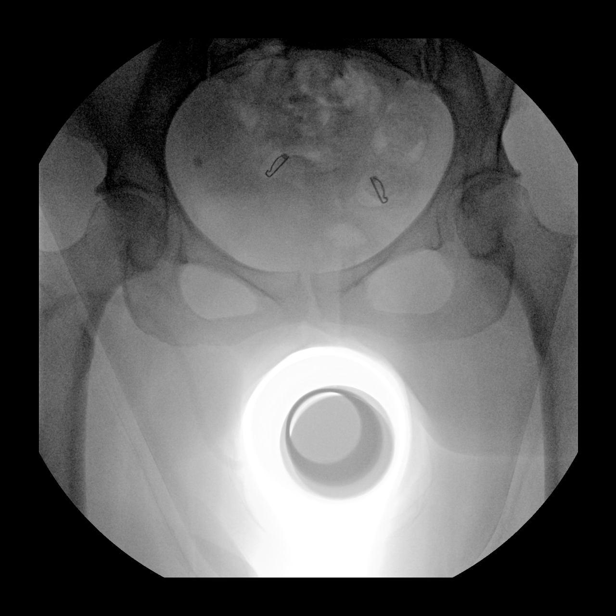
[im 4/14]
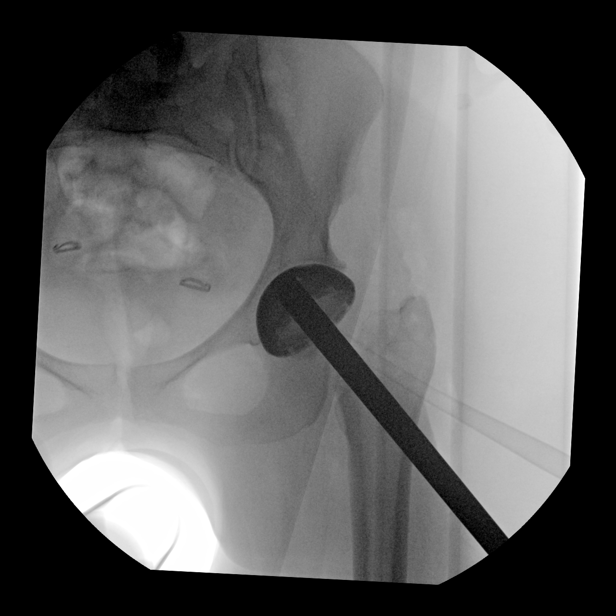
[im 5/14]
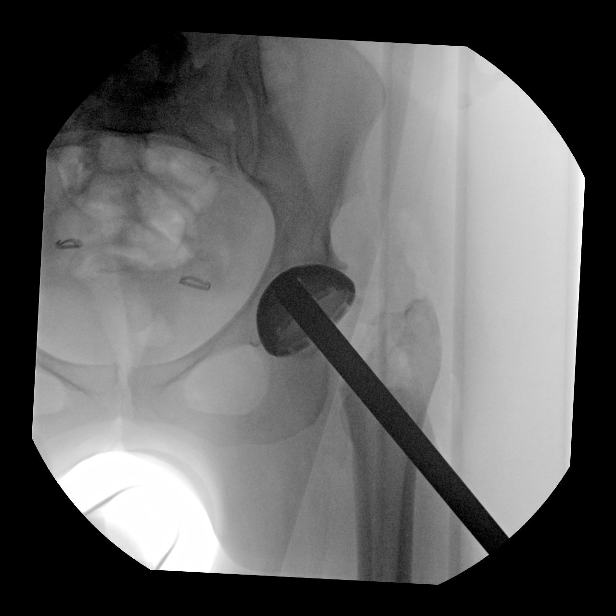
[im 6/14]
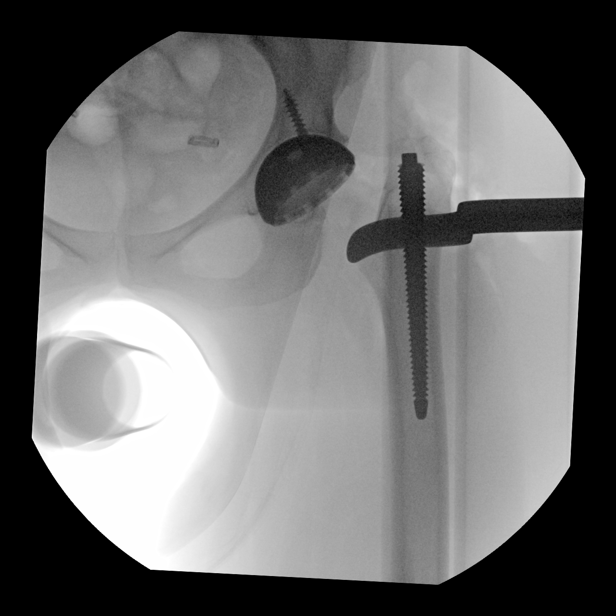
[im 7/14]
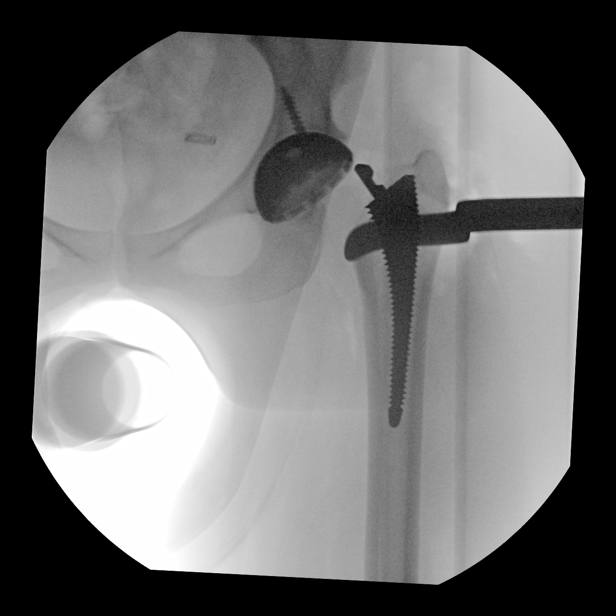
[im 8/14]
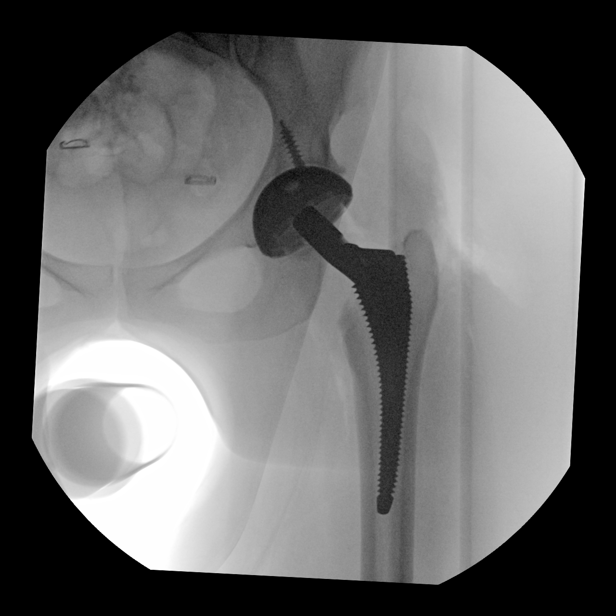
[im 9/14]
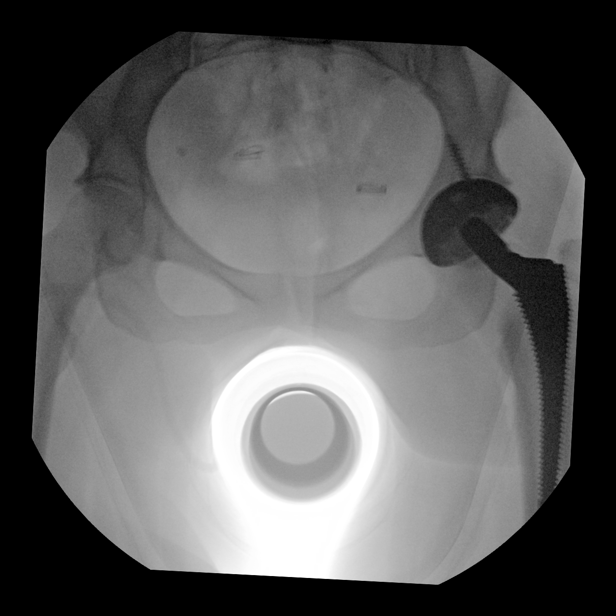
[im 10/14]
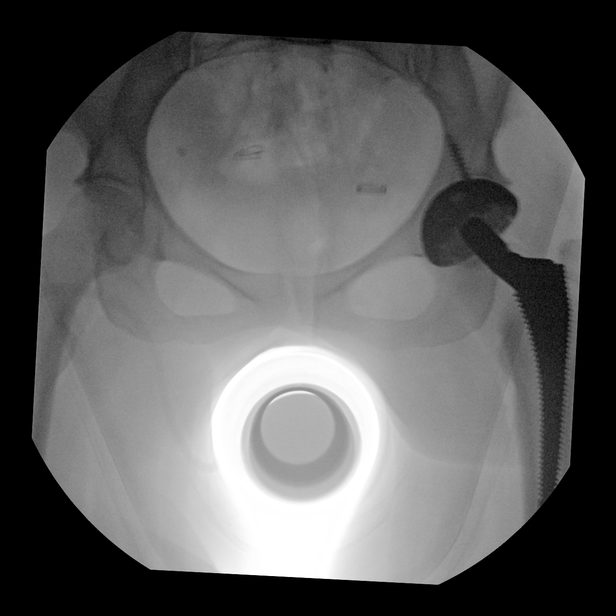
[im 11/14]
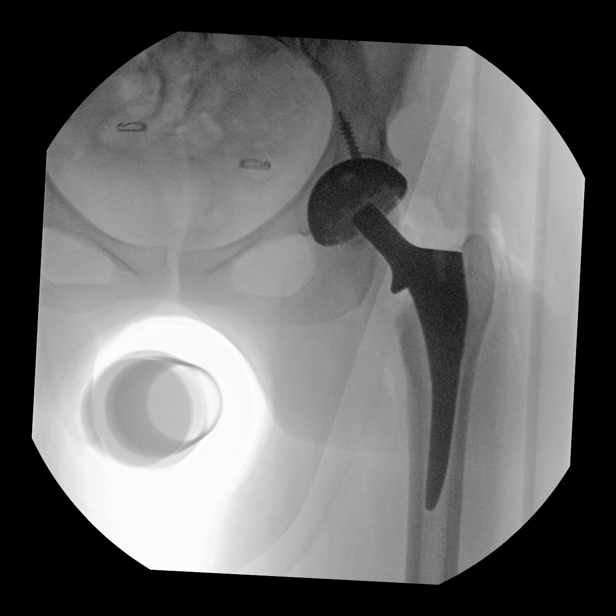
[im 12/14]
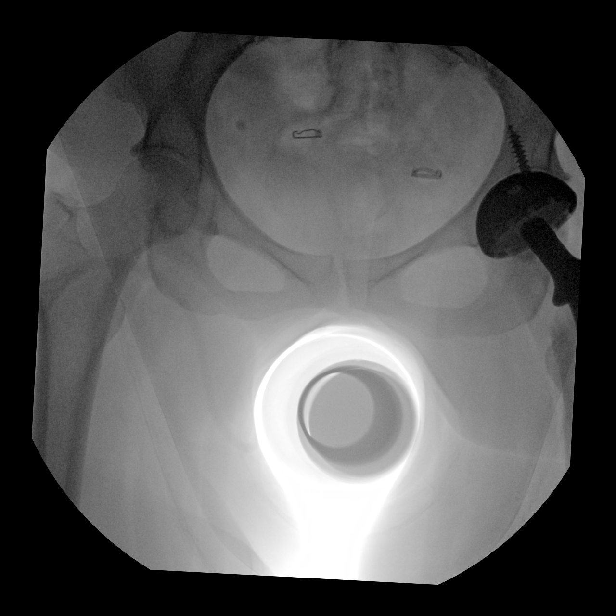
[im 13/14]
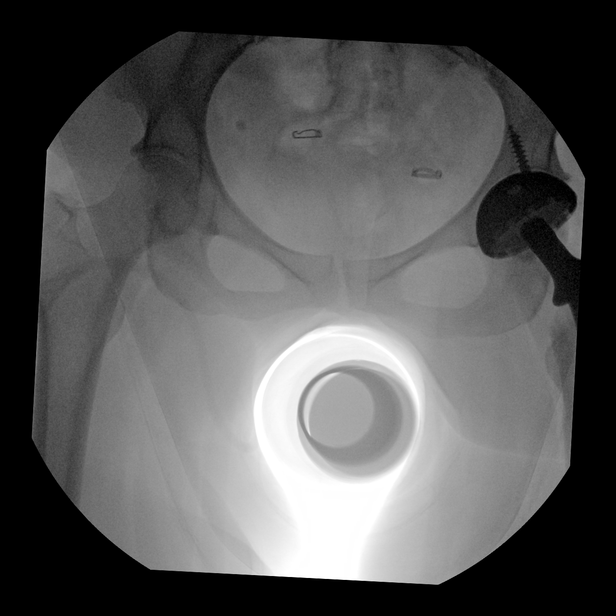
[im 14/14]
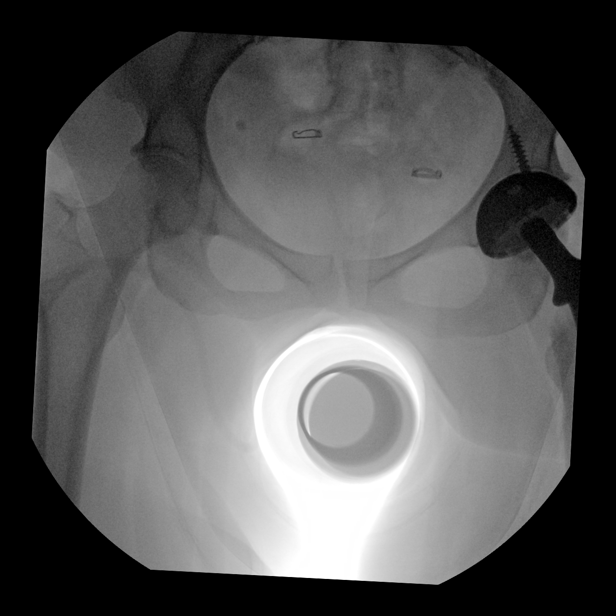

[14 of 14 positions shown; findings below may reference images not displayed]

FINDINGS: Fluoro time: 11 seconds.

Fourteen C-arm fluoroscopic images were obtained intraoperatively
and submitted for post operative interpretation. These images
demonstrate changes associated with left hip replacement. Final
provided images demonstrate femoral stem and acetabular components
in place without prosthetic femoral head. Please see the performing
provider's procedural report for further detail.
IMPRESSION: Intraoperative fluoroscopy, detailed above.

## 2022-03-14 IMAGING — DX DG PORTABLE PELVIS
1 series · 1 of 1 positions shown · non-contrast
Comparison: Radiograph 05/19/2021

CLINICAL DATA: Total hip arthroplasty

EXAM:
PORTABLE PELVIS 1-2 VIEWS

[pelvis ap]
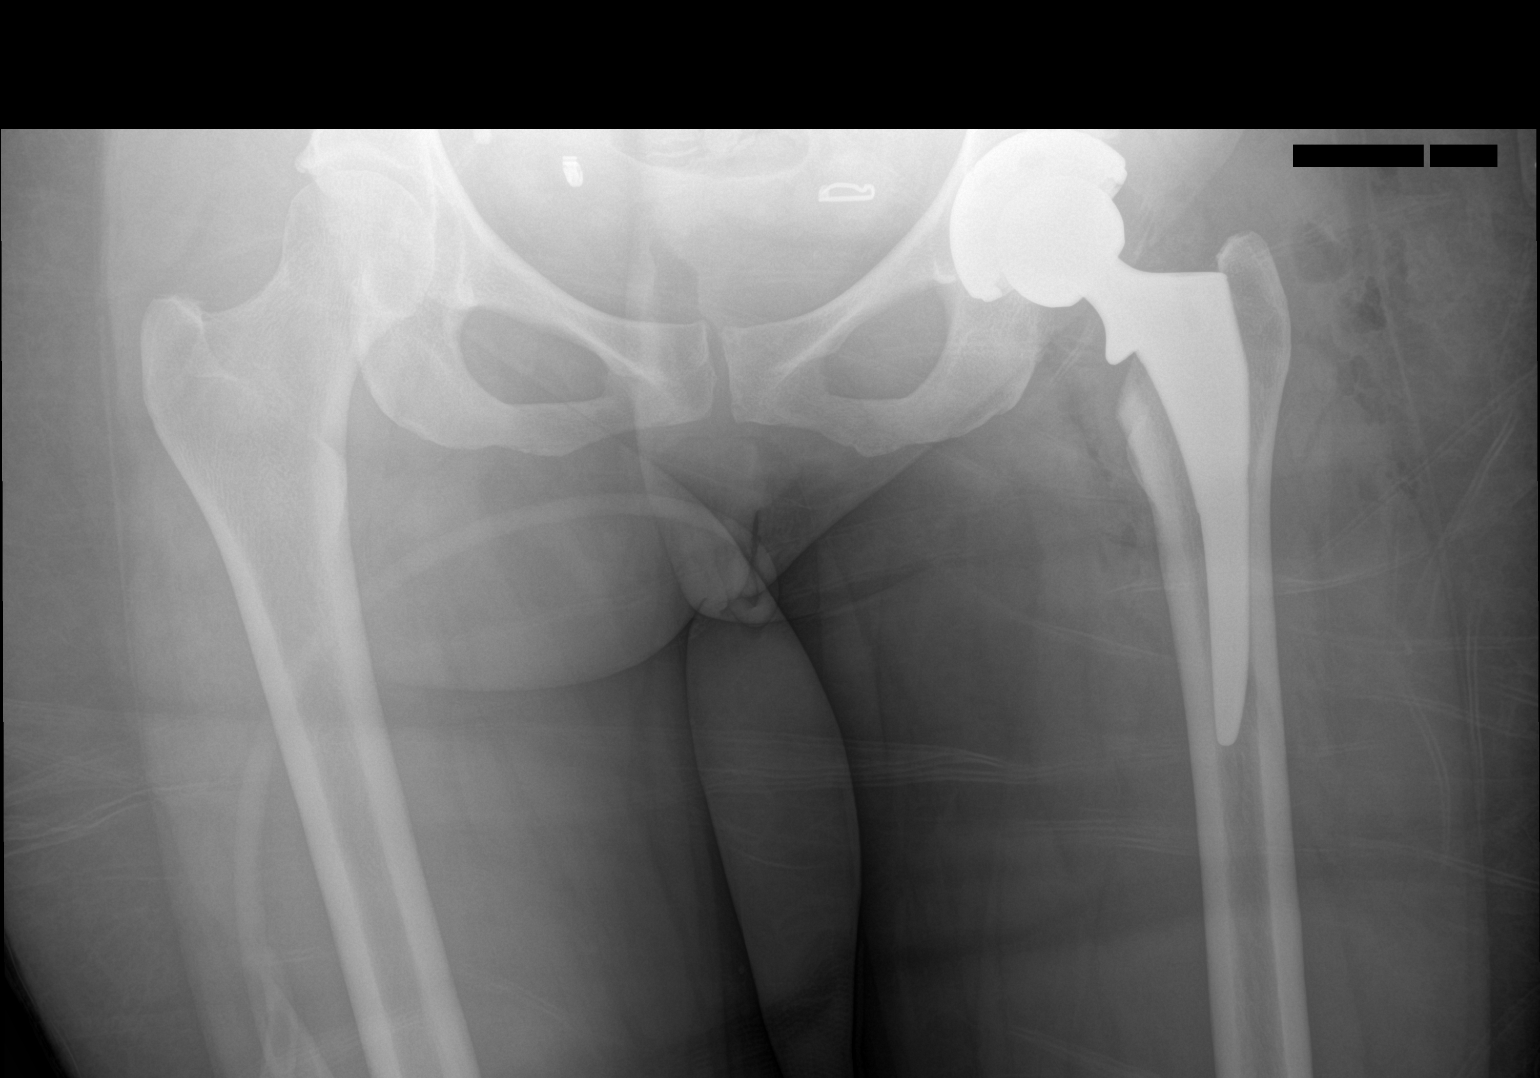

[1 of 1 positions shown; findings below may reference images not displayed]

FINDINGS: There is a new left total hip arthroplasty in normal alignment
without evidence of loosening or periprosthetic fracture. Expected
soft tissue changes. Mild right hip osteoarthritis.
IMPRESSION: No evidence of left hip arthroplasty complication.

## 2022-03-18 DIAGNOSIS — M47819 Spondylosis without myelopathy or radiculopathy, site unspecified: Secondary | ICD-10-CM | POA: Insufficient documentation

## 2022-03-18 DIAGNOSIS — M542 Cervicalgia: Secondary | ICD-10-CM | POA: Insufficient documentation

## 2022-03-18 DIAGNOSIS — M792 Neuralgia and neuritis, unspecified: Secondary | ICD-10-CM | POA: Insufficient documentation

## 2022-03-22 DIAGNOSIS — G8929 Other chronic pain: Secondary | ICD-10-CM | POA: Insufficient documentation

## 2022-03-23 DIAGNOSIS — M47816 Spondylosis without myelopathy or radiculopathy, lumbar region: Secondary | ICD-10-CM | POA: Insufficient documentation

## 2022-04-06 DIAGNOSIS — G629 Polyneuropathy, unspecified: Secondary | ICD-10-CM | POA: Insufficient documentation

## 2022-05-06 ENCOUNTER — Ambulatory Visit (INDEPENDENT_AMBULATORY_CARE_PROVIDER_SITE_OTHER): Payer: Medicaid Other | Admitting: Family Medicine

## 2022-05-06 VITALS — BP 145/89 | HR 75 | Temp 98.3°F | Resp 16 | Ht 64.02 in | Wt 160.0 lb

## 2022-05-06 DIAGNOSIS — M2550 Pain in unspecified joint: Secondary | ICD-10-CM | POA: Diagnosis not present

## 2022-05-06 DIAGNOSIS — E559 Vitamin D deficiency, unspecified: Secondary | ICD-10-CM

## 2022-05-06 MED ORDER — CYCLOBENZAPRINE HCL 10 MG PO TABS: ORAL_TABLET | ORAL | 1 refills | Status: DC

## 2022-05-06 NOTE — Progress Notes (Signed)
Pt presents for Vit D recheck -wants to know if provider can help with back brace or should she talk to Ortho  -wants to talk to provider about marijuana card  -needs refill on Flexeril

## 2022-05-07 LAB — VITAMIN D 25 HYDROXY (VIT D DEFICIENCY, FRACTURES): Vit D, 25-Hydroxy: 46.4 ng/mL (ref 30.0–100.0)

## 2022-05-10 ENCOUNTER — Encounter: Payer: Self-pay | Admitting: Family Medicine

## 2022-05-10 NOTE — Progress Notes (Signed)
Established Patient Office Visit  Subjective    Patient ID: Kristin Montes, female    DOB: [birth year]  Age: 46 y.o. MRN: [id number]  CC:  Chief Complaint  Patient presents with   Follow-up    HPI Kristin Montes presents for follow up of chronic med issues. Patient denies acute complaints.    Outpatient Encounter Medications as of 05/06/2022  Medication Sig   cyclobenzaprine (FLEXERIL) 10 MG tablet cyclobenzaprine 10 mg tablet  Take 1 tablet 3 times a day by oral route.   diclofenac (VOLTAREN) 75 MG EC tablet Take 1 tablet twice a day by oral route.   diclofenac Sodium (VOLTAREN) 1 % GEL Apply 2 g topically 4 (four) times daily.   DULoxetine (CYMBALTA) 30 MG capsule Take 1 capsule by mouth twice daily   EYSUVIS 0.25 % SUSP Apply to eye.   gabapentin (NEURONTIN) 300 MG capsule Take by mouth.   HYDROcodone-acetaminophen (NORCO) 7.5-325 MG tablet Take 1 tablet by mouth every 6 (six) hours.   ibuprofen (ADVIL) 800 MG tablet Take 800 mg by mouth every 6 (six) hours as needed.   predniSONE (STERAPRED UNI-PAK 21 TAB) 5 MG (21) TBPK tablet Take by mouth.   RESTASIS 0.05 % ophthalmic emulsion 1 drop 2 (two) times daily.   SUMAtriptan (IMITREX) 50 MG tablet Take 1 tablet (50 mg total) by mouth every 2 (two) hours as needed for migraine. May repeat in 2 hours if headache persists or recurs.   Vitamin D, Ergocalciferol, (DRISDOL) 1.25 MG (50000 UNIT) CAPS capsule Take 1 capsule (50,000 Units total) by mouth every 7 (seven) days.   [DISCONTINUED] aspirin 81 MG chewable tablet CHEW 1 TABLET (81 MG TOTAL) BY MOUTH 2 (TWO) TIMES DAILY FOR 28 DAYS.   [DISCONTINUED] cyclobenzaprine (FLEXERIL) 10 MG tablet cyclobenzaprine 10 mg tablet  Take 1 tablet 3 times a day by oral route.   [DISCONTINUED] HYDROcodone-acetaminophen (NORCO/VICODIN) 5-325 MG tablet every 12 (twelve) hours.   No facility-administered encounter medications on file as of 05/06/2022.    Past Medical History:  Diagnosis Date    Arthritis    Complication of anesthesia    slow to wake up with nephrectomy   Depression    Headache    History of kidney stones    Pneumonia     Past Surgical History:  Procedure Laterality Date   HERNIA REPAIR     NEPHRECTOMY     TOTAL HIP ARTHROPLASTY Left 08/18/2021   Procedure: TOTAL HIP ARTHROPLASTY ANTERIOR APPROACH;  Surgeon: Paralee Cancel, MD;  Location: [facility];  Service: Orthopedics;  Laterality: Left;   TUBAL LIGATION      No family history on file.  Social History   Socioeconomic History   Marital status: Single    Spouse name: Not on file   Number of children: Not on file   Years of education: Not on file   Highest education level: Not on file  Occupational History   Not on file  Tobacco Use   Smoking status: Every Day    Packs/day: 0.25    Types: Cigarettes   Smokeless tobacco: Never   Tobacco comments:    6 cigs/day  Vaping Use   Vaping Use: Never used  Substance and Sexual Activity   Alcohol use: Not Currently   Drug use: Yes    Types: Marijuana    Comment: daily   Sexual activity: Not on file  Other Topics Concern   Not on file  Social History Narrative   Not on  file   Social Determinants of Health   Financial Resource Strain: Not on file  Food Insecurity: Not on file  Transportation Needs: Not on file  Physical Activity: Not on file  Stress: Not on file  Social Connections: Not on file  Intimate Partner Violence: Not on file    Review of Systems  Musculoskeletal:  Positive for joint pain.  All other systems reviewed and are negative.       Objective    BP (!) 145/89 (BP Location: Left Arm, Patient Position: Sitting, Cuff Size: Normal)   Pulse 75   Temp 98.3 F (36.8 C)   Resp 16   Ht 5' 4.02" (1.626 m)   Wt 160 lb (72.6 kg)   SpO2 96%   BMI 27.45 kg/m   Physical Exam Vitals and nursing note reviewed.  Constitutional:      General: She is not in acute distress. Cardiovascular:     Rate and Rhythm: Normal rate and  regular rhythm.  Pulmonary:     Effort: Pulmonary effort is normal.     Breath sounds: Normal breath sounds.  Musculoskeletal:        General: Tenderness (multiple joint) present.  Neurological:     General: No focal deficit present.     Mental Status: She is alert and oriented to person, place, and time.         Assessment & Plan:   1. Arthralgia, unspecified joint Flexeril prescribed  2. Vitamin D deficiency Monitoring labs ordered - Vitamin D, 25-hydroxy    No follow-ups on file.   Tommie Raymond, MD

## 2022-05-13 ENCOUNTER — Ambulatory Visit: Payer: Medicaid Other | Admitting: Neurology

## 2022-05-13 ENCOUNTER — Encounter: Payer: Self-pay | Admitting: Neurology

## 2022-05-13 ENCOUNTER — Telehealth: Payer: Self-pay | Admitting: Neurology

## 2022-05-13 VITALS — BP 136/85 | HR 76 | Ht 65.0 in | Wt 161.0 lb

## 2022-05-13 DIAGNOSIS — G5601 Carpal tunnel syndrome, right upper limb: Secondary | ICD-10-CM

## 2022-05-13 DIAGNOSIS — M542 Cervicalgia: Secondary | ICD-10-CM | POA: Diagnosis not present

## 2022-05-13 DIAGNOSIS — G43009 Migraine without aura, not intractable, without status migrainosus: Secondary | ICD-10-CM

## 2022-05-13 DIAGNOSIS — F419 Anxiety disorder, unspecified: Secondary | ICD-10-CM

## 2022-05-13 MED ORDER — TOPIRAMATE 25 MG PO TABS: 25.0000 mg | ORAL_TABLET | Freq: Every evening | ORAL | 6 refills | Status: DC

## 2022-05-13 MED ORDER — RIZATRIPTAN BENZOATE 10 MG PO TBDP
10.0000 mg | ORAL_TABLET | ORAL | 3 refills | Status: AC | PRN
Start: 2022-05-13 — End: ?

## 2022-05-13 NOTE — Progress Notes (Signed)
[facility]  PATIENT: Kristin Montes DOB: [birth year]  REFERRING CLINICIAN: Dorna Mai, MD HISTORY FROM: Patient  REASON FOR VISIT: Headaches    HISTORICAL  CHIEF COMPLAINT:  Chief Complaint  Patient presents with   Follow-up    RM 12 alone here for 6 month f/u. Pt reports migraines have been the same. No improvements. Reports she did not tolerate the sumatriptan well she d/c this med and would like to discuss other options. She also reports worsening numbness/tingling/shaking in the right hand cramping of the had also noticeable. These are new sx since her last f/u with our office      INTERVAL HISTORY 05/13/22:  Patient presents today for follow-up, states that her headaches are still the same, denies any improvement.  She self discontinued the sumatriptan because it was not helpful and was actually making her feel bad.  She does complain of right hand weakness and numbness mainly on the first 3 fingers.  She cannot write as she used to do in the past. She reported she has been dropping her phone lately.  Her orthopedist surgeon has started her on gabapentin 300 mg nightly and it is helpful.   INTERVAL HISTORY 11/10/2021:  Patient presented for follow-up, at last visit in December she reported improvement of the headaches and sleep, at that time plan was to continue with amitriptyline and sumatriptan as needed for headache.  In the meantime patient did have hip surgery and during that time the amitriptyline was discontinued and patient was put on Lyrica.  When Lyrica was completed patient was not put back on amitriptyline.  She started having depressive symptoms, crying, follow-up with her primary care doctor who started her on duloxetine.  Since then she is reporting that her depressive symptoms are much better but she is having episode of severe headaches almost every 2 weeks and they can last up to 3 days associated with nausea, photophobia and phonophobia.  During  this time she is unable to function.  Nothing seems to help.  Her last headache.  Was last week.   INTERVAL HISTORY 06/24/2021: Patient presents today for follow-up, at last visit she was started on amitriptyline and sumatriptan as needed for headaches.  States her headaches are better but she still having neck pain.  She did have a ESI done and had relief for about 15 days.  Her cervical type pain is located in the back of her neck.  Stated that she has been referred to a spine doctor, she has not seen a the surgeon yet.  She is also scheduled for a hip replacement in February.  She had increased her amitriptyline to 50 mg nightly, will continue the same dose.  I also advised her that that the sumatriptan may not be beneficial for cervicalgia type pain, she can continue with the Aleve/Motrin and other muscle relaxant.  I will see her in 6 months for follow-up.     HISTORY OF PRESENT ILLNESS:  This is a 46 year old woman with past medical history of cervicalgia who is presenting for new onset headache.  Patient stated that she said she has been having terrible headache started for the past 5 months.  Patient describes headache as sharp pain located on the top of the head, she has tried Tylenol and Aleve and is not working at this moment.  Sometimes the headache can be so painful that it will bring tears to my eye, on average she will have 5 headache days per week and the  headache can last the whole day.  She reported this pain is totally different from cervical hip pain she is being followed by pain management and she is scheduled to have a ESI next week.  She denies any family history of migraines, denies any photophobia or phonophobia, no nausea no vomiting.  However she does say sometimes with a headache she may have slight blurred vision   Headache History and Characteristics: Onset: 5 months ago Location: Top of head Quality: Sharp  Intensity: 10/10.  Duration: Can last the whole day Migrainous  Features: None   Aura: No  History of brain injury or tumor: No  Family history: None  Motion sickness: no Cardiac history: no  OTC: tylenol, aleve  Caffeine: coffee Sleep: Not good, getting 4 hrs, will wake up in the middle of the night and unable to fall back asleep  Mood/ Stress: Very stress because of her cervical pain and she is out of work since March   Prior prophylaxis: Propranolol: No  Verapamil:No TCA: No Topamax: No Depakote: No Effexor: No Cymbalta: No Neurontin:No  Prior abortives: Triptan: No Anti-emetic: No Steroids: No Ergotamine suppository: No  Prior interventions: None   OTHER MEDICAL CONDITIONS: Cervicalgia    REVIEW OF SYSTEMS: Full 14 system review of systems performed and negative with exception of: as noted in the HPI  ALLERGIES: No Known Allergies  HOME MEDICATIONS: Outpatient Medications Prior to Visit  Medication Sig Dispense Refill   cyclobenzaprine (FLEXERIL) 10 MG tablet cyclobenzaprine 10 mg tablet  Take 1 tablet 3 times a day by oral route. 90 tablet 1   diclofenac (VOLTAREN) 75 MG EC tablet Take 1 tablet twice a day by oral route.     DULoxetine (CYMBALTA) 30 MG capsule Take 1 capsule by mouth twice daily 180 capsule 0   EYSUVIS 0.25 % SUSP Apply to eye.     gabapentin (NEURONTIN) 300 MG capsule Take by mouth.     HYDROcodone-acetaminophen (NORCO) 7.5-325 MG tablet Take 1 tablet by mouth every 6 (six) hours.     RESTASIS 0.05 % ophthalmic emulsion 1 drop 2 (two) times daily.     diclofenac Sodium (VOLTAREN) 1 % GEL Apply 2 g topically 4 (four) times daily. (Patient not taking: Reported on 05/13/2022)     ibuprofen (ADVIL) 800 MG tablet Take 800 mg by mouth every 6 (six) hours as needed. (Patient not taking: Reported on 05/13/2022)     predniSONE (STERAPRED UNI-PAK 21 TAB) 5 MG (21) TBPK tablet Take by mouth. (Patient not taking: Reported on 05/13/2022)     SUMAtriptan (IMITREX) 50 MG tablet Take 1 tablet (50 mg total) by mouth every 2  (two) hours as needed for migraine. May repeat in 2 hours if headache persists or recurs. (Patient not taking: Reported on 05/13/2022) 10 tablet 3   Vitamin D, Ergocalciferol, (DRISDOL) 1.25 MG (50000 UNIT) CAPS capsule Take 1 capsule (50,000 Units total) by mouth every 7 (seven) days. (Patient not taking: Reported on 05/13/2022) 12 capsule 0   No facility-administered medications prior to visit.    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 No pertinent family history.  SOCIAL HISTORY: Social History   Socioeconomic History   Marital status: Single    Spouse name: Not on file   Number of children: Not on file   Years of education: Not on file   Highest education level: Not on file  Occupational History   Not on file  Tobacco Use   Smoking status: Every Day    Packs/day: 0.25    Types: Cigarettes   Smokeless tobacco: Never   Tobacco comments:    6 cigs/day  Vaping Use   Vaping Use: Never used  Substance and Sexual Activity   Alcohol use: Not Currently   Drug use: Yes    Types: Marijuana    Comment: daily   Sexual activity: Not on file  Other Topics Concern   Not on file  Social History Narrative   Not on file   Social Determinants of Health   Financial Resource Strain: Not on file  Food Insecurity: Not on file  Transportation Needs: Not on file  Physical Activity: Not on file  Stress: Not on file  Social Connections: Not on file  Intimate Partner  Violence: Not on file     PHYSICAL EXAM  GENERAL EXAM/CONSTITUTIONAL: Vitals:  Vitals:   05/13/22 1343  BP: 136/85  Pulse: 76  Weight: 161 lb (73 kg)  Height: 5\' 5"  (1.651 m)   Body mass index is 26.79 kg/m. Wt Readings from Last 3 Encounters:  05/13/22 161 lb (73 kg)  05/06/22 160 lb (72.6 kg)  02/04/22 158 lb (71.7 kg)   Patient is in no distress; well developed, nourished and groomed; neck is supple  EYES: Pupils round and reactive to light, Visual fields full to confrontation, Extraocular movements intacts,   MUSCULOSKELETAL: Gait, strength, tone, movements noted in Neurologic exam below  NEUROLOGIC: MENTAL STATUS:  awake, alert, oriented to person, place and time recent and remote memory intact normal attention and concentration language fluent, comprehension intact, naming intact fund of knowledge appropriate  CRANIAL NERVE:  2nd, 3rd, 4th, 6th - pupils equal and reactive to light, visual fields full to confrontation, extraocular muscles intact, no nystagmus 5th - facial sensation symmetric 7th - facial strength symmetric 8th - hearing intact 9th - palate elevates symmetrically, uvula midline 11th - shoulder shrug symmetric 12th - tongue protrusion midline  MOTOR:  normal bulk and tone, full strength in the BUE, BLE  SENSORY:  normal and symmetric to light touch  COORDINATION:  finger-nose-finger, fine finger movements normal  REFLEXES:  deep tendon reflexes present and symmetric  GAIT/STATION:  normal   DIAGNOSTIC DATA (LABS, IMAGING, TESTING) - I reviewed patient records, labs, notes, testing and imaging myself where available.  Lab Results  Component Value Date   WBC 9.3 08/19/2021   HGB 10.9 (L) 08/19/2021   HCT 33.1 (L) 08/19/2021   MCV 96.2 08/19/2021   PLT 199 08/19/2021      Component Value Date/Time   NA 138 08/19/2021 0306   K 4.0 08/19/2021 0306   CL 103 08/19/2021 0306   CO2 26 08/19/2021 0306   GLUCOSE 130 (H)  08/19/2021 0306   BUN 13 08/19/2021 0306   CREATININE 0.71 08/19/2021 0306   CALCIUM 9.4 02/04/2022 1112   PROT 7.0 08/14/2021 0946  ALBUMIN 3.8 08/14/2021 0946   AST 25 08/14/2021 0946   ALT 33 08/14/2021 0946   ALKPHOS 106 08/14/2021 0946   BILITOT 0.3 08/14/2021 0946   GFRNONAA >60 08/19/2021 0306   GFRAA >90 12/16/2013 1440   No results found for: "CHOL", "HDL", "LDLCALC", "LDLDIRECT", "TRIG", "CHOLHDL" No results found for: "HGBA1C" No results found for: "VITAMINB12" No results found for: "TSH"   MRI Brain 04/25/2021  1.   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  This is most likely due to a remote stroke or remote trauma.  It did not enhance. 2.   Couple scattered T2/FLAIR hyperintense foci consistent with age-appropriate very minimal chronic microvascular ischemic change or the sequela of migraine headache. 3.   No acute findings. 4.   Normal enhancement pattern.  ASSESSMENT AND PLAN  46 y.o. year old female with past medical history of cervicalgia and tension type headaches who is presenting for follow-up.  She reported her headache is still not controlled mainly her cervicalgia type headache.  She has tried sumatriptan but it was not helpful and actually made her feel worse.  She is not currently taking any preventive medication.  In the past she did well on amitriptyline but currently she is on duloxetine.  Instead of starting her on amitriptyline due to risk of serotonin syndrome, I will try her on Topamax low-dose.  She is already on gabapentin 300 mg nightly I have advised patient to continue it with the Topamax and if needed she can increase to 300 twice daily.  I will also try her on rizatriptan since she did not do well on sumatriptan.   For her right hand numbness, tingling, dropping things, I suspect that she has carpal tunnel syndrome, I will send her for a nerve conduction study.  Informed her that the gabapentin also helps  with the carpal tunnel syndrome.  I will contact her to go we will go over the result otherwise I will see her in office in 6 months.  Advised patient to contact me if her symptoms get worse or are not improve or if she if she has any side effect from the medication.  She voices understanding.   1. Migraine without aura and without status migrainosus, not intractable   2. Cervicalgia   3. Carpal tunnel syndrome of right wrist   4. Anxiety      Patient Instructions  EMG/NCS of the BUE to rule out carpal tunnel syndrome Will contact you to go over the results Start Topamax 25 mg 1/2 tablet nightly for one week then increase to full tablet  Start with Maxalt as needed for headaches  Discontinue Sumatriptan Please contact us if headaches are not improved.  Follow up in 6 months or sooner if worse     Orders Placed This Encounter  Procedures   NCV with EMG(electromyography)     Meds ordered this encounter  Medications   topiramate (TOPAMAX) 25 MG tablet    Sig: Take 1 tablet (25 mg total) by mouth at bedtime.    Dispense:  30 tablet    Refill:  6   rizatriptan (MAXALT-MLT) 10 MG disintegrating tablet    Sig: Take 1 tablet (10 mg total) by mouth as needed for migraine. May repeat in 2 hours if needed    Dispense:  10 tablet    Refill:  3    Return in about 6 months (around 11/11/2022).   Alric Ran, MD 05/13/2022, 2:57 PM  [facility] [address], [place], [place] [address] [phone]

## 2022-05-13 NOTE — Patient Instructions (Addendum)
EMG/NCS of the BUE to rule out carpal tunnel syndrome Will contact you to go over the results Start Topamax 25 mg 1/2 tablet nightly for one week then increase to full tablet  Start with Maxalt as needed for headaches  Discontinue Sumatriptan Please contact us if headaches are not improved.  Follow up in 6 months or sooner if worse

## 2022-05-13 NOTE — Telephone Encounter (Signed)
NCV/EMG order faxed to Dr. Domingo Cocking @ the [facility], phone # [phone].

## 2022-05-27 ENCOUNTER — Encounter: Payer: Self-pay | Admitting: Internal Medicine

## 2022-05-27 ENCOUNTER — Ambulatory Visit: Payer: Medicaid Other | Attending: Internal Medicine | Admitting: Internal Medicine

## 2022-05-27 VITALS — BP 172/101 | HR 59 | Resp 17 | Ht 64.0 in | Wt 161.2 lb

## 2022-05-27 DIAGNOSIS — M7918 Myalgia, other site: Secondary | ICD-10-CM | POA: Insufficient documentation

## 2022-05-27 DIAGNOSIS — M1990 Unspecified osteoarthritis, unspecified site: Secondary | ICD-10-CM | POA: Diagnosis not present

## 2022-05-27 NOTE — Progress Notes (Signed)
Office Visit Note  Patient: Kristin Montes             Date of Birth: 05-22-1976           MRN: [id number]             PCP: Georganna Skeans, MD Referring: Delfin Gant, MD Visit Date: 05/27/2022   Subjective:   History of Present Illness: Kerrington Sova is a 46 y.o. female here for evaluation of joint and body pains.  She has longstanding problem with degenerative arthritis of multiple areas.  She has chronic degenerative disc disease at multiple levels she follows up with Dr. Horald Chestnut for management and has had multiple back injections for pain management.  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  No specific underlying cause for this that she knows or documented.  Did previously use alcohol more than currently but not severely enough to impact her health and any other reported manner.  She sees Dr. Penni Bombard for osteoarthritis and foot and ankle pain management.  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  She often feels pain throughout her back and will get sensation of a lump or not is present with tenderness and burning around that area.  Overall she has noticed a general worsening of pain in multiple areas during the past year.  She has been working with physical therapy with a partial improvement.  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  Overall is had concern for accelerated or very early onset of degenerative change in multiple joint areas.  Previous laboratory testing has been unremarkable including inflammatory arthritis work-up in July with negative ANA negative rheumatoid factor normal inflammatory markers.    Activities of Daily Living:  Patient reports morning stiffness for 10-15 minutes.   Patient Reports nocturnal pain.  Difficulty dressing/grooming: Reports Difficulty  climbing stairs: Reports Difficulty getting out of chair: Reports Difficulty using hands for taps, buttons, cutlery, and/or writing: Reports  Review of Systems  Constitutional:  Positive for fatigue.  HENT:  Positive for mouth dryness. Negative for mouth sores.   Eyes:  Positive for dryness.  Respiratory:  Positive for shortness of breath.   Cardiovascular:  Negative for chest pain and palpitations.  Gastrointestinal:  Negative for blood in stool, constipation and diarrhea.  Endocrine: Positive for increased urination.  Genitourinary:  Negative for involuntary urination.  Musculoskeletal:  Positive for joint pain, gait problem, joint pain, joint swelling, myalgias, muscle weakness, morning stiffness, muscle tenderness and myalgias.  Skin:  Negative for color change, rash, hair loss and sensitivity to sunlight.  Allergic/Immunologic: Negative for susceptible to infections.  Neurological:  Positive for dizziness and headaches.  Hematological:  Negative for swollen glands.  Psychiatric/Behavioral:  Positive for depressed mood. Negative for sleep disturbance. The patient is nervous/anxious.     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 No pertinent family history. Past Surgical History:  Procedure Laterality Date   HERNIA REPAIR     NEPHRECTOMY     TOTAL HIP ARTHROPLASTY Left 08/18/2021   Procedure: TOTAL HIP ARTHROPLASTY ANTERIOR APPROACH;  Surgeon: Durene Romans, MD;  Location: [facility];  Service: Orthopedics;  Laterality: Left;   TUBAL LIGATION     Social History   Social History Narrative   Not on file   Immunization History  Administered Date(s) Administered   PPD Test 04/29/2017     Objective: Vital Signs: BP (!) 172/101 (BP Location: Right Arm, Patient Position: Sitting, Cuff Size: Normal) Comment: patient has been drinking coffee while at visit  Pulse (!) 59   Resp 17   Ht 5\' 4"  (1.626 m)   Wt 161 lb 3.2 oz (73.1 kg)   BMI 27.67 kg/m    Physical Exam HENT:     Mouth/Throat:     Mouth: Mucous membranes are moist.     Pharynx: Oropharynx is clear.  Eyes:     Conjunctiva/sclera: Conjunctivae normal.  Cardiovascular:     Rate and Rhythm: Normal rate and regular rhythm.  Pulmonary:     Effort: Pulmonary effort is normal.     Breath sounds: Normal breath sounds.  Musculoskeletal:     Right lower leg: No edema.     Left lower leg: No edema.  Lymphadenopathy:     Cervical: No cervical adenopathy.  Skin:    General: Skin is warm and dry.  Neurological:     Mental Status: She is alert.  Psychiatric:        Mood and Affect: Mood normal.      Musculoskeletal Exam:  Shoulders full ROM no tenderness or swelling Elbows full ROM no tenderness or swelling Wrists full ROM no tenderness or swelling Fingers full ROM no tenderness or swelling Tenderness to palpation over paraspinal muscles and extending laterally to both shoulders also more severe at the low back, some radiation of pain up and down within the back Knees full ROM no tenderness or swelling   Investigation: No additional findings.  Imaging: No results found.  Recent Labs: Lab Results  Component  Value Date   WBC 9.3 08/19/2021   HGB 10.9 (L) 08/19/2021   PLT 199 08/19/2021   NA 138 08/19/2021   K 4.0 08/19/2021   CL 103 08/19/2021   CO2 26 08/19/2021   GLUCOSE 130 (H) 08/19/2021   BUN 13 08/19/2021   CREATININE 0.71 08/19/2021   BILITOT 0.3 08/14/2021   ALKPHOS 106 08/14/2021   AST 25 08/14/2021   ALT 33 08/14/2021   PROT 7.0 08/14/2021   ALBUMIN 3.8 08/14/2021   CALCIUM 9.4 02/04/2022   GFRAA >90 12/16/2013    Speciality Comments: No specialty comments available.  Procedures:  No procedures performed Allergies: Patient has no known allergies.   Assessment / Plan:     Visit Diagnoses: Myofascial pain syndrome  Does have findings consistent with myofascial pain with multiple tender points present in the back with symptom radiation and pain on light to moderate pressure.  Does not have allodynia  on exam or much tenderness to palpation on her extremities.  Does have increased trouble with fatigue, headaches, concentration and brain fog, constipation, and dizziness without known underlying structural causes.  Current treatment including Flexeril and Cymbalta and Neurontin.  Recommended to review [employer] fibro guide for chronic myofascial pain management recommendation.  She is already established with multiple providers for her orthopedic issues and physical therapy so not recommending additional pain medicine or rehab involvement at this time..  Arthritis  Appears to have advanced for age degenerative arthritis changes in multiple areas.  No strong fracture history or abnormal labs to indicate metabolic disorder.  No history of hypermobility and dislocations.  With recent negative inflammatory work-up and image findings and exam without evidence of inflammatory disease changes do not see anything to indicate an underlying disease process.  Does probably need to exercise some caution with long-term use of NSAIDs for symptom management due to having 1 kidney  though her function has been pretty good.  Orders: No orders of the defined types were placed in this encounter.  No orders of the defined types were placed in this encounter.    Follow-Up Instructions: No follow-ups on file.   Fuller Plan, MD  Note - This record has been created using [employer].  Chart creation errors have been sought, but may not always  have been located. Such creation errors do not reflect on  the standard of medical care.

## 2022-05-27 NOTE — Patient Instructions (Signed)
I recommend checking out the [employer] patient-centered guide for fibromyalgia and chronic pain management: fibroguide.med.[employer].edu   Myofascial Pain Syndrome and Fibromyalgia Myofascial pain syndrome and fibromyalgia are both pain disorders. You may feel this pain mainly in your muscles. Myofascial pain syndrome: Always has tender points in the muscles that will cause pain when pressed (trigger points). The pain may come and go. Usually affects your neck, upper back, and shoulder areas. The pain often moves into your arms and hands. Fibromyalgia: Has muscle pains and tenderness that come and go. Is often associated with tiredness (fatigue) and sleep problems. Has trigger points. Tends to be long-lasting (chronic), but is not life-threatening. Fibromyalgia and myofascial pain syndrome are not the same. However, they often occur together. If you have both conditions, each can make the other worse. Both are common and can cause enough pain and fatigue to make day-to-day activities difficult. Both can be hard to diagnose because their symptoms are common in many other conditions. What are the causes? The exact causes of these conditions are not known. What increases the risk? You are more likely to develop either of these conditions if: You have a family history of the condition. You are female. You have certain triggers, such as: Spine disorders. An injury (trauma) or other physical stressors. Being under a lot of stress. Medical conditions such as osteoarthritis, rheumatoid arthritis, or lupus. What are the signs or symptoms? Fibromyalgia The main symptom of fibromyalgia is widespread pain and tenderness in your muscles. Pain is sometimes described as stabbing, shooting, or burning. You may also have: Tingling or numbness. Sleep problems and fatigue. Problems with attention and concentration (fibro fog). Other symptoms may include: Bowel and bladder  problems. Headaches. Vision problems. Sensitivity to odors and noises. Depression or mood changes. Painful menstrual periods (dysmenorrhea). Dry skin or eyes. These symptoms can vary over time. Myofascial pain syndrome Symptoms of myofascial pain syndrome include: Tight, ropy bands of muscle. Uncomfortable sensations in muscle areas. These may include aching, cramping, burning, numbness, tingling, and weakness. Difficulty moving certain parts of the body freely (poor range of motion). How is this diagnosed? This condition may be diagnosed by your symptoms and medical history. You will also have a physical exam. In general: Fibromyalgia is diagnosed if you have pain, fatigue, and other symptoms for more than 3 months, and symptoms cannot be explained by another condition. Myofascial pain syndrome is diagnosed if you have trigger points in your muscles, and those trigger points are tender and cause pain elsewhere in your body (referred pain). How is this treated? Treatment for these conditions depends on the type that you have. For fibromyalgia a healthy lifestyle is the most important treatment including aerobic and strength exercises. Different types of medicines are used to help treat pain and include: NSAIDs. Medicines for treating depression. Medicines that help control seizures. Medicines that relax the muscles. Treatment for myofascial pain syndrome includes: Pain medicines, such as NSAIDs. Cooling and stretching of muscles. Massage therapy with myofascial release technique. Trigger point injections. Treating these conditions often requires a team of health care providers. These may include: Your primary care provider. A physical therapist. Complementary health care providers, such as massage therapists or acupuncturists. A psychiatrist for cognitive behavioral therapy. Follow these instructions at home: Medicines Take over-the-counter and prescription medicines only as told  by your health care provider. Ask your health care provider if the medicine prescribed to you: Requires you to avoid driving or using machinery. Can cause constipation. You   may need to take these actions to prevent or treat constipation: Drink enough fluid to keep your urine pale yellow. Take over-the-counter or prescription medicines. Eat foods that are high in fiber, such as beans, whole grains, and fresh fruits and vegetables. Limit foods that are high in fat and processed sugars, such as fried or sweet foods. Lifestyle  Do exercises as told by your health care provider or physical therapist. Practice relaxation techniques to control your stress. You may want to try: Biofeedback. Visual imagery. Hypnosis. Muscle relaxation. Yoga. Meditation. Maintain a healthy lifestyle. This includes eating a healthy diet and getting enough sleep. Do not use any products that contain nicotine or tobacco. These products include cigarettes, chewing tobacco, and vaping devices, such as e-cigarettes. If you need help quitting, ask your health care provider. General instructions Talk to your health care provider about complementary treatments, such as acupuncture or massage. Do not do activities that stress or strain your muscles. This includes repetitive motions and heavy lifting. Keep all follow-up visits. This is important. Where to find support Consider joining a support group with others who are diagnosed with this condition. National Fibromyalgia Association: www.fmaware.org Where to find more information American Chronic Pain Association: www.theacpa.org Contact a health care provider if: You have new symptoms. Your symptoms get worse or your pain is severe. You have side effects from your medicines. You have trouble sleeping. Your condition is causing depression or anxiety. Get help right away if: You have thoughts of hurting yourself or others. Get help right awayif you feel like you may  hurt yourself or others, or have thoughts about taking your own life. Go to your nearest emergency room or: Call 911. Call the National Suicide Prevention Lifeline at 1-800-273-8255 or 988. This is open 24 hours a day. Text the Crisis Text Line at 741741. Summary Myofascial pain syndrome and fibromyalgia are pain disorders. Myofascial pain syndrome has tender points in the muscles that will cause pain when pressed (trigger points). Fibromyalgia also has muscle pains and tenderness that come and go, but this condition is often associated with fatigue and sleep disturbances. Fibromyalgia and myofascial pain syndrome are not the same but often occur together, causing pain and fatigue that make day-to-day activities difficult. Follow your health care provider's instructions for taking medicines and maintaining a healthy lifestyle. This information is not intended to replace advice given to you by your health care provider. Make sure you discuss any questions you have with your health care provider. Document Revised: 05/29/2021 Document Reviewed: 05/29/2021 Elsevier Patient Education  2023 Elsevier Inc.  

## 2022-06-10 ENCOUNTER — Encounter: Payer: Self-pay | Admitting: Neurology

## 2022-06-10 NOTE — Progress Notes (Signed)
Nerve conduction study completed 06/09/2022 This is an abnormal study with electrophysiologic evidence of bilateral median mononeuropathies across the wrist consistent with moderate carpal tunnel syndrome without denervation.  There is no evidence of cervical radiculopathy, plexopathy, polyneuropathy or myopathy.

## 2022-06-22 ENCOUNTER — Telehealth: Payer: Self-pay | Admitting: Neurology

## 2022-06-22 ENCOUNTER — Encounter: Payer: Self-pay | Admitting: Neurology

## 2022-06-22 ENCOUNTER — Other Ambulatory Visit: Payer: Self-pay | Admitting: Neurology

## 2022-06-22 DIAGNOSIS — G5603 Carpal tunnel syndrome, bilateral upper limbs: Secondary | ICD-10-CM

## 2022-06-22 NOTE — Progress Notes (Signed)
Discussed with patient EMG results consistent with bilateral carpal tunnel syndrome. I have recommended he to use bilateral wrist braces and I will refer her to hand surgery for bilateral CT release surgery. Patient is comfortable with plans.    [name], MD

## 2022-06-22 NOTE — Telephone Encounter (Signed)
Referral for hand surgery fax to [facility]. Phone: [phone], Fax: [phone] 

## 2022-08-09 ENCOUNTER — Ambulatory Visit (INDEPENDENT_AMBULATORY_CARE_PROVIDER_SITE_OTHER): Payer: Commercial Managed Care - HMO | Admitting: Family Medicine

## 2022-08-09 VITALS — BP 134/88 | HR 68 | Temp 98.1°F | Resp 16 | Wt 157.4 lb

## 2022-08-09 DIAGNOSIS — M2550 Pain in unspecified joint: Secondary | ICD-10-CM

## 2022-08-09 DIAGNOSIS — E739 Lactose intolerance, unspecified: Secondary | ICD-10-CM | POA: Diagnosis not present

## 2022-08-09 DIAGNOSIS — Z905 Acquired absence of kidney: Secondary | ICD-10-CM

## 2022-08-09 DIAGNOSIS — F17200 Nicotine dependence, unspecified, uncomplicated: Secondary | ICD-10-CM

## 2022-08-09 MED ORDER — CYCLOBENZAPRINE HCL 10 MG PO TABS: ORAL_TABLET | ORAL | 1 refills | Status: DC

## 2022-08-09 NOTE — Progress Notes (Unsigned)
Patient is her for medication refill.  Patient c/o having GI issues and need int see gastro. Patient also said she has 1 kidney and wound like to see a urologists well.

## 2022-08-10 ENCOUNTER — Encounter: Payer: Self-pay | Admitting: Family Medicine

## 2022-08-10 NOTE — Progress Notes (Signed)
Established Patient Office Visit  Subjective    Patient ID: Kristin Montes, female    DOB: 09/07/1975  Age: 47 y.o. MRN: [id number]  CC:  Chief Complaint  Patient presents with   Follow-up    HPI Kristin Montes presents with complaint of wanting follow up of previous nephrectomy. She denies complaints. She also reports some gas, bloating and intermittent diarrhea when she eats. She eats a lot of dairy products and is extremely fond of cheeses. She denies fever/chills or viral sx.    Outpatient Encounter Medications as of 08/09/2022  Medication Sig   DULoxetine (CYMBALTA) 30 MG capsule Take 1 capsule by mouth twice daily   gabapentin (NEURONTIN) 300 MG capsule Take by mouth.   HYDROcodone-acetaminophen (NORCO) 7.5-325 MG tablet Take 1 tablet by mouth every 6 (six) hours.   rizatriptan (MAXALT-MLT) 10 MG disintegrating tablet Take 1 tablet (10 mg total) by mouth as needed for migraine. May repeat in 2 hours if needed   topiramate (TOPAMAX) 25 MG tablet Take 1 tablet (25 mg total) by mouth at bedtime.   [DISCONTINUED] cyclobenzaprine (FLEXERIL) 10 MG tablet cyclobenzaprine 10 mg tablet  Take 1 tablet 3 times a day by oral route.   cyclobenzaprine (FLEXERIL) 10 MG tablet cyclobenzaprine 10 mg tablet  Take 1 tablet 3 times a day by oral route.   [DISCONTINUED] EYSUVIS 0.25 % SUSP Apply to eye. (Patient not taking: Reported on 05/27/2022)   [DISCONTINUED] RESTASIS 0.05 % ophthalmic emulsion 1 drop 2 (two) times daily. (Patient not taking: Reported on 05/27/2022)   No facility-administered encounter medications on file as of 08/09/2022.    Past Medical History:  Diagnosis Date   Arthritis    Complication of anesthesia    slow to wake up with nephrectomy   Depression    Headache    History of kidney stones    Pneumonia     Past Surgical History:  Procedure Laterality Date   HERNIA REPAIR     NEPHRECTOMY     TOTAL HIP ARTHROPLASTY Left 08/18/2021   Procedure: TOTAL HIP ARTHROPLASTY  ANTERIOR APPROACH;  Surgeon: Paralee Cancel, MD;  Location: [facility];  Service: Orthopedics;  Laterality: Left;   TUBAL LIGATION      History reviewed. No pertinent family history.  Social History   Socioeconomic History   Marital status: Single    Spouse name: Not on file   Number of children: Not on file   Years of education: Not on file   Highest education level: Not on file  Occupational History   Not on file  Tobacco Use   Smoking status: Every Day    Packs/day: 0.25    Types: Cigarettes    Passive exposure: Never   Smokeless tobacco: Never  Vaping Use   Vaping Use: Never used  Substance and Sexual Activity   Alcohol use: Not Currently   Drug use: Yes    Types: Marijuana    Comment: daily   Sexual activity: Not on file  Other Topics Concern   Not on file  Social History Narrative   Not on file   Social Determinants of Health   Financial Resource Strain: Not on file  Food Insecurity: Not on file  Transportation Needs: Not on file  Physical Activity: Not on file  Stress: Not on file  Social Connections: Not on file  Intimate Partner Violence: Not on file    Review of Systems  Gastrointestinal:  Positive for diarrhea. Negative for heartburn.  All other systems  reviewed and are negative.       Objective    BP 134/88   Pulse 68   Temp 98.1 F (36.7 C) (Oral)   Resp 16   Wt 157 lb 6.4 oz (71.4 kg)   SpO2 99%   BMI 27.02 kg/m   Physical Exam Vitals and nursing note reviewed.  Constitutional:      General: She is not in acute distress. Cardiovascular:     Rate and Rhythm: Normal rate and regular rhythm.  Pulmonary:     Effort: Pulmonary effort is normal.     Breath sounds: Normal breath sounds.  Abdominal:     Palpations: Abdomen is soft.     Tenderness: There is no abdominal tenderness.  Neurological:     General: No focal deficit present.     Mental Status: She is alert and oriented to person, place, and time.         Assessment &  Plan:   1. History of nephrectomy, left Referral to nephrology for further eval/mgt - Ambulatory referral to Nephrology  2. Arthralgia, unspecified joint Flexeril refilled  3. Dietary lactose intolerance Discussed dietary options. Will keep dietary log  4. Smoking Discussed reduction/cessation    Return in about 3 months (around 11/08/2022) for follow up.   Becky Sax, MD

## 2022-08-18 ENCOUNTER — Ambulatory Visit: Payer: Medicaid Other | Admitting: Family Medicine

## 2022-08-25 HISTORY — PX: CARPAL TUNNEL RELEASE: SHX101

## 2022-10-02 DIAGNOSIS — M1611 Unilateral primary osteoarthritis, right hip: Secondary | ICD-10-CM | POA: Insufficient documentation

## 2022-10-15 LAB — LAB REPORT - SCANNED
Albumin, Urine POC: 13.1
Albumin/Creatinine Ratio, Urine, POC: 6
Creatinine, POC: 212.8 mg/dL
EGFR: 77

## 2022-11-04 ENCOUNTER — Telehealth (INDEPENDENT_AMBULATORY_CARE_PROVIDER_SITE_OTHER): Payer: Commercial Managed Care - HMO | Admitting: Family Medicine

## 2022-11-04 DIAGNOSIS — J309 Allergic rhinitis, unspecified: Secondary | ICD-10-CM

## 2022-11-04 DIAGNOSIS — J3489 Other specified disorders of nose and nasal sinuses: Secondary | ICD-10-CM

## 2022-11-04 MED ORDER — CETIRIZINE HCL 10 MG PO TABS: 10.0000 mg | ORAL_TABLET | Freq: Every day | ORAL | 3 refills | Status: AC

## 2022-11-04 MED ORDER — FLUTICASONE PROPIONATE 50 MCG/ACT NA SUSP: 2.0000 | Freq: Every day | NASAL | 2 refills | Status: DC

## 2022-11-09 ENCOUNTER — Encounter: Payer: Self-pay | Admitting: Family Medicine

## 2022-11-09 NOTE — Progress Notes (Signed)
Virtual Visit via Video Note  I connected with Kristin Montes on 11/09/22 at  1:20 PM EDT by a video enabled telemedicine application and verified that I am speaking with the correct person using two identifiers.  Location: Patient: [place] Provider: [place]   I discussed the limitations of evaluation and management by telemedicine and the availability of in person appointments. The patient expressed understanding and agreed to proceed.  History of Present Illness: Patient reports sinus congestion and pain. Patient denies fever/chills or viral sx.    Observations/Objective:   Assessment and Plan: 1. Allergic rhinitis, unspecified seasonality, unspecified trigger Zyrtec and flonase prescribed.   2. Sinus pain As above   Follow Up Instructions:    I discussed the assessment and treatment plan with the patient. The patient was provided an opportunity to ask questions and all were answered. The patient agreed with the plan and demonstrated an understanding of the instructions.   The patient was advised to call back or seek an in-person evaluation if the symptoms worsen or if the condition fails to improve as anticipated.  I provided 14 minutes of non-face-to-face time during this encounter.   Tommie Raymond, MD

## 2022-11-10 LAB — RENAL FUNCTION PANEL: EGFR: 87

## 2022-12-13 DIAGNOSIS — M5416 Radiculopathy, lumbar region: Secondary | ICD-10-CM | POA: Insufficient documentation

## 2023-01-24 DIAGNOSIS — G894 Chronic pain syndrome: Secondary | ICD-10-CM | POA: Insufficient documentation

## 2023-02-24 DIAGNOSIS — G5602 Carpal tunnel syndrome, left upper limb: Secondary | ICD-10-CM | POA: Insufficient documentation

## 2023-03-07 DIAGNOSIS — M7062 Trochanteric bursitis, left hip: Secondary | ICD-10-CM | POA: Insufficient documentation

## 2023-03-13 DIAGNOSIS — M7061 Trochanteric bursitis, right hip: Secondary | ICD-10-CM | POA: Insufficient documentation

## 2023-03-19 DIAGNOSIS — I639 Cerebral infarction, unspecified: Secondary | ICD-10-CM | POA: Insufficient documentation

## 2023-03-25 ENCOUNTER — Encounter (HOSPITAL_COMMUNITY): Payer: Self-pay | Admitting: *Deleted

## 2023-03-25 ENCOUNTER — Telehealth: Payer: Self-pay | Admitting: Neurology

## 2023-03-25 ENCOUNTER — Emergency Department (HOSPITAL_COMMUNITY): Payer: Medicare Other

## 2023-03-25 ENCOUNTER — Other Ambulatory Visit: Payer: Self-pay

## 2023-03-25 ENCOUNTER — Emergency Department (HOSPITAL_COMMUNITY)
Admission: EM | Admit: 2023-03-25 | Discharge: 2023-03-25 | Discharge status: Against Medical Advice | Payer: Medicare Other | Attending: Emergency Medicine | Admitting: Emergency Medicine

## 2023-03-25 DIAGNOSIS — Z87442 Personal history of urinary calculi: Secondary | ICD-10-CM | POA: Insufficient documentation

## 2023-03-25 DIAGNOSIS — R4701 Aphasia: Secondary | ICD-10-CM | POA: Diagnosis not present

## 2023-03-25 DIAGNOSIS — Z91040 Latex allergy status: Secondary | ICD-10-CM | POA: Diagnosis not present

## 2023-03-25 DIAGNOSIS — G9389 Other specified disorders of brain: Secondary | ICD-10-CM | POA: Diagnosis not present

## 2023-03-25 DIAGNOSIS — R479 Unspecified speech disturbances: Secondary | ICD-10-CM | POA: Insufficient documentation

## 2023-03-25 DIAGNOSIS — R519 Headache, unspecified: Secondary | ICD-10-CM | POA: Diagnosis not present

## 2023-03-25 DIAGNOSIS — E049 Nontoxic goiter, unspecified: Secondary | ICD-10-CM | POA: Insufficient documentation

## 2023-03-25 DIAGNOSIS — I6523 Occlusion and stenosis of bilateral carotid arteries: Secondary | ICD-10-CM | POA: Insufficient documentation

## 2023-03-25 DIAGNOSIS — R42 Dizziness and giddiness: Secondary | ICD-10-CM | POA: Diagnosis not present

## 2023-03-25 DIAGNOSIS — R4781 Slurred speech: Secondary | ICD-10-CM | POA: Diagnosis present

## 2023-03-25 LAB — CBC WITH DIFFERENTIAL/PLATELET
Abs Immature Granulocytes: 0.02 10*3/uL (ref 0.00–0.07)
Basophils Absolute: 0 10*3/uL (ref 0.0–0.1)
Basophils Relative: 1 %
Eosinophils Absolute: 0.1 10*3/uL (ref 0.0–0.5)
Eosinophils Relative: 1 %
HCT: 38.6 % (ref 36.0–46.0)
Hemoglobin: 12.7 g/dL (ref 12.0–15.0)
Immature Granulocytes: 0 %
Lymphocytes Relative: 26 %
Lymphs Abs: 1.7 10*3/uL (ref 0.7–4.0)
MCH: 31.6 pg (ref 26.0–34.0)
MCHC: 32.9 g/dL (ref 30.0–36.0)
MCV: 96 fL (ref 80.0–100.0)
Monocytes Absolute: 0.6 10*3/uL (ref 0.1–1.0)
Monocytes Relative: 10 %
Neutro Abs: 4.1 10*3/uL (ref 1.7–7.7)
Neutrophils Relative %: 62 %
Platelets: 205 10*3/uL (ref 150–400)
RBC: 4.02 MIL/uL (ref 3.87–5.11)
RDW: 13.7 % (ref 11.5–15.5)
WBC: 6.5 10*3/uL (ref 4.0–10.5)
nRBC: 0.3 % — ABNORMAL HIGH (ref 0.0–0.2)

## 2023-03-25 LAB — RAPID URINE DRUG SCREEN, HOSP PERFORMED
Amphetamines: NOT DETECTED
Barbiturates: NOT DETECTED
Benzodiazepines: NOT DETECTED
Cocaine: NOT DETECTED
Opiates: NOT DETECTED
Tetrahydrocannabinol: POSITIVE — AB

## 2023-03-25 LAB — URINALYSIS, ROUTINE W REFLEX MICROSCOPIC
Bilirubin Urine: NEGATIVE
Glucose, UA: NEGATIVE mg/dL
Hgb urine dipstick: NEGATIVE
Ketones, ur: NEGATIVE mg/dL
Nitrite: NEGATIVE
Protein, ur: NEGATIVE mg/dL
Specific Gravity, Urine: 1.021 (ref 1.005–1.030)
pH: 5 (ref 5.0–8.0)

## 2023-03-25 LAB — BASIC METABOLIC PANEL
Anion gap: 9 (ref 5–15)
BUN: 10 mg/dL (ref 6–20)
CO2: 25 mmol/L (ref 22–32)
Calcium: 8.7 mg/dL — ABNORMAL LOW (ref 8.9–10.3)
Chloride: 103 mmol/L (ref 98–111)
Creatinine, Ser: 0.78 mg/dL (ref 0.44–1.00)
GFR, Estimated: >60 mL/min (ref >60)
Glucose, Bld: 90 mg/dL (ref 70–99)
Potassium: 3.8 mmol/L (ref 3.5–5.1)
Sodium: 137 mmol/L (ref 135–145)

## 2023-03-25 LAB — PROTIME-INR
INR: 1 (ref 0.8–1.2)
Prothrombin Time: 12.9 s (ref 11.4–15.2)

## 2023-03-25 LAB — APTT: aPTT: 31 s (ref 24–36)

## 2023-03-25 LAB — ETHANOL: Alcohol, Ethyl (B): <10 mg/dL (ref <10)

## 2023-03-25 MED ORDER — DIPHENHYDRAMINE HCL 50 MG/ML IJ SOLN
12.5000 mg | Freq: Once | INTRAMUSCULAR | Status: AC
  Administered 2023-03-25: 12.5 mg via INTRAVENOUS
  Filled 2023-03-25: qty 1

## 2023-03-25 MED ORDER — LORAZEPAM 2 MG/ML IJ SOLN: 1.0000 mg | Freq: Once | INTRAMUSCULAR | Status: DC | PRN

## 2023-03-25 MED ORDER — IOHEXOL 350 MG/ML SOLN
75.0000 mL | Freq: Once | INTRAVENOUS | Status: AC | PRN
  Administered 2023-03-25: 75 mL via INTRAVENOUS

## 2023-03-25 MED ORDER — PROCHLORPERAZINE EDISYLATE 10 MG/2ML IJ SOLN
10.0000 mg | Freq: Once | INTRAMUSCULAR | Status: AC
  Administered 2023-03-25: 10 mg via INTRAVENOUS
  Filled 2023-03-25: qty 2

## 2023-03-25 NOTE — Telephone Encounter (Signed)
Pt came into the office and stated that her primary care advised her to come to the neurologist for treatment for what is believed to be an allergic reaction from Dicloflenac. Pt is experiencing swelling in the throat, difficulty with speech, and headaches. I informed pt that we would be able to see her for headaches but she would need to speak with her primary care regarding the other symptoms. Pt scheduled with Dr. Teresa Coombs for 09/28/23 and appt added to wait list. Please give pt a call to discuss

## 2023-03-25 NOTE — ED Provider Notes (Signed)
[place] EMERGENCY DEPARTMENT AT [facility] Provider Note   CSN: [id number] Arrival date & time: 03/25/23  1141     History  Chief Complaint  Patient presents with   speech problems since Sat   Dizziness    Kristin Montes is a 47 y.o. female with a past medical history significant for history of migraines, depression, history of kidney stones who presents to the ED due to speech changes that started 6 days ago.  Patient states she developed slurred speech on Saturday.  She also admits to word finding difficulties. Speech changes started after taking a dose of Diclofenac. She believes she has slight swelling under her tongue. Swelling has resolved and speech changes have continued. No rash. Denies shortness of breath. She has taken medication in the past with no reaction. Patient then developed a posterior headache and dizziness 4 days ago. Describes dizziness as feeling off balance.  Admits to some visual changes in the left eye.  Denies unilateral weakness.  No recent head injury.  No fever or chills.  No neck stiffness.  History obtained from patient and past medical records. No interpreter used during encounter.       Home Medications Prior to Admission medications   Medication Sig Start Date End Date Taking? Authorizing Provider  cetirizine (ZYRTEC) 10 MG tablet Take 1 tablet (10 mg total) by mouth daily. 11/04/22   Georganna Skeans, MD  cyclobenzaprine (FLEXERIL) 10 MG tablet cyclobenzaprine 10 mg tablet  Take 1 tablet 3 times a day by oral route. 08/09/22   Georganna Skeans, MD  DULoxetine (CYMBALTA) 30 MG capsule Take 1 capsule by mouth twice daily 01/28/22   Georganna Skeans, MD  fluticasone [facility]) 50 MCG/ACT nasal spray Place 2 sprays into both nostrils daily. 11/04/22   Georganna Skeans, MD  gabapentin (NEURONTIN) 300 MG capsule Take by mouth. 04/15/22   [provider]  HYDROcodone-acetaminophen (NORCO) 7.5-325 MG tablet Take 1 tablet by mouth every 6 (six)  hours. 04/23/22   [provider]  rizatriptan (MAXALT-MLT) 10 MG disintegrating tablet Take 1 tablet (10 mg total) by mouth as needed for migraine. May repeat in 2 hours if needed 05/13/22   [name], MD  topiramate (TOPAMAX) 25 MG tablet Take 1 tablet (25 mg total) by mouth at bedtime. 05/13/22 12/09/22  [name], MD      Allergies    Latex    Review of Systems   Review of Systems  Constitutional:  Negative for fever.  Eyes:  Positive for visual disturbance.  Musculoskeletal:  Negative for neck stiffness.  Neurological:  Positive for dizziness, speech difficulty and headaches.  All other systems reviewed and are negative.   Physical Exam Updated Vital Signs BP (!) 151/95   Pulse (!) 58   Temp 98.2 F (36.8 C)   Resp 16   Ht 5\' 4"  (1.626 m)   Wt 70.3 kg   SpO2 100%   BMI 26.61 kg/m  Physical Exam Vitals and nursing note reviewed.  Constitutional:      General: She is not in acute distress.    Appearance: She is not ill-appearing.  HENT:     Head: Normocephalic.  Eyes:     Pupils: Pupils are equal, round, and reactive to light.  Cardiovascular:     Rate and Rhythm: Normal rate and regular rhythm.     Pulses: Normal pulses.     Heart sounds: Normal heart sounds. No murmur heard.    No friction rub. No gallop.  Pulmonary:     Effort: Pulmonary effort is normal.     Breath sounds: Normal breath sounds.  Abdominal:     General: Abdomen is flat. There is no distension.     Palpations: Abdomen is soft.     Tenderness: There is no abdominal tenderness. There is no guarding or rebound.  Musculoskeletal:        General: Normal range of motion.     Cervical back: Neck supple.  Skin:    General: Skin is warm and dry.  Neurological:     General: No focal deficit present.     Mental Status: She is alert.     Comments: Patient does have some word finding difficulties Able to follow commands CN III-XII intact Normal strength in upper and lower  extremities bilaterally including dorsiflexion and plantar flexion, strong and equal grip strength Sensation grossly intact throughout Moves extremities without ataxia, coordination intact No pronator drift Ambulates without difficulty    Psychiatric:        Mood and Affect: Mood normal.        Behavior: Behavior normal.     ED Results / Procedures / Treatments   Labs (all labs ordered are listed, but only abnormal results are displayed) Labs Reviewed  CBC WITH DIFFERENTIAL/PLATELET - Abnormal; Notable for the following components:      Result Value   nRBC 0.3 (*)    All other components within normal limits  BASIC METABOLIC PANEL - Abnormal; Notable for the following components:   Calcium 8.7 (*)    All other components within normal limits  URINALYSIS, ROUTINE W REFLEX MICROSCOPIC - Abnormal; Notable for the following components:   APPearance HAZY (*)    Leukocytes,Ua TRACE (*)    Bacteria, UA RARE (*)    All other components within normal limits  RAPID URINE DRUG SCREEN, HOSP PERFORMED - Abnormal; Notable for the following components:   Tetrahydrocannabinol POSITIVE (*)    All other components within normal limits  PROTIME-INR  APTT  ETHANOL    EKG None  Radiology CT ANGIO HEAD NECK W WO CM  Result Date: 03/25/2023 CLINICAL DATA:  Subarachnoid hemorrhage. EXAM: CT ANGIOGRAPHY HEAD AND NECK WITH AND WITHOUT CONTRAST TECHNIQUE: Multidetector CT imaging of the head and neck was performed using the standard protocol during bolus administration of intravenous contrast. Multiplanar CT image reconstructions and MIPs were obtained to evaluate the vascular anatomy. Carotid stenosis measurements (when applicable) are obtained utilizing NASCET criteria, using the distal internal carotid diameter as the denominator. RADIATION DOSE REDUCTION: This exam was performed according to the departmental dose-optimization program which includes automated exposure control, adjustment of the mA  and/or kV according to patient size and/or use of iterative reconstruction technique. CONTRAST:  75mL OMNIPAQUE IOHEXOL 350 MG/ML SOLN COMPARISON:  MRI of the brain April 25, 2021. FINDINGS: CT HEAD FINDINGS Brain: No evidence of acute infarction, hemorrhage, hydrocephalus, extra-axial collection or mass lesion/mass effect. Area of encephalomalacia and in the right parieto-occipital region. Small areas of encephalomalacia and gliosis are also seen anterior left basal ganglia region and left cerebellar hemisphere. Vascular: No hyperdense vessel or unexpected calcification. Skull: Normal. Negative for fracture or focal lesion. Sinuses/Orbits: No acute finding. Other: None. Review of the MIP images confirms the above findings CTA NECK FINDINGS Aortic arch: Imaged portion appear unremarkable. Common origin of the innominate and left common carotid artery from the aortic arch. No significant stenosis of the major arch vessel origins. Right carotid system: Hairline stenosis of the cervical  right internal carotid artery from the distal carotid bulb related to intracranial occlusion. Left carotid system: Mild atherosclerotic changes of the left carotid bifurcation without stenosis. There is increased tortuosity of the cervical segment of the left ICA. Vertebral arteries: Codominant. No evidence of dissection, stenosis (50% or greater), or occlusion. Skeleton: Probable remote C5 clay-shoveler's fracture. Degenerative changes of the cervical spine and sternoclavicular joints. Other neck: Enlarged and mildly heterogeneous thyroid gland. Upper chest: Paraseptal emphysema. Review of the MIP images confirms the above findings CTA HEAD FINDINGS Anterior circulation: String opacification of the right ICA in the vertical petrous segment with occlusion seen from the horizontal petrous to the supraclinoid segment. Reconstitution of flow is seen via prominent right posterior communicating artery. There is asymmetry of the anterior  circulation with small caliber of the right MCA and right ACA vascular tree compared to the contralateral. Normal caliber of the intracranial left internal carotid artery. No aneurysm or vascular malformation identified. Posterior circulation: No significant stenosis, proximal occlusion, aneurysm, or vascular malformation. Venous sinuses: As permitted by contrast timing, patent. Anatomic variants: None significant. Review of the MIP images confirms the above findings IMPRESSION: 1. No acute intracranial abnormality. 2. 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 Small areas of encephalomalacia and gliosis are also seen anterior left basal ganglia region and left cerebellar hemisphere. 3. String opacification of the right ICA in the cervical and vertical petrous segment with occlusion seen from the horizontal petrous to the supraclinoid segment. Reconstitution of flow is seen via prominent right posterior communicating artery. This is likely a chronic finding given abnormal flow void seen on prior MRI of the brain performed October 2022. 4. 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 5. Mild atherosclerotic changes of the left carotid bifurcation without stenosis. 6. Incidental heterogeneous and enlarged thyroid. Recommend non-emergent thyroid ultrasound. Reference: J Am Coll Radiol. 2015 Feb;12(2): 143-50. 7. Emphysema. Emphysema (ICD10-J43.9). Electronically Signed   By: Baldemar Lenis M.D.   On: 03/25/2023 17:00    Procedures Procedures    Medications Ordered in ED Medications  LORazepam (ATIVAN) injection 1 mg (has no administration in time range)  diphenhydrAMINE (BENADRYL) injection 12.5 mg (12.5 mg Intravenous Given 03/25/23 1528)  prochlorperazine (COMPAZINE) injection 10 mg (10 mg Intravenous Given 03/25/23 1531)  iohexol  (OMNIPAQUE) 350 MG/ML injection 75 mL (75 mLs Intravenous Contrast Given 03/25/23 1603)    ED Course/ Medical Decision Making/ A&P Clinical Course as of 03/25/23 1840  Fri Mar 25, 2023  1606 Tetrahydrocannabinol(!): POSITIVE [CA]    Clinical Course User Index [CA] Mannie Stabile, PA-C                                 Medical Decision Making Amount and/or Complexity of Data Reviewed Labs: ordered. Decision-making details documented in ED Course. Radiology: ordered and independent interpretation performed. Decision-making details documented in ED Course. ECG/medicine tests: ordered and independent interpretation performed. Decision-making details documented in ED Course.  Risk Prescription drug management.   This patient presents to the ED for concern of speech changes, this involves an extensive number of treatment options, and is a complaint that carries with it a high risk of complications and morbidity.  The differential diagnosis includes CVA, metabolic derangement, infection, etc  47 year old female presents to the ED due to speech  changes x 6 days.  Also admits to a posterior headache and dizziness for the past 5 days.  History of migraines.  No fever or chills.  No neck stiffness.  Upon arrival patient afebrile, not tachycardic or hypoxic.  Patient in no acute distress.  Patient does have occasional word finding difficulties on exam.  No pronator drift.  Able to follow commands.  Stroke labs ordered.  CT head and CTA head/neck ordered.  Patient will likely need MRI to rule out CVA.  Less likely allergic reaction given no edema on exam.  No signs of respiratory distress. Lungs clear to auscultation bilaterally.  No stridor or wheeze.  No rash.  CBC reassuring.  No leukocytosis.  Normal hemoglobin.  CMP reassuring.  Normal renal function.  No major electrolyte derangements.  He has positive for THC.  UA significant for trace leukocytes and rare bacteria.  Patient has no urinary  symptoms.  Will hold off on any antibiotics at this time. CTA head/neck personally reviewed and interpreted which demonstrates: 1. No acute intracranial abnormality.  2. 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  Small areas of encephalomalacia and gliosis are also seen anterior  left basal ganglia region and left cerebellar hemisphere.  3. String opacification of the right ICA in the cervical and  vertical petrous segment with occlusion seen from the horizontal  petrous to the supraclinoid segment. Reconstitution of flow is seen  via prominent right posterior communicating artery. This is likely a  chronic finding given abnormal flow void seen on prior MRI of the  brain performed October 2022.  4. 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  5. Mild atherosclerotic changes of the left carotid bifurcation  without stenosis.  6. Incidental heterogeneous and enlarged thyroid. Recommend  non-emergent thyroid ultrasound.  Reference: J Am Coll Radiol. 2015 Feb;12(2): 143-50.  7. Emphysema.    6:09 PM informed by RN patient wants to [facility].  Reported to bedside to discuss CTA results with patient.  Patient made aware that she needs MRI to rule out CVA.  Patient made aware CVA can be life-threatening.  She still would like to leave without MRI.  Unable to speak to neurology prior to patient leaving AGAINST MEDICAL ADVICE.  Advised patient to follow-up with her neurologist. Patient in right state of mind to make medical conditions. Patient stable upon leaving department.   Lives at home Hx migraines Has PCP       Final Clinical Impression(s) / ED Diagnoses Final diagnoses:  Acute nonintractable headache, unspecified headache type  Speech disturbance, unspecified type    Rx / DC Orders ED Discharge Orders     None          Mannie Stabile, PA-C 03/25/23 1842    Royanne Foots, DO 03/31/23 1626

## 2023-03-25 NOTE — Telephone Encounter (Signed)
Called and spoke to pt, advised to go to ER asap for throat swelling and she voiced understanding

## 2023-03-25 NOTE — ED Triage Notes (Addendum)
Pt had difficulty with speech on Sat which has improved, dizziness on Monday to the point of almost falling. Pt notes swelling under tongue during the time she was having speech difficulty. Pt states she is "not talking as good as she was" Pt with clear speech during triage. Now has pain in back of head that started Sat then stopped and restarted yesterday. PCP told her to come to the ED when she called office.

## 2023-03-29 NOTE — Telephone Encounter (Signed)
Angie- made her staff aware.

## 2023-04-04 ENCOUNTER — Ambulatory Visit (INDEPENDENT_AMBULATORY_CARE_PROVIDER_SITE_OTHER): Payer: Medicare Other | Admitting: Family Medicine

## 2023-04-04 ENCOUNTER — Encounter: Payer: Self-pay | Admitting: Family Medicine

## 2023-04-04 VITALS — BP 134/87 | HR 72 | Temp 98.1°F | Resp 16 | Ht 64.0 in | Wt 149.4 lb

## 2023-04-04 DIAGNOSIS — I639 Cerebral infarction, unspecified: Secondary | ICD-10-CM

## 2023-04-04 DIAGNOSIS — F17200 Nicotine dependence, unspecified, uncomplicated: Secondary | ICD-10-CM | POA: Diagnosis not present

## 2023-04-04 DIAGNOSIS — E010 Iodine-deficiency related diffuse (endemic) goiter: Secondary | ICD-10-CM | POA: Diagnosis not present

## 2023-04-05 NOTE — Progress Notes (Unsigned)
Established Patient Office Visit  Subjective    Patient ID: Kristin Montes, female    DOB: 11/30/1975  Age: 47 y.o. MRN: [id number]  CC:  Chief Complaint  Patient presents with   Medical Management of Chronic Issues    Patient is here for their 6 month follow-up Patient has concerns on ER visit Care gaps have been discussed with patient      HPI Kristin Montes presents for follow up of recent discharge 2/2 TIA/CVA. Patient left AMA after anxiety about getting MRI. Patient denies acute complaints.   Outpatient Encounter Medications as of 04/04/2023  Medication Sig   DULoxetine (CYMBALTA) 30 MG capsule Take 1 capsule by mouth twice daily   HYDROcodone-acetaminophen (NORCO) 7.5-325 MG tablet Take 1 tablet by mouth every 6 (six) hours.   amLODipine-valsartan (EXFORGE) 5-160 MG tablet Take 1 tablet by mouth daily. (Patient not taking: Reported on 04/04/2023)   cetirizine (ZYRTEC) 10 MG tablet Take 1 tablet (10 mg total) by mouth daily. (Patient not taking: Reported on 04/04/2023)   cyclobenzaprine (FLEXERIL) 10 MG tablet cyclobenzaprine 10 mg tablet  Take 1 tablet 3 times a day by oral route. (Patient not taking: Reported on 04/04/2023)   diclofenac (VOLTAREN) 75 MG EC tablet Take 1 tablet twice a day by oral route with meal(s). (Patient not taking: Reported on 04/04/2023)   fluticasone (FLONASE) 50 MCG/ACT nasal spray Place 2 sprays into both nostrils daily. (Patient not taking: Reported on 04/04/2023)   gabapentin (NEURONTIN) 300 MG capsule Take by mouth. (Patient not taking: Reported on 04/04/2023)   rizatriptan (MAXALT-MLT) 10 MG disintegrating tablet Take 1 tablet (10 mg total) by mouth as needed for migraine. May repeat in 2 hours if needed (Patient not taking: Reported on 04/04/2023)   topiramate (TOPAMAX) 25 MG tablet Take 1 tablet (25 mg total) by mouth at bedtime.   No facility-administered encounter medications on file as of 04/04/2023.    Past Medical History:  Diagnosis Date    Arthritis    Complication of anesthesia    slow to wake up with nephrectomy   Depression    Headache    History of kidney stones    Pneumonia     Past Surgical History:  Procedure Laterality Date   HERNIA REPAIR     NEPHRECTOMY     TOTAL HIP ARTHROPLASTY Left 08/18/2021   Procedure: TOTAL HIP ARTHROPLASTY ANTERIOR APPROACH;  Surgeon: Durene Romans, MD;  Location: [facility];  Service: Orthopedics;  Laterality: Left;   TUBAL LIGATION      No family history on file.  Social History   Socioeconomic History   Marital status: Single    Spouse name: Not on file   Number of children: Not on file   Years of education: Not on file   Highest education level: GED or equivalent  Occupational History   Not on file  Tobacco Use   Smoking status: Every Day    Current packs/day: 0.25    Types: Cigarettes    Passive exposure: Never   Smokeless tobacco: Never  Vaping Use   Vaping status: Never Used  Substance and Sexual Activity   Alcohol use: Not Currently   Drug use: Yes    Types: Marijuana    Comment: daily   Sexual activity: Not on file  Other Topics Concern   Not on file  Social History Narrative   Not on file   Social Determinants of Health   Financial Resource Strain: Low Risk  (03/31/2023)  Overall Financial Resource Strain (CARDIA)    Difficulty of Paying Living Expenses: Not hard at all  Food Insecurity: Food Insecurity Present (03/31/2023)   Hunger Vital Sign    Worried About Running Out of Food in the Last Year: Sometimes true    Ran Out of Food in the Last Year: Sometimes true  Transportation Needs: No Transportation Needs (03/31/2023)   PRAPARE - Administrator, Civil Service (Medical): No    Lack of Transportation (Non-Medical): No  Physical Activity: Insufficiently Active (03/31/2023)   Exercise Vital Sign    Days of Exercise per Week: 3 days    Minutes of Exercise per Session: 20 min  Stress: Stress Concern Present (03/31/2023)   [employer] of  Occupational Health - Occupational Stress Questionnaire    Feeling of Stress : Rather much  Social Connections: Moderately Isolated (03/31/2023)   Social Connection and Isolation Panel [NHANES]    Frequency of Communication with Friends and Family: More than three times a week    Frequency of Social Gatherings with Friends and Family: Once a week    Attends Religious Services: 1 to 4 times per year    Active Member of [employer] or Organizations: No    Attends Engineer, structural: Not on file    Marital Status: Never married  Intimate Partner Violence: Not on file    Review of Systems  All other systems reviewed and are negative.       Objective    BP 134/87   Pulse 72   Temp 98.1 F (36.7 C) (Oral)   Resp 16   Ht 5\' 4"  (1.626 m)   Wt 149 lb 6.4 oz (67.8 kg)   SpO2 93%   BMI 25.64 kg/m   Physical Exam Vitals and nursing note reviewed.  Constitutional:      General: She is not in acute distress. Cardiovascular:     Rate and Rhythm: Normal rate and regular rhythm.  Pulmonary:     Effort: Pulmonary effort is normal.     Breath sounds: Normal breath sounds.  Abdominal:     Palpations: Abdomen is soft.     Tenderness: There is no abdominal tenderness.  Neurological:     General: No focal deficit present.     Mental Status: She is alert and oriented to person, place, and time.         Assessment & Plan:   1. Thyromegaly Incidental finding on imaging recommended further workup beginning with u/s. - US THYROID; Future  2. Cerebrovascular accident (CVA), unspecified mechanism (HCC) Encouraged patient to complete workup by having MRI done. Symptoms seem to be improving slowly with speech being her primary concern.  - MR Brain Wo Contrast; Future  3. Smoking Discussed reduction/cessation    No follow-ups on file.   Tommie Raymond, MD

## 2023-04-06 ENCOUNTER — Ambulatory Visit
Admission: RE | Admit: 2023-04-06 | Discharge: 2023-04-06 | Disposition: A | Discharge status: Home / Self Care | Payer: Medicare Other | Source: Ambulatory Visit | Attending: Family Medicine | Admitting: Family Medicine

## 2023-04-06 ENCOUNTER — Encounter: Payer: Self-pay | Admitting: Family Medicine

## 2023-04-06 DIAGNOSIS — E010 Iodine-deficiency related diffuse (endemic) goiter: Secondary | ICD-10-CM

## 2023-04-24 ENCOUNTER — Ambulatory Visit
Admission: RE | Admit: 2023-04-24 | Discharge: 2023-04-24 | Disposition: A | Discharge status: Home / Self Care | Payer: Medicare Other | Source: Ambulatory Visit | Attending: Family Medicine | Admitting: Family Medicine

## 2023-04-24 DIAGNOSIS — I639 Cerebral infarction, unspecified: Secondary | ICD-10-CM

## 2023-04-27 ENCOUNTER — Other Ambulatory Visit: Payer: Self-pay | Admitting: Family Medicine

## 2023-04-27 MED ORDER — DULOXETINE HCL 30 MG PO CPEP: 30.0000 mg | ORAL_CAPSULE | Freq: Two times a day (BID) | ORAL | 0 refills | Status: DC

## 2023-05-09 ENCOUNTER — Telehealth: Payer: Self-pay | Admitting: Family Medicine

## 2023-05-09 ENCOUNTER — Telehealth: Payer: Self-pay

## 2023-05-09 NOTE — Telephone Encounter (Signed)
Call from MJ at [facility] radiology regarding scan today.  IMPRESSION: 1. Small acute cortically-based infarct within the posterior left frontal lobe (with involvement of the precentral gyrus). This is new from the prior brain MRI of 04/25/2021 and the signal characteristics are most consistent with a late subacute infarct. 2. 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 Some of these infarcts are new from the prior MRI.     Electronically Signed   By: Jackey Loge D.O.   On: 05/09/2023 16:56   Called Dr. Andrey Campanile to let her know of critical results.

## 2023-05-09 NOTE — Telephone Encounter (Signed)
Copied from CRM [id number]. Topic: General - Inquiry >> May 09, 2023  4:01 PM Runell Gess P wrote: Reason for CRM: Pt had a MRI of the brain on the 13th Oct and has not heard anything.  She is trying to go to PT but can not until she gets cleared/  please advise  CB@  [phone]

## 2023-05-10 ENCOUNTER — Other Ambulatory Visit: Payer: Self-pay | Admitting: Family Medicine

## 2023-05-10 DIAGNOSIS — Z8673 Personal history of transient ischemic attack (TIA), and cerebral infarction without residual deficits: Secondary | ICD-10-CM

## 2023-05-10 NOTE — Telephone Encounter (Signed)
Patient is aware of MRI results

## 2023-05-10 NOTE — Telephone Encounter (Signed)
Provider aware of results

## 2023-05-17 ENCOUNTER — Other Ambulatory Visit: Payer: Self-pay | Admitting: Neurology

## 2023-05-18 ENCOUNTER — Telehealth: Payer: Self-pay | Admitting: Family Medicine

## 2023-05-18 MED ORDER — TOPIRAMATE 25 MG PO TABS: 25.0000 mg | ORAL_TABLET | Freq: Every evening | ORAL | 6 refills | Status: DC

## 2023-05-23 ENCOUNTER — Telehealth: Payer: Self-pay | Admitting: Family Medicine

## 2023-05-24 ENCOUNTER — Ambulatory Visit (INDEPENDENT_AMBULATORY_CARE_PROVIDER_SITE_OTHER): Payer: Medicare Other | Admitting: Neurology

## 2023-05-24 ENCOUNTER — Encounter: Payer: Self-pay | Admitting: Neurology

## 2023-05-24 VITALS — BP 118/64 | Ht 64.0 in | Wt 154.0 lb

## 2023-05-24 DIAGNOSIS — F419 Anxiety disorder, unspecified: Secondary | ICD-10-CM

## 2023-05-24 DIAGNOSIS — R799 Abnormal finding of blood chemistry, unspecified: Secondary | ICD-10-CM

## 2023-05-24 DIAGNOSIS — I63312 Cerebral infarction due to thrombosis of left middle cerebral artery: Secondary | ICD-10-CM

## 2023-05-24 DIAGNOSIS — G43009 Migraine without aura, not intractable, without status migrainosus: Secondary | ICD-10-CM | POA: Diagnosis not present

## 2023-05-24 DIAGNOSIS — I63542 Cerebral infarction due to unspecified occlusion or stenosis of left cerebellar artery: Secondary | ICD-10-CM

## 2023-05-24 DIAGNOSIS — G5603 Carpal tunnel syndrome, bilateral upper limbs: Secondary | ICD-10-CM

## 2023-05-24 DIAGNOSIS — M542 Cervicalgia: Secondary | ICD-10-CM

## 2023-05-24 MED ORDER — ROSUVASTATIN CALCIUM 40 MG PO TABS
40.0000 mg | ORAL_TABLET | Freq: Every evening | ORAL | 11 refills | Status: DC
Start: 2023-05-24 — End: 2023-09-05

## 2023-05-24 MED ORDER — CLOPIDOGREL BISULFATE 75 MG PO TABS
75.0000 mg | ORAL_TABLET | Freq: Every day | ORAL | 0 refills | Status: DC
Start: 2023-05-24 — End: 2023-09-05

## 2023-05-24 NOTE — Progress Notes (Signed)
[facility]  PATIENT: Kristin Montes DOB: 1976/05/01  REQUESTING CLINICIAN: Georganna Skeans, MD HISTORY FROM: Patient  REASON FOR VISIT: Slurred, found to have a new stroke    HISTORICAL  CHIEF COMPLAINT:  Chief Complaint  Patient presents with   New Patient (Initial Visit)    Rm 13, NP CVA,    Discussed the use of AI scribe software for clinical note transcription with the patient, who gave verbal consent to proceed.  History of Present Illness   This is a 47 year old woman with past medical history of chronic neck and back pain status post-surgery, chronic migraines, bilateral carpal tunnel syndrome and previous stroke who is presenting after experiencing significant speech impairment that began while she was in [place] in early September. The patient reported that her speech became very slurred, to the point where she could hardly talk. She initially attributed this to the medications she had taken, including gabapentin and Diclofenac. However, the speech impairment persisted, and although it improved slightly over time, it did not fully resolve. The patient sought medical attention approximately five days after the onset of symptoms.  The patient presented to the ED 5 days later underwent a CT head and CTA head and neck which showed her previous stroke and stenosis of the right ICA. She was recommended MRI Brain but she left AMA. She had the Brain scan a month later, which revealed a new stroke on the left side of her brain. This was in addition to multiple previous strokes that had been identified in the past. She reported that she had not experienced any symptoms of high blood pressure until after a hip replacement surgery, after which she was started on amlodipine. The patient's blood pressure has been well-controlled on this medication. Currently she reports that her speech is almost back to normal, and she has no other deficits from her stroke.   The patient  also reported a history of carpal tunnel syndrome, for which she had undergone surgery on one hand earlier in the year. She was considering surgery for the other hand but had decided to delay this due to her recent health issues. The patient also reported frequent headaches, which she managed with hydrocodone as needed. She had tried other medications for her headaches, including Maxalt, but did not find them effective.  She does not drink alcohol but smokes cigarettes and tried to maintain an active lifestyle.       OTHER MEDICAL CONDITIONS: History of stroke, hypertension, chronic headaches, anxiety, Carpal tunnel syndrome   REVIEW OF SYSTEMS: Full 14 system review of systems performed and negative with exception of: As noted in the HPI   ALLERGIES: Allergies  Allergen Reactions   Latex Itching    Rash.   Diclofenac Other (See Comments)    HOME MEDICATIONS: Outpatient Medications Prior to Visit  Medication Sig Dispense Refill   cetirizine (ZYRTEC) 10 MG tablet Take 1 tablet (10 mg total) by mouth daily. 30 tablet 3   DULoxetine (CYMBALTA) 30 MG capsule Take 1 capsule (30 mg total) by mouth 2 (two) times daily. 180 capsule 0   fluticasone (FLONASE) 50 MCG/ACT nasal spray Place 2 sprays into both nostrils daily. 18 mL 2   HYDROcodone-acetaminophen (NORCO) 7.5-325 MG tablet Take 1 tablet by mouth every 6 (six) hours.     amLODipine-valsartan (EXFORGE) 5-160 MG tablet Take 1 tablet by mouth daily. (Patient not taking: Reported on 04/04/2023)     cyclobenzaprine (FLEXERIL) 10 MG tablet cyclobenzaprine 10 mg tablet  Take 1 tablet 3 times a day by oral route. (Patient not taking: Reported on 04/04/2023) 90 tablet 1   diclofenac (VOLTAREN) 75 MG EC tablet Take 1 tablet twice a day by oral route with meal(s). (Patient not taking: Reported on 04/04/2023)     gabapentin (NEURONTIN) 300 MG capsule Take by mouth. (Patient not taking: Reported on 04/04/2023)     rizatriptan (MAXALT-MLT) 10 MG  disintegrating tablet Take 1 tablet (10 mg total) by mouth as needed for migraine. May repeat in 2 hours if needed (Patient not taking: Reported on 04/04/2023) 10 tablet 3   topiramate (TOPAMAX) 25 MG tablet Take 1 tablet (25 mg total) by mouth at bedtime. (Patient not taking: Reported on 05/24/2023) 30 tablet 6   No facility-administered medications prior to visit.    PAST MEDICAL HISTORY: Past Medical History:  Diagnosis Date   Arthritis    Complication of anesthesia    slow to wake up with nephrectomy   Depression    Headache    History of kidney stones    Pneumonia     PAST SURGICAL HISTORY: Past Surgical History:  Procedure Laterality Date   CARPAL TUNNEL RELEASE Right    HERNIA REPAIR     NEPHRECTOMY     TOTAL HIP ARTHROPLASTY Left 08/18/2021   Procedure: TOTAL HIP ARTHROPLASTY ANTERIOR APPROACH;  Surgeon: Durene Romans, MD;  Location: [facility];  Service: Orthopedics;  Laterality: Left;   TUBAL LIGATION     UMBILICAL HERNIA REPAIR      FAMILY HISTORY: History reviewed. No pertinent family history.  SOCIAL HISTORY: Social History   Socioeconomic History   Marital status: Single    Spouse name: Not on file   Number of children: Not on file   Years of education: Not on file   Highest education level: GED or equivalent  Occupational History   Not on file  Tobacco Use   Smoking status: Every Day    Current packs/day: 0.25    Types: Cigarettes    Passive exposure: Never   Smokeless tobacco: Never  Vaping Use   Vaping status: Never Used  Substance and Sexual Activity   Alcohol use: Not Currently   Drug use: Yes    Types: Marijuana    Comment: daily   Sexual activity: Not on file  Other Topics Concern   Not on file  Social History Narrative   Right handed   Caffeine- 1 cup daily   single   Social Determinants of Health   Financial Resource Strain: Low Risk  (03/31/2023)   Overall Financial Resource Strain (CARDIA)    Difficulty of Paying Living Expenses:  Not hard at all  Food Insecurity: Food Insecurity Present (03/31/2023)   Hunger Vital Sign    Worried About Running Out of Food in the Last Year: Sometimes true    Ran Out of Food in the Last Year: Sometimes true  Transportation Needs: No Transportation Needs (03/31/2023)   PRAPARE - Administrator, Civil Service (Medical): No    Lack of Transportation (Non-Medical): No  Physical Activity: Insufficiently Active (03/31/2023)   Exercise Vital Sign    Days of Exercise per Week: 3 days    Minutes of Exercise per Session: 20 min  Stress: Stress Concern Present (03/31/2023)   [employer] of Occupational Health - Occupational Stress Questionnaire    Feeling of Stress : Rather much  Social Connections: Moderately Isolated (03/31/2023)   Social Connection and Isolation Panel [NHANES]    Frequency  of Communication with Friends and Family: More than three times a week    Frequency of Social Gatherings with Friends and Family: Once a week    Attends Religious Services: 1 to 4 times per year    Active Member of [employer] or Organizations: No    Attends Engineer, structural: Not on file    Marital Status: Never married  Intimate Partner Violence: Not on file    PHYSICAL EXAM  GENERAL EXAM/CONSTITUTIONAL: Vitals:  Vitals:   05/24/23 1459  BP: 118/64  Weight: 154 lb (69.9 kg)  Height: 5\' 4"  (1.626 m)   Body mass index is 26.43 kg/m. Wt Readings from Last 3 Encounters:  05/24/23 154 lb (69.9 kg)  04/04/23 149 lb 6.4 oz (67.8 kg)  03/25/23 155 lb (70.3 kg)   Patient is in no distress; well developed, nourished and groomed; neck is supple  MUSCULOSKELETAL: Gait, strength, tone, movements noted in Neurologic exam below  NEUROLOGIC: MENTAL STATUS:      No data to display         awake, alert, oriented to person, place and time recent and remote memory intact normal attention and concentration language fluent, comprehension intact, naming intact. No aphasia or  dysarthria noted.  fund of knowledge appropriate  CRANIAL NERVE:  2nd, 3rd, 4th, 6th - Visual fields full to confrontation, extraocular muscles intact, no nystagmus 5th - facial sensation symmetric 7th - facial strength symmetric 8th - hearing intact 9th - palate elevates symmetrically, uvula midline 11th - shoulder shrug symmetric 12th - tongue protrusion midline  MOTOR:  normal bulk and tone, full strength in the BUE, BLE  SENSORY:  normal and symmetric to light touch  COORDINATION:  finger-nose-finger, fine finger movements normal  REFLEXES:  deep tendon reflexes present and symmetric  GAIT/STATION:  normal     DIAGNOSTIC DATA (LABS, IMAGING, TESTING) - I reviewed patient records, labs, notes, testing and imaging myself where available.  Lab Results  Component Value Date   WBC 6.5 03/25/2023   HGB 12.7 03/25/2023   HCT 38.6 03/25/2023   MCV 96.0 03/25/2023   PLT 205 03/25/2023      Component Value Date/Time   NA 137 03/25/2023 1353   K 3.8 03/25/2023 1353   CL 103 03/25/2023 1353   CO2 25 03/25/2023 1353   GLUCOSE 90 03/25/2023 1353   BUN 10 03/25/2023 1353   CREATININE 0.78 03/25/2023 1353   CALCIUM 8.7 (L) 03/25/2023 1353   PROT 7.0 08/14/2021 0946   ALBUMIN 3.8 08/14/2021 0946   AST 25 08/14/2021 0946   ALT 33 08/14/2021 0946   ALKPHOS 106 08/14/2021 0946   BILITOT 0.3 08/14/2021 0946   GFRNONAA >60 03/25/2023 1353   GFRAA >90 12/16/2013 1440   No results found for: "CHOL", "HDL", "LDLCALC", "LDLDIRECT", "TRIG", "CHOLHDL" No results found for: "HGBA1C" No results found for: "VITAMINB12" No results found for: "TSH"  CTA Head and Neck 03/25/2023 1. No acute intracranial abnormality. 2. 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 Small areas of encephalomalacia and gliosis are also seen anterior left basal ganglia region and left cerebellar hemisphere. 3. String opacification of the right ICA in the cervical and vertical  petrous segment with occlusion seen from the horizontal petrous to the supraclinoid segment. Reconstitution of flow is seen via prominent right posterior communicating artery. This is likely a chronic finding given abnormal flow void seen on prior MRI of the brain performed October 2022. 4. Asymmetry of the anterior circulation with small  caliber of the right MCA and right ACA vascular tree compared to the contralateral, related to right internal carotid artery occlusion. 5. Mild atherosclerotic changes of the left carotid bifurcation without stenosis.  MRI Brain 04/24/2023 1. Small acute cortically-based infarct within the posterior left frontal lobe (with involvement of the precentral gyrus). This is new from the prior brain MRI of 04/25/2021 and the signal characteristics are most consistent with a late subacute infarct. 2. 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 Some of these infarcts are new from the prior MRI.   Assessment and Plan    Ischemic Stroke A new ischemic stroke in the left frontal lobe was identified on MRI, presenting with slurred speech and difficulty forming sentences. She has a history of multiple strokes in different brain regions but no prior hypertension until after hip replacement surgery, and no history of blood clots or miscarriages.  Emphasis was placed on medication adherence to prevent future strokes and the severe risks associated with non-treatment, including, new stroke with paralysis or disability. Lifestyle changes such as smoking cessation and regular exercise were discussed to reduce stroke risk. We will prescribe aspirin daily for life unless a GI bleed occurs, Plavix for three months, and Crestor at night. A lipid panel and hemoglobin A1c will be ordered, along with an echocardiogram. Smoking cessation is advised, and 20 minutes of exercise  daily, five days a week, is recommended. A follow-up is scheduled in eight months.  Carotid Artery Stenosis Narrowing of the right carotid artery was identified, not related to the recent stroke on the left side. It requires medical management to prevent worsening, with aspirin and Plavix proving and lipid lowering agent. Aspirin and Plavix will continue as previously outlined, with follow-up imaging monitored as needed.  Headaches Chronic headaches, possibly related to neck pain, were discussed. Previous medications like Maxalt were not well tolerated, and hydrocodone has been used for pain relief. Topamax for headache prevention will be prescribed to be taken at night, with hydrocodone allowed for pain as needed.  General Health Maintenance The importance of lifestyle modifications was discussed to prevent further strokes and manage overall health, including smoking cessation, regular exercise (20 minutes daily, five days a week), and advice on a healthy diet and weight management.  Follow-up A follow-up appointment is scheduled in eight months, with orders for blood work (lipid panel, hemoglobin A1c) and an echocardiogram.       1. Cerebrovascular accident (CVA) due to thrombosis of left middle cerebral artery (HCC)   2. Abnormal finding of blood chemistry, unspecified   3. 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 Migraine without aura and without status migrainosus, not intractable   5. Bilateral carpal tunnel syndrome   6. Cervicalgia   7. Anxiety      Patient Instructions  Start aspirin and Plavix for total of 90 days, then continue with aspirin alone Start Crestor 40 mg nightly Will check a fasting lipid panel and hemoglobin A1c Echocardiogram Will contact you to go over the result Continue to follow up with PCP  Follow-up in 8 months or sooner if worse.  Orders Placed This Encounter  Procedures   Lipid panel   Hemoglobin A1c   ECHOCARDIOGRAM COMPLETE     Meds ordered this encounter  Medications   clopidogrel (PLAVIX) 75 MG tablet    Sig: Take 1 tablet (75 mg total) by mouth  daily.    Dispense:  90 tablet    Refill:  0   rosuvastatin (CRESTOR) 40 MG tablet    Sig: Take 1 tablet (40 mg total) by mouth at bedtime.    Dispense:  30 tablet    Refill:  11    Return in about 8 months (around 01/21/2024).  I have spent a total of 60 minutes dedicated to this patient today, preparing to see patient, performing a medically appropriate examination and evaluation, ordering tests and/or medications and procedures, and counseling and educating the patient/family/caregiver; independently interpreting result and communicating results to the family/patient/caregiver; and documenting clinical information in the electronic medical record.   [name], MD 05/24/2023, 4:15 PM  [facility] [address], [address] [place], [place] [address] [phone]

## 2023-05-24 NOTE — Patient Instructions (Addendum)
Start aspirin and Plavix for total of 90 days, then continue with aspirin alone Start Crestor 40 mg nightly Will check a fasting lipid panel and hemoglobin A1c Echocardiogram Will contact you to go over the result Continue to follow up with PCP  Follow-up in 8 months or sooner if worse.

## 2023-05-25 ENCOUNTER — Other Ambulatory Visit (INDEPENDENT_AMBULATORY_CARE_PROVIDER_SITE_OTHER): Payer: Self-pay

## 2023-05-25 DIAGNOSIS — I63312 Cerebral infarction due to thrombosis of left middle cerebral artery: Secondary | ICD-10-CM

## 2023-05-25 DIAGNOSIS — R799 Abnormal finding of blood chemistry, unspecified: Secondary | ICD-10-CM

## 2023-05-25 DIAGNOSIS — Z0289 Encounter for other administrative examinations: Secondary | ICD-10-CM

## 2023-05-26 LAB — LIPID PANEL
Chol/HDL Ratio: 4.8 ratio — ABNORMAL HIGH (ref 0.0–4.4)
Cholesterol, Total: 267 mg/dL — ABNORMAL HIGH (ref 100–199)
HDL: 56 mg/dL (ref >39)
LDL Chol Calc (NIH): 175 mg/dL — ABNORMAL HIGH (ref 0–99)
Triglycerides: 192 mg/dL — ABNORMAL HIGH (ref 0–149)
VLDL Cholesterol Cal: 36 mg/dL (ref 5–40)

## 2023-05-26 LAB — HEMOGLOBIN A1C
Est. average glucose Bld gHb Est-mCnc: 131 mg/dL
Hgb A1c MFr Bld: 6.2 % — ABNORMAL HIGH (ref 4.8–5.6)

## 2023-05-27 ENCOUNTER — Telehealth: Payer: Self-pay | Admitting: Neurology

## 2023-05-27 NOTE — Telephone Encounter (Signed)
Pt called wanting to know how many MG's of asprin is she to take. She states that she has been taking 325mg  and is wondering if that is to much. Please advise.

## 2023-05-30 NOTE — Telephone Encounter (Signed)
Call to patient, no answer. Left message with Dr. Teresa Coombs recommendations on aspirin and also advised I would send mychart message. Left number for call back if patient wishes to call back

## 2023-05-30 NOTE — Telephone Encounter (Signed)
Aspirin 81mg daily

## 2023-06-06 ENCOUNTER — Telehealth: Payer: Self-pay | Admitting: Neurology

## 2023-06-06 ENCOUNTER — Other Ambulatory Visit: Payer: Self-pay | Admitting: Neurology

## 2023-06-06 MED ORDER — PROPRANOLOL HCL ER 60 MG PO CP24: 60.0000 mg | ORAL_CAPSULE | Freq: Every day | ORAL | 11 refills | Status: DC

## 2023-06-06 NOTE — Telephone Encounter (Signed)
Call to patient, she reports a headache since seeing Dr. Teresa Coombs and turned into a migraine Friday night and she took a maxalt that only gave herr relief for an hour. She has been taking the topamax daily since visit with Dr Teresa Coombs and doesn't feel it has helped but maybe nade it worse She did not take it last night and she did not wake up with headache. Advised I would send to Dr. Teresa Coombs to review for different medications for headaches and migraines.

## 2023-06-06 NOTE — Progress Notes (Signed)
Topiramate made headaches worse per patient, will switch her to Propanolol XR. She is already on Amlodipine. Advise patient to discontinue medication if she experiences dizziness.   Dr Teresa Coombs

## 2023-06-06 NOTE — Telephone Encounter (Signed)
Please advise patient to discontinue Topiramate and start Propanolol 60 mg at bedtime. Advise he to discontinue the medication if she experience any dizziness,worsening fatigue or any new side effects.   Dr. Teresa Coombs

## 2023-06-06 NOTE — Telephone Encounter (Signed)
Pt calling to report that topiramate (TOPAMAX) 25 MG tablet is no longer helping her, she is asking for a call to discuss

## 2023-06-20 ENCOUNTER — Telehealth: Payer: Self-pay | Admitting: Family Medicine

## 2023-07-20 DIAGNOSIS — M542 Cervicalgia: Secondary | ICD-10-CM | POA: Diagnosis not present

## 2023-07-20 DIAGNOSIS — M5412 Radiculopathy, cervical region: Secondary | ICD-10-CM | POA: Diagnosis not present

## 2023-07-21 ENCOUNTER — Telehealth: Payer: Self-pay | Admitting: Family Medicine

## 2023-07-21 NOTE — Telephone Encounter (Signed)
 Kristin Montes

## 2023-07-25 ENCOUNTER — Other Ambulatory Visit: Payer: Self-pay | Admitting: Family Medicine

## 2023-07-28 DIAGNOSIS — G5602 Carpal tunnel syndrome, left upper limb: Secondary | ICD-10-CM | POA: Diagnosis not present

## 2023-07-29 ENCOUNTER — Telehealth: Payer: Self-pay | Admitting: Family Medicine

## 2023-07-29 NOTE — Telephone Encounter (Signed)
 Kristin Montes

## 2023-08-04 ENCOUNTER — Ambulatory Visit: Payer: Medicare HMO

## 2023-08-04 DIAGNOSIS — Z0000 Encounter for general adult medical examination without abnormal findings: Secondary | ICD-10-CM | POA: Diagnosis not present

## 2023-08-04 NOTE — Progress Notes (Signed)
Subjective:   Kristin Montes is a 48 y.o. female who presents for Medicare Annual (Subsequent) preventive examination.  Visit Complete: Virtual I connected with  [name] on 08/04/23 by a audio enabled telemedicine application and verified that I am speaking with the correct person using two identifiers.  Interactive audio and video telecommunications were attempted between this provider and patient, however failed, due to patient having technical difficulties OR patient did not have access to video capability.  We continued and completed visit with audio only.  Patient Location: Home  Provider Location: Home Office  I discussed the limitations of evaluation and management by telemedicine. The patient expressed understanding and agreed to proceed.  Vital Signs: Because this visit was a virtual/telehealth visit, some criteria may be missing or patient reported. Any vitals not documented were not able to be obtained and vitals that have been documented are patient reported.    Cardiac Risk Factors include: none     Objective:    Today's Vitals   There is no height or weight on file to calculate BMI.     08/04/2023   10:14 AM 03/25/2023   11:54 AM 10/13/2021    1:46 PM 08/18/2021    1:02 PM 08/14/2021    2:01 PM 10/14/2020   12:03 PM 04/27/2018    2:28 PM  Advanced Directives  Does Patient Have a Medical Advance Directive? No No No No No No No  Would patient like information on creating a medical advance directive? No - Patient declined No - Patient declined No - Patient declined No - Patient declined       Current Medications (verified) Outpatient Encounter Medications as of 08/04/2023  Medication Sig   amLODipine-valsartan (EXFORGE) 5-160 MG tablet Take 1 tablet by mouth daily.   clopidogrel (PLAVIX) 75 MG tablet Take 1 tablet (75 mg total) by mouth daily.   DULoxetine (CYMBALTA) 30 MG capsule Take 1 capsule by mouth twice daily   HYDROcodone-acetaminophen (NORCO) 7.5-325 MG  tablet Take 1 tablet by mouth every 6 (six) hours.   propranolol ER (INDERAL LA) 60 MG 24 hr capsule Take 1 capsule (60 mg total) by mouth daily.   rizatriptan (MAXALT-MLT) 10 MG disintegrating tablet Take 1 tablet (10 mg total) by mouth as needed for migraine. May repeat in 2 hours if needed   cetirizine (ZYRTEC) 10 MG tablet Take 1 tablet (10 mg total) by mouth daily. (Patient not taking: Reported on 08/04/2023)   cyclobenzaprine (FLEXERIL) 10 MG tablet cyclobenzaprine 10 mg tablet  Take 1 tablet 3 times a day by oral route. (Patient not taking: Reported on 08/04/2023)   diclofenac (VOLTAREN) 75 MG EC tablet Take 1 tablet twice a day by oral route with meal(s). (Patient not taking: Reported on 08/04/2023)   fluticasone (FLONASE) 50 MCG/ACT nasal spray Place 2 sprays into both nostrils daily. (Patient not taking: Reported on 08/04/2023)   gabapentin (NEURONTIN) 300 MG capsule Take by mouth. (Patient not taking: Reported on 08/04/2023)   rosuvastatin (CRESTOR) 40 MG tablet Take 1 tablet (40 mg total) by mouth at bedtime.   No facility-administered encounter medications on file as of 08/04/2023.    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 No pertinent family history. Social History   Socioeconomic History   Marital status: Single    Spouse name: Not on file   Number of children: Not on file   Years of education: Not  on file   Highest education level: GED or equivalent  Occupational History   Not on file  Tobacco Use   Smoking status: Every Day    Current packs/day: 0.25    Types: Cigarettes    Passive exposure: Never   Smokeless tobacco: Never  Vaping Use   Vaping status: Never Used  Substance and Sexual Activity   Alcohol use: Not Currently   Drug use: Yes    Types: Marijuana    Comment: daily   Sexual activity: Not on file  Other Topics Concern   Not on file  Social History Narrative   Right handed   Caffeine- 1 cup daily   single   Social Drivers of Health   Financial Resource Strain: Low Risk  (08/04/2023)   Overall Financial Resource Strain (CARDIA)    Difficulty of Paying Living Expenses: Not hard at all  Food Insecurity: No Food Insecurity (08/04/2023)   Hunger Vital Sign    Worried About Running Out of Food in the Last Year: Never true    Ran Out of Food in the Last Year: Never true  Transportation Needs: No Transportation Needs (08/04/2023)   PRAPARE - Administrator, Civil Service (Medical): No    Lack of Transportation (Non-Medical): No  Physical Activity: Inactive (08/04/2023)   Exercise Vital Sign    Days of Exercise per Week: 0 days    Minutes of Exercise per Session: 0 min  Stress: No Stress Concern Present (08/04/2023)   [employer] of Occupational Health - Occupational Stress Questionnaire    Feeling of Stress : Only a little  Social Connections: Moderately Isolated (08/04/2023)   Social Connection and Isolation Panel [NHANES]    Frequency of Communication with Friends and Family: More than three times a week    Frequency of Social Gatherings with Friends and Family: Once a week    Attends Religious Services: More than 4 times per year    Active Member of [employer] or Organizations: No    Attends Engineer, structural: Never    Marital Status: Never married    Tobacco Counseling Ready to quit: Yes Counseling given: Not  Answered   Clinical Intake:  Pre-visit preparation completed: Yes  Pain : No/denies pain     Nutritional Risks: None Diabetes: No  How often do you need to have someone help you when you read instructions, pamphlets, or other written materials from your doctor or pharmacy?: 1 - Never  Interpreter Needed?: No  Information entered by :: NAllen LPN   Activities of Daily Living    08/04/2023   10:07 AM  In your present state of health, do you have any difficulty performing the following activities:  Hearing? 0  Vision? 0  Difficulty concentrating or making decisions? 0  Walking or climbing stairs? 1  Dressing or bathing? 0  Doing errands, shopping? 0  Preparing  Food and eating ? N  Using the Toilet? N  In the past six months, have you accidently leaked urine? N  Do you have problems with loss of bowel control? N  Managing your Medications? N  Managing your Finances? N  Housekeeping or managing your Housekeeping? N    Patient Care Team: Georganna Skeans, MD as PCP - General (Family Medicine)  Indicate any recent Medical Services you may have received from other than [facility] providers in the past year (date may be approximate).     Assessment:   This is a routine wellness examination for Edyth.  Hearing/Vision screen Hearing Screening - Comments:: Denies hearing issues Vision Screening - Comments:: Regular eye exams, [facility]   Goals Addressed             This Visit's Progress    Patient Stated       08/04/2023, wants to be more independent       Depression Screen    08/04/2023   10:15 AM 04/04/2023    9:47 AM 08/09/2022    2:34 PM 05/06/2022    1:25 PM 02/04/2022   10:35 AM 11/05/2021   11:40 AM 10/05/2021    9:09 AM  PHQ 2/9 Scores  PHQ - 2 Score 0 0 0 4 4 5 5   PHQ- 9 Score   0 14 15 23 20     Fall Risk    08/04/2023   10:14 AM 04/04/2023    9:47 AM 05/06/2022    1:25 PM 07/16/2021   11:08 AM 06/02/2021   11:56 AM  Fall Risk   Falls in the  past year? 0 0 0 0 0  Number falls in past yr: 0 0 0 0 0  Injury with Fall? 0 0 0 0   Risk for fall due to : Medication side effect  No Fall Risks    Follow up Falls prevention discussed;Falls evaluation completed  Falls evaluation completed      MEDICARE RISK AT HOME: Medicare Risk at Home Any stairs in or around the home?: No If so, are there any without handrails?: No Home free of loose throw rugs in walkways, pet beds, electrical cords, etc?: Yes Adequate lighting in your home to reduce risk of falls?: Yes Life alert?: No Use of a cane, walker or w/c?: Yes Grab bars in the bathroom?: Yes Shower chair or bench in shower?: No Elevated toilet seat or a handicapped toilet?: No  TIMED UP AND GO:  Was the test performed?  No    Cognitive Function:        08/04/2023   10:15 AM  6CIT Screen  What Year? 0 points  What month? 0 points  What time? 0 points  Count back from 20 0 points  Months in reverse 0 points  Repeat phrase 0 points  Total Score 0 points    Immunizations Immunization History  Administered Date(s) Administered   PPD Test 04/29/2017    TDAP status: Due, Education has been provided regarding the importance of this vaccine. Advised may receive this vaccine at local pharmacy or Health Dept. Aware to provide a copy of the vaccination record if obtained from local pharmacy or Health Dept. Verbalized acceptance and understanding.  Flu Vaccine status: Declined, Education has been provided regarding the importance of this vaccine but patient still declined. Advised may receive this vaccine at local pharmacy or Health Dept. Aware to provide a copy of the vaccination record if obtained from local pharmacy or Health  Dept. Verbalized acceptance and understanding.  Pneumococcal vaccine status: Declined,  Education has been provided regarding the importance of this vaccine but patient still declined. Advised may receive this vaccine at local pharmacy or Health Dept. Aware  to provide a copy of the vaccination record if obtained from local pharmacy or Health Dept. Verbalized acceptance and understanding.   Covid-19 vaccine status: Declined, Education has been provided regarding the importance of this vaccine but patient still declined. Advised may receive this vaccine at local pharmacy or Health Dept.or vaccine clinic. Aware to provide a copy of the vaccination record if obtained from local pharmacy or Health Dept. Verbalized acceptance and understanding.  Qualifies for Shingles Vaccine? No   Zostavax completed  n/a   Shingrix Completed?: n/a  Screening Tests Health Maintenance  Topic Date Due   HIV Screening  Never done   Hepatitis C Screening  Never done   Medicare Annual Wellness (AWV)  08/03/2024   Cervical Cancer Screening (HPV/Pap Cotest)  12/15/2025   HPV VACCINES  Aged Out   DTaP/Tdap/Td  Discontinued   Pneumococcal Vaccine 68-66 Years old  Discontinued   INFLUENZA VACCINE  Discontinued   Colonoscopy  Discontinued   COVID-19 Vaccine  Discontinued    Health Maintenance  Health Maintenance Due  Topic Date Due   HIV Screening  Never done   Hepatitis C Screening  Never done    Colorectal cancer screening: declines   Mammogram status: declines at this time  Bone Density status: n/a  Lung Cancer Screening: (Low Dose CT Chest recommended if Age 71-80 years, 20 pack-year currently smoking OR have quit w/in 15years.) does not qualify.   Lung Cancer Screening Referral: no  Additional Screening:  Hepatitis C Screening: does not qualify;  Vision Screening: Recommended annual ophthalmology exams for early detection of glaucoma and other disorders of the eye. Is the patient up to date with their annual eye exam?  Yes  Who is the provider or what is the name of the office in which the patient attends annual eye exams? [facility] If pt is not established with a provider, would they like to be referred to a provider to establish care? No .    Dental Screening: Recommended annual dental exams for proper oral hygiene  Diabetic Foot Exam: n/a  Community Resource Referral / Chronic Care Management: CRR required this visit?  No   CCM required this visit?  No     Plan:     I have personally reviewed and noted the following in the patient's chart:   Medical and social history Use of alcohol, tobacco or illicit drugs  Current medications and supplements including opioid prescriptions. Patient is currently taking opioid prescriptions. Information provided to patient regarding non-opioid alternatives. Patient advised to discuss non-opioid treatment plan with their provider. Functional ability and status Nutritional status Physical activity Advanced directives List of other physicians Hospitalizations, surgeries, and ER visits in previous 12 months Vitals Screenings to include cognitive, depression, and falls Referrals and appointments  In addition, I have reviewed and discussed with patient certain preventive protocols, quality metrics, and best practice recommendations. A written personalized care plan for preventive services as well as general preventive health recommendations were provided to patient.     Barb Merino, LPN   [id number]   After Visit Summary: (MyChart) Due to this being a telephonic visit, the after visit summary with patients personalized plan was offered to patient via MyChart   Nurse Notes: none

## 2023-08-04 NOTE — Patient Instructions (Signed)
Ms. Kristin Montes , Thank you for taking time to come for your Medicare Wellness Visit. I appreciate your ongoing commitment to your health goals. Please review the following plan we discussed and let me know if I can assist you in the future.   Referrals/Orders/Follow-Ups/Clinician Recommendations: none  Managing Pain Without Opioids Opioids are strong medicines used to treat moderate to severe pain. For some people, especially those who have long-term (chronic) pain, opioids may not be the best choice for pain management due to: Side effects like nausea, constipation, and sleepiness. The risk of addiction (opioid use disorder). The longer you take opioids, the greater your risk of addiction. Pain that lasts for more than 3 months is called chronic pain. Managing chronic pain usually requires more than one approach and is often provided by a team of health care providers working together (multidisciplinary approach). Pain management may be done at a pain management center or pain clinic. How to manage pain without the use of opioids Use non-opioid medicines Non-opioid medicines for pain may include: Over-the-counter or prescription non-steroidal anti-inflammatory drugs (NSAIDs). These may be the first medicines used for pain. They work well for muscle and bone pain, and they reduce swelling. Acetaminophen. This over-the-counter medicine may work well for milder pain but not swelling. Antidepressants. These may be used to treat chronic pain. A certain type of antidepressant (tricyclics) is often used. These medicines are given in lower doses for pain than when used for depression. Anticonvulsants. These are usually used to treat seizures but may also reduce nerve (neuropathic) pain. Muscle relaxants. These relieve pain caused by sudden muscle tightening (spasms). You may also use a pain medicine that is applied to the skin as a patch, cream, or gel (topical analgesic), such as a numbing medicine. These  may cause fewer side effects than medicines taken by mouth. Do certain therapies as directed Some therapies can help with pain management. They include: Physical therapy. You will do exercises to gain strength and flexibility. A physical therapist may teach you exercises to move and stretch parts of your body that are weak, stiff, or painful. You can learn these exercises at physical therapy visits and practice them at home. Physical therapy may also involve: Massage. Heat wraps or applying heat or cold to affected areas. Electrical signals that interrupt pain signals (transcutaneous electrical nerve stimulation, TENS). Weak lasers that reduce pain and swelling (low-level laser therapy). Signals from your body that help you learn to regulate pain (biofeedback). Occupational therapy. This helps you to learn ways to function at home and work with less pain. Recreational therapy. This involves trying new activities or hobbies, such as a physical activity or drawing. Mental health therapy, including: Cognitive behavioral therapy (CBT). This helps you learn coping skills for dealing with pain. Acceptance and commitment therapy (ACT) to change the way you think and react to pain. Relaxation therapies, including muscle relaxation exercises and mindfulness-based stress reduction. Pain management counseling. This may be individual, family, or group counseling.  Receive medical treatments Medical treatments for pain management include: Nerve block injections. These may include a pain blocker and anti-inflammatory medicines. You may have injections: Near the spine to relieve chronic back or neck pain. Into joints to relieve back or joint pain. Into nerve areas that supply a painful area to relieve body pain. Into muscles (trigger point injections) to relieve some painful muscle conditions. A medical device placed near your spine to help block pain signals and relieve nerve pain or chronic back pain  (spinal  cord stimulation device). Acupuncture. Follow these instructions at home Medicines Take over-the-counter and prescription medicines only as told by your health care provider. If you are taking pain medicine, ask your health care providers about possible side effects to watch out for. Do not drive or use heavy machinery while taking prescription opioid pain medicine. Lifestyle  Do not use drugs or alcohol to reduce pain. If you drink alcohol, limit how much you have to: 0-1 drink a day for women who are not pregnant. 0-2 drinks a day for men. Know how much alcohol is in a drink. In the [place] one drink equals one 12 oz bottle of beer (355 mL), one 5 oz glass of wine (148 mL), or one 1 oz glass of hard liquor (44 mL). Do not use any products that contain nicotine or tobacco. These products include cigarettes, chewing tobacco, and vaping devices, such as e-cigarettes. If you need help quitting, ask your health care provider. Eat a healthy diet and maintain a healthy weight. Poor diet and excess weight may make pain worse. Eat foods that are high in fiber. These include fresh fruits and vegetables, whole grains, and beans. Limit foods that are high in fat and processed sugars, such as fried and sweet foods. Exercise regularly. Exercise lowers stress and may help relieve pain. Ask your health care provider what activities and exercises are safe for you. If your health care provider approves, join an exercise class that combines movement and stress reduction. Examples include yoga and tai chi. Get enough sleep. Lack of sleep may make pain worse. Lower stress as much as possible. Practice stress reduction techniques as told by your therapist. General instructions Work with all your pain management providers to find the treatments that work best for you. You are an important member of your pain management team. There are many things you can do to reduce pain on your own. Consider joining an  online or in-person support group for people who have chronic pain. Keep all follow-up visits. This is important. Where to find more information You can find more information about managing pain without opioids from: American Academy of Pain Medicine: painmed.org Institute for Chronic Pain: instituteforchronicpain.org American Chronic Pain Association: theacpa.org Contact a health care provider if: You have side effects from pain medicine. Your pain gets worse or does not get better with treatments or home therapy. You are struggling with anxiety or depression. Summary Many types of pain can be managed without opioids. Chronic pain may respond better to pain management without opioids. Pain is best managed when you and a team of health care providers work together. Pain management without opioids may include non-opioid medicines, medical treatments, physical therapy, mental health therapy, and lifestyle changes. Kristin Montes your health care providers if your pain gets worse or is not being managed well enough. This information is not intended to replace advice given to you by your health care provider. Make sure you discuss any questions you have with your health care provider. Document Revised: 10/08/2020 Document Reviewed: 10/08/2020 Elsevier Patient Education  2024 [employer].  This is a list of the screening recommended for you and due dates:  Health Maintenance  Topic Date Due   HIV Screening  Never done   Hepatitis C Screening  Never done   Medicare Annual Wellness Visit  08/03/2024   Pap with HPV screening  12/15/2025   HPV Vaccine  Aged Out   DTaP/Tdap/Td vaccine  Discontinued   Pneumococcal Vaccination  Discontinued   Flu  Shot  Discontinued   Colon Cancer Screening  Discontinued   COVID-19 Vaccine  Discontinued    Advanced directives: (ACP Link)Information on Advanced Care Planning can be found at [facility] [place] Advance Health Care Directives Advance Health  Care Directives ([email or web address]   Next Medicare Annual Wellness Visit scheduled for next year: Yes  insert Preventive Care attachment Insert FALL PREVENTION attachment if needed

## 2023-08-16 ENCOUNTER — Other Ambulatory Visit: Payer: Self-pay | Admitting: Neurology

## 2023-08-26 ENCOUNTER — Ambulatory Visit: Payer: Medicare HMO | Admitting: Family Medicine

## 2023-08-31 ENCOUNTER — Ambulatory Visit (INDEPENDENT_AMBULATORY_CARE_PROVIDER_SITE_OTHER): Payer: Medicare HMO | Admitting: Family Medicine

## 2023-08-31 ENCOUNTER — Telehealth: Payer: Self-pay

## 2023-08-31 ENCOUNTER — Encounter: Payer: Self-pay | Admitting: Family Medicine

## 2023-08-31 VITALS — BP 121/83 | HR 63 | Temp 98.0°F | Resp 16 | Ht 64.0 in | Wt 157.6 lb

## 2023-08-31 DIAGNOSIS — Z1211 Encounter for screening for malignant neoplasm of colon: Secondary | ICD-10-CM

## 2023-08-31 DIAGNOSIS — Z1231 Encounter for screening mammogram for malignant neoplasm of breast: Secondary | ICD-10-CM

## 2023-08-31 DIAGNOSIS — F17200 Nicotine dependence, unspecified, uncomplicated: Secondary | ICD-10-CM

## 2023-08-31 DIAGNOSIS — N951 Menopausal and female climacteric states: Secondary | ICD-10-CM | POA: Diagnosis not present

## 2023-08-31 MED ORDER — PAROXETINE HCL 20 MG PO TABS: 20.0000 mg | ORAL_TABLET | Freq: Every day | ORAL | 2 refills | Status: DC

## 2023-08-31 NOTE — Progress Notes (Unsigned)
Established Patient Office Visit  Subjective    Patient ID: Kristin Montes, female    DOB: 1976/01/13  Age: 48 y.o. MRN: [id number]  CC:  Chief Complaint  Patient presents with   Follow-up    New medication    HPI Kristin Montes presents for complaint of night sweats and hot flashes and decreased libido. Last menses was 2020.   Outpatient Encounter Medications as of 08/31/2023  Medication Sig   amLODipine-valsartan (EXFORGE) 5-160 MG tablet Take 1 tablet by mouth daily.   DULoxetine (CYMBALTA) 30 MG capsule Take 1 capsule by mouth twice daily   HYDROcodone-acetaminophen (NORCO) 7.5-325 MG tablet Take 1 tablet by mouth every 6 (six) hours.   propranolol ER (INDERAL LA) 60 MG 24 hr capsule Take 1 capsule (60 mg total) by mouth daily.   rizatriptan (MAXALT-MLT) 10 MG disintegrating tablet Take 1 tablet (10 mg total) by mouth as needed for migraine. May repeat in 2 hours if needed   cetirizine (ZYRTEC) 10 MG tablet Take 1 tablet (10 mg total) by mouth daily. (Patient not taking: Reported on 08/04/2023)   clopidogrel (PLAVIX) 75 MG tablet Take 1 tablet (75 mg total) by mouth daily.   cyclobenzaprine (FLEXERIL) 10 MG tablet cyclobenzaprine 10 mg tablet  Take 1 tablet 3 times a day by oral route. (Patient not taking: Reported on 08/04/2023)   diclofenac (VOLTAREN) 75 MG EC tablet Take 1 tablet twice a day by oral route with meal(s). (Patient not taking: Reported on 08/04/2023)   fluticasone (FLONASE) 50 MCG/ACT nasal spray Place 2 sprays into both nostrils daily. (Patient not taking: Reported on 08/04/2023)   gabapentin (NEURONTIN) 300 MG capsule Take by mouth. (Patient not taking: Reported on 08/04/2023)   rosuvastatin (CRESTOR) 40 MG tablet Take 1 tablet (40 mg total) by mouth at bedtime.   No facility-administered encounter medications on file as of 08/31/2023.    Past Medical History:  Diagnosis Date   Arthritis    Complication of anesthesia    slow to wake up with nephrectomy    Depression    Headache    History of kidney stones    Pneumonia     Past Surgical History:  Procedure Laterality Date   CARPAL TUNNEL RELEASE Right 08/25/2022   HERNIA REPAIR     NEPHRECTOMY     TOTAL HIP ARTHROPLASTY Left 08/18/2021   Procedure: TOTAL HIP ARTHROPLASTY ANTERIOR APPROACH;  Surgeon: Durene Romans, MD;  Location: [facility];  Service: Orthopedics;  Laterality: Left;   TUBAL LIGATION     UMBILICAL HERNIA REPAIR      History reviewed. No pertinent family history.  Social History   Socioeconomic History   Marital status: Single    Spouse name: Not on file   Number of children: Not on file   Years of education: Not on file   Highest education level: GED or equivalent  Occupational History   Not on file  Tobacco Use   Smoking status: Every Day    Current packs/day: 0.25    Types: Cigarettes    Passive exposure: Never   Smokeless tobacco: Never  Vaping Use   Vaping status: Never Used  Substance and Sexual Activity   Alcohol use: Not Currently   Drug use: Yes    Types: Marijuana    Comment: daily   Sexual activity: Not on file  Other Topics Concern   Not on file  Social History Narrative   Right handed   Caffeine- 1 cup daily   single  Social Drivers of Corporate investment banker Strain: Low Risk  (08/04/2023)   Overall Financial Resource Strain (CARDIA)    Difficulty of Paying Living Expenses: Not hard at all  Food Insecurity: No Food Insecurity (08/04/2023)   Hunger Vital Sign    Worried About Running Out of Food in the Last Year: Never true    Ran Out of Food in the Last Year: Never true  Transportation Needs: No Transportation Needs (08/04/2023)   PRAPARE - Administrator, Civil Service (Medical): No    Lack of Transportation (Non-Medical): No  Physical Activity: Inactive (08/04/2023)   Exercise Vital Sign    Days of Exercise per Week: 0 days    Minutes of Exercise per Session: 0 min  Stress: No Stress Concern Present (08/04/2023)    [employer] of Occupational Health - Occupational Stress Questionnaire    Feeling of Stress : Only a little  Social Connections: Moderately Isolated (08/04/2023)   Social Connection and Isolation Panel [NHANES]    Frequency of Communication with Friends and Family: More than three times a week    Frequency of Social Gatherings with Friends and Family: Once a week    Attends Religious Services: More than 4 times per year    Active Member of [employer] or Organizations: No    Attends Banker Meetings: Never    Marital Status: Never married  Intimate Partner Violence: Not At Risk (08/04/2023)   Humiliation, Afraid, Rape, and Kick questionnaire    Fear of Current or Ex-Partner: No    Emotionally Abused: No    Physically Abused: No    Sexually Abused: No    ROS      Objective    BP 121/83 (BP Location: Right Arm, Patient Position: Sitting, Cuff Size: Normal)   Pulse 63   Temp 98 F (36.7 C) (Oral)   Resp 16   Ht 5\' 4"  (1.626 m)   Wt 157 lb 9.6 oz (71.5 kg)   SpO2 93%   BMI 27.05 kg/m   Physical Exam  {Labs (Optional):23779}    Assessment & Plan:   Perimenopausal  Smoking     No follow-ups on file.   Tommie Raymond, MD

## 2023-08-31 NOTE — Telephone Encounter (Signed)
Copied from CRM [id number]. Topic: Clinical - Medication Question >> Aug 31, 2023 12:32 PM Victorino Dike T wrote: Reason for CRM: DULoxetine (CYMBALTA) 30 MG capsule does doctor want this medication stopped and start PARoxetine (PAXIL) 20 MG tablet, please call to advise [phone]

## 2023-09-01 ENCOUNTER — Encounter: Payer: Self-pay | Admitting: Family Medicine

## 2023-09-05 ENCOUNTER — Ambulatory Visit (INDEPENDENT_AMBULATORY_CARE_PROVIDER_SITE_OTHER): Payer: Medicare HMO | Admitting: Neurology

## 2023-09-05 ENCOUNTER — Encounter: Payer: Self-pay | Admitting: Neurology

## 2023-09-05 VITALS — BP 125/86 | HR 62 | Ht 64.0 in | Wt 157.5 lb

## 2023-09-05 DIAGNOSIS — G43009 Migraine without aura, not intractable, without status migrainosus: Secondary | ICD-10-CM | POA: Diagnosis not present

## 2023-09-05 DIAGNOSIS — I63312 Cerebral infarction due to thrombosis of left middle cerebral artery: Secondary | ICD-10-CM | POA: Diagnosis not present

## 2023-09-05 MED ORDER — ROSUVASTATIN CALCIUM 40 MG PO TABS: 40.0000 mg | ORAL_TABLET | Freq: Every evening | ORAL | 3 refills | Status: AC

## 2023-09-05 NOTE — Patient Instructions (Signed)
 Continue current medications  Call us for updates  Return in 6 months or sooner if worse.

## 2023-09-05 NOTE — Progress Notes (Signed)
 [facility]  PATIENT: Kristin Montes DOB: 1976/04/29  REQUESTING CLINICIAN: Georganna Skeans, MD HISTORY FROM: Patient  REASON FOR VISIT: Slurred, found to have a new stroke    HISTORICAL  CHIEF COMPLAINT:  Chief Complaint  Patient presents with   Room 12    Pt is here Alone. Pt states that she has been doing pretty good. Pt states that her speech has been getting better,but she still notice the difference. Pt states that she is having a headache today.    Interval History 09/05/2023:  Kristin Montes presents today for follow-up, last visit was in November, since then she has been doing well.  She completed her Plavix course, currently on aspirin alone. She tells me that her headaches had improved a lot since the addition of propranolol.  She has not been using the Maxalt lately.  Overall she is doing well, no other complaint no other concerns. She still smokes cigarettes but has decreased the number, has not started exercising.     History of Present Illness   This is a 48 year old woman with past medical history of chronic neck and back pain status post-surgery, chronic migraines, bilateral carpal tunnel syndrome and previous stroke who is presenting after experiencing significant speech impairment that began while she was in [place] in early September. The patient reported that her speech became very slurred, to the point where she could hardly talk. She initially attributed this to the medications she had taken, including gabapentin and Diclofenac. However, the speech impairment persisted, and although it improved slightly over time, it did not fully resolve. The patient sought medical attention approximately five days after the onset of symptoms.   The patient presented to the ED 5 days later underwent a CT head and CTA head and neck which showed her previous stroke and stenosis of the right ICA. She was recommended MRI Brain but she left AMA. She had the Brain scan a month  later, which revealed a new stroke on the left side of her brain. This was in addition to multiple previous strokes that had been identified in the past. She reported that she had not experienced any symptoms of high blood pressure until after a hip replacement surgery, after which she was started on amlodipine. The patient's blood pressure has been well-controlled on this medication. Currently she reports that her speech is almost back to normal, and she has no other deficits from her stroke.    The patient also reported a history of carpal tunnel syndrome, for which she had undergone surgery on one hand earlier in the year. She was considering surgery for the other hand but had decided to delay this due to her recent health issues. The patient also reported frequent headaches, which she managed with hydrocodone as needed. She had tried other medications for her headaches, including Maxalt, but did not find them effective.   She does not drink alcohol but smokes cigarettes and tried to maintain an active lifestyle   OTHER MEDICAL CONDITIONS: History of stroke, hypertension, chronic headaches, anxiety, Carpal tunnel syndrome   REVIEW OF SYSTEMS: Full 14 system review of systems performed and negative with exception of: As noted in the HPI   ALLERGIES: Allergies  Allergen Reactions   Latex Itching    Rash.   Diclofenac Other (See Comments)    HOME MEDICATIONS: Outpatient Medications Prior to Visit  Medication Sig Dispense Refill   amLODipine-valsartan (EXFORGE) 5-160 MG tablet Take 1 tablet by mouth daily.     cetirizine (  ZYRTEC) 10 MG tablet Take 1 tablet (10 mg total) by mouth daily. 30 tablet 3   DULoxetine (CYMBALTA) 30 MG capsule Take 1 capsule by mouth twice daily 180 capsule 0   HYDROcodone-acetaminophen (NORCO) 7.5-325 MG tablet Take 1 tablet by mouth every 6 (six) hours.     PARoxetine (PAXIL) 20 MG tablet Take 1 tablet (20 mg total) by mouth daily. 30 tablet 2   propranolol ER  (INDERAL LA) 60 MG 24 hr capsule Take 1 capsule (60 mg total) by mouth daily. 30 capsule 11   rizatriptan (MAXALT-MLT) 10 MG disintegrating tablet Take 1 tablet (10 mg total) by mouth as needed for migraine. May repeat in 2 hours if needed 10 tablet 3   clopidogrel (PLAVIX) 75 MG tablet Take 1 tablet (75 mg total) by mouth daily. (Patient not taking: Reported on 09/05/2023) 90 tablet 0   cyclobenzaprine (FLEXERIL) 10 MG tablet cyclobenzaprine 10 mg tablet  Take 1 tablet 3 times a day by oral route. (Patient not taking: Reported on 04/04/2023) 90 tablet 1   diclofenac (VOLTAREN) 75 MG EC tablet Take 1 tablet twice a day by oral route with meal(s). (Patient not taking: Reported on 04/04/2023)     fluticasone (FLONASE) 50 MCG/ACT nasal spray Place 2 sprays into both nostrils daily. (Patient not taking: Reported on 09/05/2023) 18 mL 2   gabapentin (NEURONTIN) 300 MG capsule Take by mouth. (Patient not taking: Reported on 04/04/2023)     rosuvastatin (CRESTOR) 40 MG tablet Take 1 tablet (40 mg total) by mouth at bedtime. 30 tablet 11   No facility-administered medications prior to visit.    PAST MEDICAL HISTORY: Past Medical History:  Diagnosis Date   Arthritis    Complication of anesthesia    slow to wake up with nephrectomy   Depression    Headache    History of kidney stones    Pneumonia     PAST SURGICAL HISTORY: Past Surgical History:  Procedure Laterality Date   CARPAL TUNNEL RELEASE Right 08/25/2022   HERNIA REPAIR     NEPHRECTOMY     TOTAL HIP ARTHROPLASTY Left 08/18/2021   Procedure: TOTAL HIP ARTHROPLASTY ANTERIOR APPROACH;  Surgeon: Durene Romans, MD;  Location: [facility];  Service: Orthopedics;  Laterality: Left;   TUBAL LIGATION     UMBILICAL HERNIA REPAIR      FAMILY HISTORY: History reviewed. No pertinent family history.  SOCIAL HISTORY: Social History   Socioeconomic History   Marital status: Single    Spouse name: Not on file   Number of children: Not on file   Years  of education: Not on file   Highest education level: GED or equivalent  Occupational History   Not on file  Tobacco Use   Smoking status: Every Day    Current packs/day: 0.25    Types: Cigarettes    Passive exposure: Never   Smokeless tobacco: Never  Vaping Use   Vaping status: Never Used  Substance and Sexual Activity   Alcohol use: Not Currently   Drug use: Yes    Types: Marijuana    Comment: daily   Sexual activity: Not on file  Other Topics Concern   Not on file  Social History Narrative   Right handed   Caffeine- 1 cup daily   single   Social Drivers of Health   Financial Resource Strain: Low Risk  (08/04/2023)   Overall Financial Resource Strain (CARDIA)    Difficulty of Paying Living Expenses: Not hard at all  Food Insecurity: No Food Insecurity (08/04/2023)   Hunger Vital Sign    Worried About Running Out of Food in the Last Year: Never true    Ran Out of Food in the Last Year: Never true  Transportation Needs: No Transportation Needs (08/04/2023)   PRAPARE - Administrator, Civil Service (Medical): No    Lack of Transportation (Non-Medical): No  Physical Activity: Inactive (08/04/2023)   Exercise Vital Sign    Days of Exercise per Week: 0 days    Minutes of Exercise per Session: 0 min  Stress: No Stress Concern Present (08/04/2023)   [employer] of Occupational Health - Occupational Stress Questionnaire    Feeling of Stress : Only a little  Social Connections: Moderately Isolated (08/04/2023)   Social Connection and Isolation Panel [NHANES]    Frequency of Communication with Friends and Family: More than three times a week    Frequency of Social Gatherings with Friends and Family: Once a week    Attends Religious Services: More than 4 times per year    Active Member of [employer] or Organizations: No    Attends Banker Meetings: Never    Marital Status: Never married  Intimate Partner Violence: Not At Risk (08/04/2023)   Humiliation,  Afraid, Rape, and Kick questionnaire    Fear of Current or Ex-Partner: No    Emotionally Abused: No    Physically Abused: No    Sexually Abused: No    PHYSICAL EXAM  GENERAL EXAM/CONSTITUTIONAL: Vitals:  Vitals:   09/05/23 0919  BP: 125/86  Pulse: 62  Weight: 157 lb 8 oz (71.4 kg)  Height: 5\' 4"  (1.626 m)   Body mass index is 27.03 kg/m. Wt Readings from Last 3 Encounters:  09/05/23 157 lb 8 oz (71.4 kg)  08/31/23 157 lb 9.6 oz (71.5 kg)  05/24/23 154 lb (69.9 kg)   Patient is in no distress; well developed, nourished and groomed; neck is supple  MUSCULOSKELETAL: Gait, strength, tone, movements noted in Neurologic exam below  NEUROLOGIC: MENTAL STATUS:      No data to display         awake, alert, oriented to person, place and time recent and remote memory intact normal attention and concentration language fluent, comprehension intact, naming intact. No aphasia or dysarthria noted.  fund of knowledge appropriate  CRANIAL NERVE:  2nd, 3rd, 4th, 6th - Visual fields full to confrontation, extraocular muscles intact, no nystagmus 5th - facial sensation symmetric 7th - facial strength symmetric 8th - hearing intact 9th - palate elevates symmetrically, uvula midline 11th - shoulder shrug symmetric 12th - tongue protrusion midline  MOTOR:  normal bulk and tone, full strength in the BUE, BLE  SENSORY:  normal and symmetric to light touch  COORDINATION:  finger-nose-finger, fine finger movements normal  REFLEXES:  deep tendon reflexes present and symmetric  GAIT/STATION:  normal     DIAGNOSTIC DATA (LABS, IMAGING, TESTING) - I reviewed patient records, labs, notes, testing and imaging myself where available.  Lab Results  Component Value Date   WBC 6.5 03/25/2023   HGB 12.7 03/25/2023   HCT 38.6 03/25/2023   MCV 96.0 03/25/2023   PLT 205 03/25/2023      Component Value Date/Time   NA 137 03/25/2023 1353   K 3.8 03/25/2023 1353   CL 103  03/25/2023 1353   CO2 25 03/25/2023 1353   GLUCOSE 90 03/25/2023 1353   BUN 10 03/25/2023 1353   CREATININE 0.78 03/25/2023 1353  CALCIUM 8.7 (L) 03/25/2023 1353   PROT 7.0 08/14/2021 0946   ALBUMIN 3.8 08/14/2021 0946   AST 25 08/14/2021 0946   ALT 33 08/14/2021 0946   ALKPHOS 106 08/14/2021 0946   BILITOT 0.3 08/14/2021 0946   GFRNONAA >60 03/25/2023 1353   GFRAA >90 12/16/2013 1440   Lab Results  Component Value Date   CHOL 267 (H) 05/25/2023   HDL 56 05/25/2023   LDLCALC 175 (H) 05/25/2023   TRIG 192 (H) 05/25/2023   CHOLHDL 4.8 (H) 05/25/2023   Lab Results  Component Value Date   HGBA1C 6.2 (H) 05/25/2023   No results found for: "VITAMINB12" No results found for: "TSH"  CTA Head and Neck 03/25/2023 1. No acute intracranial abnormality. 2. 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 Small areas of encephalomalacia and gliosis are also seen anterior left basal ganglia region and left cerebellar hemisphere. 3. String opacification of the right ICA in the cervical and vertical petrous segment with occlusion seen from the horizontal petrous to the supraclinoid segment. Reconstitution of flow is seen via prominent right posterior communicating artery. This is likely a chronic finding given abnormal flow void seen on prior MRI of the brain performed October 2022. 4. 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 5. Mild atherosclerotic changes of the left carotid bifurcation without stenosis.  MRI Brain 04/24/2023 1. Small acute cortically-based infarct within the posterior left frontal lobe (with involvement of the precentral gyrus). This is new from the prior brain MRI of 04/25/2021 and the signal characteristics are most consistent with a late subacute infarct. 2. Chronic infarcts within the right parietal and occipital  lobes, left basal ganglia/anterior limb of left internal capsule and left cerebellar hemisphere, as outlined. Some of these infarcts are new from the prior MRI.   Assessment and Plan 48 y.o. year old female with past medical history of cervicalgia and migraine type headaches, and recent stroke who is presenting for follow-up.  Overall she is doing well, she is compliant with her medications.  Tells me that her migraine frequency has decreased since the addition of propranolol.  She has not used her Maxalt in a long time.  Plan for patient is to continue her current medications, and to contact us if her headaches or symptoms do get worse. We also discussed about smoking cessation and need for exercise. I will see her in 6 months for follow-up or sooner if worse.   1. Migraine without aura and without status migrainosus, not intractable   2. Cerebrovascular accident (CVA) due to thrombosis of left middle cerebral artery [facility])       Patient Instructions  Continue current medications  Call us for updates  Return in 6 months or sooner if worse.   No orders of the defined types were placed in this encounter.   Meds ordered this encounter  Medications   rosuvastatin (CRESTOR) 40 MG tablet    Sig: Take 1 tablet (40 mg total) by mouth at bedtime.    Dispense:  90 tablet    Refill:  3    Return in about 6 months (around 03/04/2024).   [name], MD 09/05/2023, 10:52 AM  [facility] [address], [address] [place], [place] [address] [phone]

## 2023-09-08 ENCOUNTER — Telehealth: Payer: Self-pay | Admitting: Family Medicine

## 2023-09-08 DIAGNOSIS — M79674 Pain in right toe(s): Secondary | ICD-10-CM | POA: Diagnosis not present

## 2023-09-08 DIAGNOSIS — M25871 Other specified joint disorders, right ankle and foot: Secondary | ICD-10-CM | POA: Diagnosis not present

## 2023-09-08 NOTE — Telephone Encounter (Signed)
 Kristin Montes

## 2023-09-22 ENCOUNTER — Ambulatory Visit
Admission: RE | Admit: 2023-09-22 | Discharge: 2023-09-22 | Disposition: A | Discharge status: Home / Self Care | Payer: Medicare HMO | Source: Ambulatory Visit | Attending: Family Medicine | Admitting: Family Medicine

## 2023-09-22 DIAGNOSIS — Z1231 Encounter for screening mammogram for malignant neoplasm of breast: Secondary | ICD-10-CM | POA: Diagnosis not present

## 2023-09-26 DIAGNOSIS — G8929 Other chronic pain: Secondary | ICD-10-CM | POA: Diagnosis not present

## 2023-09-26 DIAGNOSIS — M5416 Radiculopathy, lumbar region: Secondary | ICD-10-CM | POA: Diagnosis not present

## 2023-09-26 DIAGNOSIS — M542 Cervicalgia: Secondary | ICD-10-CM | POA: Diagnosis not present

## 2023-09-26 DIAGNOSIS — F1220 Cannabis dependence, uncomplicated: Secondary | ICD-10-CM | POA: Diagnosis not present

## 2023-09-26 DIAGNOSIS — F17201 Nicotine dependence, unspecified, in remission: Secondary | ICD-10-CM | POA: Diagnosis not present

## 2023-09-27 DIAGNOSIS — G5603 Carpal tunnel syndrome, bilateral upper limbs: Secondary | ICD-10-CM | POA: Diagnosis not present

## 2023-09-28 ENCOUNTER — Ambulatory Visit: Payer: Medicare Other | Admitting: Neurology

## 2023-10-03 DIAGNOSIS — G5603 Carpal tunnel syndrome, bilateral upper limbs: Secondary | ICD-10-CM | POA: Diagnosis not present

## 2023-11-10 DIAGNOSIS — M25871 Other specified joint disorders, right ankle and foot: Secondary | ICD-10-CM | POA: Diagnosis not present

## 2023-11-17 DIAGNOSIS — I10 Essential (primary) hypertension: Secondary | ICD-10-CM | POA: Diagnosis not present

## 2023-11-17 DIAGNOSIS — Z905 Acquired absence of kidney: Secondary | ICD-10-CM | POA: Diagnosis not present

## 2023-11-18 DIAGNOSIS — G5603 Carpal tunnel syndrome, bilateral upper limbs: Secondary | ICD-10-CM | POA: Diagnosis not present

## 2023-11-25 ENCOUNTER — Other Ambulatory Visit: Payer: Self-pay | Admitting: Family Medicine

## 2023-11-28 DIAGNOSIS — G5602 Carpal tunnel syndrome, left upper limb: Secondary | ICD-10-CM | POA: Diagnosis not present

## 2023-11-30 DIAGNOSIS — M542 Cervicalgia: Secondary | ICD-10-CM | POA: Diagnosis not present

## 2023-11-30 DIAGNOSIS — M5416 Radiculopathy, lumbar region: Secondary | ICD-10-CM | POA: Diagnosis not present

## 2023-11-30 DIAGNOSIS — Z79899 Other long term (current) drug therapy: Secondary | ICD-10-CM | POA: Diagnosis not present

## 2023-11-30 DIAGNOSIS — G894 Chronic pain syndrome: Secondary | ICD-10-CM | POA: Diagnosis not present

## 2023-12-08 DIAGNOSIS — M25512 Pain in left shoulder: Secondary | ICD-10-CM | POA: Diagnosis not present

## 2023-12-08 DIAGNOSIS — G8929 Other chronic pain: Secondary | ICD-10-CM | POA: Diagnosis not present

## 2023-12-13 DIAGNOSIS — G5602 Carpal tunnel syndrome, left upper limb: Secondary | ICD-10-CM | POA: Diagnosis not present

## 2023-12-13 DIAGNOSIS — M5416 Radiculopathy, lumbar region: Secondary | ICD-10-CM | POA: Diagnosis not present

## 2023-12-18 DIAGNOSIS — M51369 Other intervertebral disc degeneration, lumbar region without mention of lumbar back pain or lower extremity pain: Secondary | ICD-10-CM | POA: Diagnosis not present

## 2023-12-18 DIAGNOSIS — M5412 Radiculopathy, cervical region: Secondary | ICD-10-CM | POA: Diagnosis not present

## 2023-12-21 ENCOUNTER — Encounter: Payer: Self-pay | Admitting: Family

## 2023-12-21 ENCOUNTER — Ambulatory Visit (INDEPENDENT_AMBULATORY_CARE_PROVIDER_SITE_OTHER): Admitting: Family

## 2023-12-21 VITALS — BP 106/67 | HR 59 | Temp 98.3°F | Resp 16 | Ht 64.0 in | Wt 152.2 lb

## 2023-12-21 DIAGNOSIS — F419 Anxiety disorder, unspecified: Secondary | ICD-10-CM

## 2023-12-21 DIAGNOSIS — F32A Depression, unspecified: Secondary | ICD-10-CM

## 2023-12-21 DIAGNOSIS — E041 Nontoxic single thyroid nodule: Secondary | ICD-10-CM

## 2023-12-21 MED ORDER — PAROXETINE HCL 30 MG PO TABS: 30.0000 mg | ORAL_TABLET | Freq: Every day | ORAL | 0 refills | Status: DC

## 2023-12-21 NOTE — Progress Notes (Signed)
 Patient ID: Kristin Montes, female    DOB: 07-19-1975  MRN: [id number]  CC: Chronic Conditions Follow-Up  Subjective: Kristin Montes is a 48 y.o. female who presents for chronic conditions follow-up.    Her concerns today include:  - States she had an ultrasound of neck earlier this year and was told she has a thyroid  nodule and blood cyst. States she was seen by Neurology and was told that she does not need a stent for blood cyst. States Abraham Abo, MD told her that no further workup is needed. She denies red flag symptoms associated with right neck/thyroid  nodule. - Anxiety depression persisting. States she doesn't feel that Paroxetine  is helping as much as she would like. States Paroxetine  was also supposed to help with night sweats. She would like referral to a therapist. She denies thoughts of self-harm, suicidal ideations, homicidal ideations.  Patient Active Problem List   Diagnosis Date Noted   CVA (cerebral vascular accident) (HCC) 03/19/2023   Trochanteric bursitis of right hip 03/13/2023   Trochanteric bursitis of left hip 03/07/2023   Carpal tunnel syndrome of left wrist 02/24/2023   Chronic pain syndrome 01/24/2023   Lumbar radiculopathy 12/13/2022   Osteoarthritis of right hip 10/02/2022   Myofascial pain syndrome 05/27/2022   Peripheral nerve disease 04/06/2022   Lumbar spondylosis 03/23/2022   Chronic neck pain 03/22/2022   Spondylosis without myelopathy 03/18/2022   Neck pain 03/18/2022   Neuropathic pain 03/18/2022   Therapeutic opioid induced constipation 02/22/2022   Pain in right foot 02/01/2022   Peroneal tendinitis of right lower extremity 02/01/2022   Cervical radiculopathy 01/05/2022   Pain in joint of left knee 01/04/2022   Pain in joint of right hip 01/04/2022   S/P total left hip arthroplasty 08/18/2021   DDD (degenerative disc disease), cervical 07/28/2021   Osteoarthritis of hip 06/13/2021   Chronic left hip pain 05/19/2021   Foraminal stenosis  of cervical region 01/23/2021   Herniated cervical disc 09/28/2020   Arthritis 09/28/2020   S/p nephrectomy 03/16/2013     Current Outpatient Medications on File Prior to Visit  Medication Sig Dispense Refill   amLODipine-valsartan (EXFORGE) 5-160 MG tablet Take 1 tablet by mouth daily.     cetirizine  (ZYRTEC ) 10 MG tablet Take 1 tablet (10 mg total) by mouth daily. 30 tablet 3   HYDROcodone -acetaminophen  (NORCO) 7.5-325 MG tablet Take 1 tablet by mouth every 6 (six) hours.     propranolol  ER (INDERAL  LA) 60 MG 24 hr capsule Take 1 capsule (60 mg total) by mouth daily. 30 capsule 11   rizatriptan  (MAXALT -MLT) 10 MG disintegrating tablet Take 1 tablet (10 mg total) by mouth as needed for migraine. May repeat in 2 hours if needed 10 tablet 3   DULoxetine  (CYMBALTA ) 30 MG capsule Take 1 capsule by mouth twice daily (Patient not taking: Reported on 12/21/2023) 180 capsule 0   rosuvastatin  (CRESTOR ) 40 MG tablet Take 1 tablet (40 mg total) by mouth at bedtime. 90 tablet 3   No current facility-administered medications on file prior to visit.    Allergies  Allergen Reactions   Latex Itching    Rash.   Diclofenac Other (See Comments)    Social History   Socioeconomic History   Marital status: Single    Spouse name: Not on file   Number of children: Not on file   Years of education: Not on file   Highest education level: GED or equivalent  Occupational History   Not on file  Tobacco Use   Smoking status: Every Day    Current packs/day: 0.25    Types: Cigarettes    Passive exposure: Never   Smokeless tobacco: Never  Vaping Use   Vaping status: Never Used  Substance and Sexual Activity   Alcohol use: Not Currently   Drug use: Yes    Types: Marijuana    Comment: daily   Sexual activity: Not on file  Other Topics Concern   Not on file  Social History Narrative   Right handed   Caffeine- 1 cup daily   single   Social Drivers of Health   Financial Resource Strain: Low Risk   (08/04/2023)   Overall Financial Resource Strain (CARDIA)    Difficulty of Paying Living Expenses: Not hard at all  Food Insecurity: No Food Insecurity (08/04/2023)   Hunger Vital Sign    Worried About Running Out of Food in the Last Year: Never true    Ran Out of Food in the Last Year: Never true  Transportation Needs: No Transportation Needs (08/04/2023)   PRAPARE - Administrator, Civil Service (Medical): No    Lack of Transportation (Non-Medical): No  Physical Activity: Inactive (08/04/2023)   Exercise Vital Sign    Days of Exercise per Week: 0 days    Minutes of Exercise per Session: 0 min  Stress: No Stress Concern Present (08/04/2023)   [employer] of Occupational Health - Occupational Stress Questionnaire    Feeling of Stress : Only a little  Social Connections: Moderately Isolated (08/04/2023)   Social Connection and Isolation Panel [NHANES]    Frequency of Communication with Friends and Family: More than three times a week    Frequency of Social Gatherings with Friends and Family: Once a week    Attends Religious Services: More than 4 times per year    Active Member of [employer] or Organizations: No    Attends Banker Meetings: Never    Marital Status: Never married  Intimate Partner Violence: Not At Risk (08/04/2023)   Humiliation, Afraid, Rape, and Kick questionnaire    Fear of Current or Ex-Partner: No    Emotionally Abused: No    Physically Abused: No    Sexually Abused: No    History reviewed. No pertinent family history.  Past Surgical History:  Procedure Laterality Date   CARPAL TUNNEL RELEASE Right 08/25/2022   HERNIA REPAIR     NEPHRECTOMY     TOTAL HIP ARTHROPLASTY Left 08/18/2021   Procedure: TOTAL HIP ARTHROPLASTY ANTERIOR APPROACH;  Surgeon: Claiborne Crew, MD;  Location: [facility];  Service: Orthopedics;  Laterality: Left;   TUBAL LIGATION     UMBILICAL HERNIA REPAIR      ROS: Review of Systems Negative except as stated  above  PHYSICAL EXAM: BP 106/67   Pulse (!) 59   Temp 98.3 F (36.8 C) (Oral)   Resp 16   Ht 5' 4 (1.626 m)   Wt 152 lb 3.2 oz (69 kg)   SpO2 94%   BMI 26.13 kg/m   Physical Exam HENT:     Head: Normocephalic and atraumatic.     Nose: Nose normal.     Mouth/Throat:     Mouth: Mucous membranes are moist.     Pharynx: Oropharynx is clear.  Eyes:     Extraocular Movements: Extraocular movements intact.     Conjunctiva/sclera: Conjunctivae normal.     Pupils: Pupils are equal, round, and reactive to light.  Neck:  Comments: Firm movable nodule right neck. Non-tender to palpation. Cardiovascular:     Rate and Rhythm: Bradycardia present.     Pulses: Normal pulses.     Heart sounds: Normal heart sounds.  Pulmonary:     Effort: Pulmonary effort is normal.     Breath sounds: Normal breath sounds.  Musculoskeletal:        General: Normal range of motion.     Cervical back: Normal range of motion and neck supple.  Neurological:     General: No focal deficit present.     Mental Status: She is alert and oriented to person, place, and time.  Psychiatric:        Mood and Affect: Mood normal.        Behavior: Behavior normal.     ASSESSMENT AND PLAN: 1. Thyroid  nodule (Primary) - Referral to Endocrinology for evaluation/management. - Ambulatory referral to Endocrinology  2. Anxiety and depression - Patient denies thoughts of self-harm, suicidal ideations, homicidal ideations. - Increase Paroxetine  from 20 mg to 30 mg as prescribed.  - Referral to Psychiatry for evaluation/management. - Follow-up with primary provider in 4 weeks or sooner id needed.  - PARoxetine  (PAXIL ) 30 MG tablet; Take 1 tablet (30 mg total) by mouth daily.  Dispense: 90 tablet; Refill: 0 - Ambulatory referral to Psychiatry   Patient was given the opportunity to ask questions.  Patient verbalized understanding of the plan and was able to repeat key elements of the plan. Patient was given clear  instructions to go to Emergency Department or return to medical center if symptoms don't improve, worsen, or new problems develop.The patient verbalized understanding.   Orders Placed This Encounter  Procedures   Ambulatory referral to Psychiatry   Ambulatory referral to Endocrinology     Requested Prescriptions   Signed Prescriptions Disp Refills   PARoxetine  (PAXIL ) 30 MG tablet 90 tablet 0    Sig: Take 1 tablet (30 mg total) by mouth daily.    Return for Follow-Up or next available with Abraham Abo, MD.  Senaida Dama, NP

## 2023-12-21 NOTE — Progress Notes (Signed)
 Know on back of her neck. Its been there for about year.  It is getting bigger.  Patient scored a 15 on the PHQ-9

## 2023-12-22 NOTE — Telephone Encounter (Unsigned)
 Copied from CRM [id number]. Topic: Clinical - Request for Lab/Test Order >> Dec 22, 2023  8:05 AM Lysbeth Sauger wrote: Reason for CRM: [facility] needs lab sent over.

## 2023-12-23 ENCOUNTER — Telehealth: Payer: Self-pay

## 2023-12-23 DIAGNOSIS — M542 Cervicalgia: Secondary | ICD-10-CM | POA: Diagnosis not present

## 2023-12-23 DIAGNOSIS — M5412 Radiculopathy, cervical region: Secondary | ICD-10-CM | POA: Diagnosis not present

## 2023-12-23 NOTE — Telephone Encounter (Signed)
 Copied from CRM [id number]. Topic: Medical Record Request - Other >> Dec 23, 2023  8:13 AM Carlatta H wrote: Reason for CRM: Please fax patients last labs to [facility] # [phone]

## 2023-12-26 NOTE — Telephone Encounter (Signed)
 Called patient and informed her that her last lab were done by the neurologist and we are unable to print it out. Patient verbalized understanding and no other question or concerns

## 2023-12-27 ENCOUNTER — Other Ambulatory Visit: Payer: Self-pay | Admitting: Nurse Practitioner

## 2023-12-27 ENCOUNTER — Encounter: Payer: Self-pay | Admitting: Nurse Practitioner

## 2023-12-27 DIAGNOSIS — E041 Nontoxic single thyroid nodule: Secondary | ICD-10-CM | POA: Diagnosis not present

## 2023-12-29 ENCOUNTER — Ambulatory Visit
Admission: RE | Admit: 2023-12-29 | Discharge: 2023-12-29 | Disposition: A | Discharge status: Home / Self Care | Source: Ambulatory Visit | Attending: Nurse Practitioner | Admitting: Nurse Practitioner

## 2023-12-29 DIAGNOSIS — R221 Localized swelling, mass and lump, neck: Secondary | ICD-10-CM | POA: Diagnosis not present

## 2023-12-29 DIAGNOSIS — E041 Nontoxic single thyroid nodule: Secondary | ICD-10-CM

## 2023-12-31 DIAGNOSIS — M25512 Pain in left shoulder: Secondary | ICD-10-CM | POA: Diagnosis not present

## 2024-01-04 DIAGNOSIS — M5412 Radiculopathy, cervical region: Secondary | ICD-10-CM | POA: Diagnosis not present

## 2024-01-04 DIAGNOSIS — M542 Cervicalgia: Secondary | ICD-10-CM | POA: Diagnosis not present

## 2024-01-05 DIAGNOSIS — M25512 Pain in left shoulder: Secondary | ICD-10-CM | POA: Diagnosis not present

## 2024-01-23 ENCOUNTER — Encounter: Payer: Self-pay | Admitting: Neurology

## 2024-01-23 ENCOUNTER — Ambulatory Visit: Payer: Medicare Other | Admitting: Neurology

## 2024-01-23 VITALS — BP 133/89 | HR 66 | Ht 64.0 in | Wt 152.1 lb

## 2024-01-23 DIAGNOSIS — G5603 Carpal tunnel syndrome, bilateral upper limbs: Secondary | ICD-10-CM | POA: Diagnosis not present

## 2024-01-23 DIAGNOSIS — G43009 Migraine without aura, not intractable, without status migrainosus: Secondary | ICD-10-CM | POA: Diagnosis not present

## 2024-01-23 DIAGNOSIS — I63312 Cerebral infarction due to thrombosis of left middle cerebral artery: Secondary | ICD-10-CM | POA: Diagnosis not present

## 2024-01-23 DIAGNOSIS — G459 Transient cerebral ischemic attack, unspecified: Secondary | ICD-10-CM

## 2024-01-23 DIAGNOSIS — R799 Abnormal finding of blood chemistry, unspecified: Secondary | ICD-10-CM

## 2024-01-23 MED ORDER — CLOPIDOGREL BISULFATE 75 MG PO TABS
75.0000 mg | ORAL_TABLET | Freq: Every day | ORAL | 3 refills | Status: AC
Start: 2024-01-23 — End: ?

## 2024-01-23 NOTE — Patient Instructions (Addendum)
 Discontinue Aspirin  Start Plavix  75 mg daily Will check lipid panel today Continue your other medications including Crestor  and propranolol  Consider wrist braces for the bilateral carpal tunnel syndrome Please call 911 if you do have another focal neurological deficit Return in 1 year or sooner if worse.

## 2024-01-23 NOTE — Progress Notes (Signed)
 [facility]  PATIENT: Kristin Montes DOB: 1975-07-27  REQUESTING CLINICIAN: Tanda Bleacher, MD HISTORY FROM: Patient  REASON FOR VISIT: Slurred, found to have a new stroke    HISTORICAL  CHIEF COMPLAINT:  Chief Complaint  Patient presents with   Cerebrovascular Accident    Rm12, alone, Cva follow up: pt stated that 2 weeks ago she had an episode where the right side of body wasn't working properly almost like it was paralyzed    Migraine    Rm12, alone, migraine: 0/30 days, triggers: heat, pork   Carpal Tunnel    Rm12, alone, Cts: pt stated that she recently had surgery on left and its doing much better and right hand was done feb of last year. Pt stated that the right hand is still giving nerve pain   Neck Pain    Rm12, alone, Cervicalgia: pt stated that her neck pain is daily with varying intensity     Headache    Rm12, alone, ha's: 3/30 days, triggers: pork, heat   INTERVAL HISTORY 01/23/2024 Patient presents today for follow-up, last visit was in February since then she continues to do well, in terms of her headaches, she is compliant with her propranolol , denies any migraine but reports 3 days of headache in the past month.  She has not been using her Maxalt . She did complete carpal tunnel surgery for the left hand, tells me that her symptoms are stable. For her history of strokes, she tells me 2 weeks ago she has an episode of right arm and right leg weakness lasting about 30 minutes.  It was difficult for her to walk, she could not stand on her right side.  She did not call EMS but took 1 extra aspirin .  She has not had any additional events since then.   Interval History 09/05/2023:  Kristin Montes presents today for follow-up, last visit was in November, since then she has been doing well.  She completed her Plavix  course, currently on aspirin  alone. She tells me that her headaches had improved a lot since the addition of propranolol .  She has not been using the  Maxalt  lately.  Overall she is doing well, no other complaint no other concerns. She still smokes cigarettes but has decreased the number, has not started exercising.    History of Present Illness   This is a 48 year old woman with past medical history of chronic neck and back pain status post-surgery, chronic migraines, bilateral carpal tunnel syndrome and previous stroke who is presenting after experiencing significant speech impairment that began while she was in [place]  in early September. The patient reported that her speech became very slurred, to the point where she could hardly talk. She initially attributed this to the medications she had taken, including gabapentin and Diclofenac. However, the speech impairment persisted, and although it improved slightly over time, it did not fully resolve. The patient sought medical attention approximately five days after the onset of symptoms.   The patient presented to the ED 5 days later underwent a CT head and CTA head and neck which showed her previous stroke and stenosis of the right ICA. She was recommended MRI Brain but she left AMA. She had the Brain scan a month later, which revealed a new stroke on the left side of her brain. This was in addition to multiple previous strokes that had been identified in the past. She reported that she had not experienced any symptoms of high blood pressure until after a hip replacement  surgery, after which she was started on amlodipine. The patient's blood pressure has been well-controlled on this medication. Currently she reports that her speech is almost back to normal, and she has no other deficits from her stroke.    The patient also reported a history of carpal tunnel syndrome, for which she had undergone surgery on one hand earlier in the year. She was considering surgery for the other hand but had decided to delay this due to her recent health issues. The patient also reported frequent headaches, which she  managed with hydrocodone  as needed. She had tried other medications for her headaches, including Maxalt , but did not find them effective.   She does not drink alcohol but smokes cigarettes and tried to maintain an active lifestyle   OTHER MEDICAL CONDITIONS: History of stroke, hypertension, chronic headaches, anxiety, Carpal tunnel syndrome   REVIEW OF SYSTEMS: Full 14 system review of systems performed and negative with exception of: As noted in the HPI   ALLERGIES: Allergies  Allergen Reactions   Latex Itching    Rash.  latex   Diclofenac Other (See Comments)    HOME MEDICATIONS: Outpatient Medications Prior to Visit  Medication Sig Dispense Refill   amLODipine-valsartan (EXFORGE) 5-160 MG tablet Take 1 tablet by mouth daily.     cetirizine  (ZYRTEC ) 10 MG tablet Take 1 tablet (10 mg total) by mouth daily. 30 tablet 3   HYDROcodone -acetaminophen  (NORCO) 7.5-325 MG tablet Take 1 tablet by mouth every 6 (six) hours.     PARoxetine  (PAXIL ) 30 MG tablet Take 1 tablet (30 mg total) by mouth daily. 90 tablet 0   propranolol  ER (INDERAL  LA) 60 MG 24 hr capsule Take 1 capsule (60 mg total) by mouth daily. 30 capsule 11   rizatriptan  (MAXALT -MLT) 10 MG disintegrating tablet Take 1 tablet (10 mg total) by mouth as needed for migraine. May repeat in 2 hours if needed 10 tablet 3   rosuvastatin  (CRESTOR ) 40 MG tablet Take 1 tablet (40 mg total) by mouth at bedtime. 90 tablet 3   DULoxetine  (CYMBALTA ) 30 MG capsule Take 1 capsule by mouth twice daily (Patient not taking: Reported on 12/21/2023) 180 capsule 0   No facility-administered medications prior to visit.    PAST MEDICAL HISTORY: Past Medical History:  Diagnosis Date   Arthritis    Complication of anesthesia    slow to wake up with nephrectomy   Depression    Headache    History of kidney stones    Pneumonia     PAST SURGICAL HISTORY: Past Surgical History:  Procedure Laterality Date   CARPAL TUNNEL RELEASE Right  08/25/2022   HERNIA REPAIR     NEPHRECTOMY     TOTAL HIP ARTHROPLASTY Left 08/18/2021   Procedure: TOTAL HIP ARTHROPLASTY ANTERIOR APPROACH;  Surgeon: Ernie Cough, MD;  Location: [facility];  Service: Orthopedics;  Laterality: Left;   TUBAL LIGATION     UMBILICAL HERNIA REPAIR      FAMILY HISTORY: History reviewed. No pertinent family history.  SOCIAL HISTORY: Social History   Socioeconomic History   Marital status: Single    Spouse name: Not on file   Number of children: Not on file   Years of education: Not on file   Highest education level: GED or equivalent  Occupational History   Not on file  Tobacco Use   Smoking status: Every Day    Current packs/day: 0.25    Types: Cigarettes    Passive exposure: Never   Smokeless tobacco:  Never  Vaping Use   Vaping status: Never Used  Substance and Sexual Activity   Alcohol use: Not Currently   Drug use: Yes    Types: Marijuana    Comment: daily   Sexual activity: Not on file  Other Topics Concern   Not on file  Social History Narrative   Right handed   Caffeine- 1 cup daily   single   Social Drivers of Health   Financial Resource Strain: Low Risk  (08/04/2023)   Overall Financial Resource Strain (CARDIA)    Difficulty of Paying Living Expenses: Not hard at all  Food Insecurity: No Food Insecurity (08/04/2023)   Hunger Vital Sign    Worried About Running Out of Food in the Last Year: Never true    Ran Out of Food in the Last Year: Never true  Transportation Needs: No Transportation Needs (08/04/2023)   PRAPARE - Administrator, Civil Service (Medical): No    Lack of Transportation (Non-Medical): No  Physical Activity: Inactive (08/04/2023)   Exercise Vital Sign    Days of Exercise per Week: 0 days    Minutes of Exercise per Session: 0 min  Stress: No Stress Concern Present (08/04/2023)   [employer] of Occupational Health - Occupational Stress Questionnaire    Feeling of Stress : Only a little  Social  Connections: Moderately Isolated (08/04/2023)   Social Connection and Isolation Panel    Frequency of Communication with Friends and Family: More than three times a week    Frequency of Social Gatherings with Friends and Family: Once a week    Attends Religious Services: More than 4 times per year    Active Member of [employer] or Organizations: No    Attends Banker Meetings: Never    Marital Status: Never married  Intimate Partner Violence: Not At Risk (08/04/2023)   Humiliation, Afraid, Rape, and Kick questionnaire    Fear of Current or Ex-Partner: No    Emotionally Abused: No    Physically Abused: No    Sexually Abused: No    PHYSICAL EXAM  GENERAL EXAM/CONSTITUTIONAL: Vitals:  Vitals:   01/23/24 0915  BP: 133/89  Pulse: 66  SpO2: 97%  Weight: 152 lb 1.9 oz (69 kg)  Height: 5' 4 (1.626 m)    Body mass index is 26.11 kg/m. Wt Readings from Last 3 Encounters:  01/23/24 152 lb 1.9 oz (69 kg)  12/21/23 152 lb 3.2 oz (69 kg)  09/05/23 157 lb 8 oz (71.4 kg)   Patient is in no distress; well developed, nourished and groomed; neck is supple  MUSCULOSKELETAL: Gait, strength, tone, movements noted in Neurologic exam below  NEUROLOGIC: MENTAL STATUS:      No data to display         awake, alert, oriented to person, place and time recent and remote memory intact normal attention and concentration language fluent, comprehension intact, naming intact. No aphasia or dysarthria noted.  fund of knowledge appropriate  CRANIAL NERVE:  2nd, 3rd, 4th, 6th - Visual fields full to confrontation, extraocular muscles intact, no nystagmus 5th - facial sensation symmetric 7th - facial strength symmetric 8th - hearing intact 9th - palate elevates symmetrically, uvula midline 11th - shoulder shrug symmetric 12th - tongue protrusion midline  MOTOR:  normal bulk and tone, full strength in the BUE, BLE  SENSORY:  normal and symmetric to light touch  COORDINATION:   finger-nose-finger, fine finger movements normal  GAIT/STATION:  normal  DIAGNOSTIC DATA (LABS, IMAGING, TESTING) - I reviewed patient records, labs, notes, testing and imaging myself where available.  Lab Results  Component Value Date   WBC 6.5 03/25/2023   HGB 12.7 03/25/2023   HCT 38.6 03/25/2023   MCV 96.0 03/25/2023   PLT 205 03/25/2023      Component Value Date/Time   NA 137 03/25/2023 1353   K 3.8 03/25/2023 1353   CL 103 03/25/2023 1353   CO2 25 03/25/2023 1353   GLUCOSE 90 03/25/2023 1353   BUN 10 03/25/2023 1353   CREATININE 0.78 03/25/2023 1353   CALCIUM  8.7 (L) 03/25/2023 1353   PROT 7.0 08/14/2021 0946   ALBUMIN 3.8 08/14/2021 0946   AST 25 08/14/2021 0946   ALT 33 08/14/2021 0946   ALKPHOS 106 08/14/2021 0946   BILITOT 0.3 08/14/2021 0946   GFRNONAA >60 03/25/2023 1353   GFRAA >90 12/16/2013 1440   Lab Results  Component Value Date   CHOL 267 (H) 05/25/2023   HDL 56 05/25/2023   LDLCALC 175 (H) 05/25/2023   TRIG 192 (H) 05/25/2023   CHOLHDL 4.8 (H) 05/25/2023   Lab Results  Component Value Date   HGBA1C 6.2 (H) 05/25/2023   No results found for: VITAMINB12 No results found for: TSH  CTA Head and Neck 03/25/2023 1. No acute intracranial abnormality. 2. 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 Small areas of encephalomalacia and gliosis are also seen anterior left basal ganglia region and left cerebellar hemisphere. 3. String opacification of the right ICA in the cervical and vertical petrous segment with occlusion seen from the horizontal petrous to the supraclinoid segment. Reconstitution of flow is seen via prominent right posterior communicating artery. This is likely a chronic finding given abnormal flow void seen on prior MRI of the brain performed October 2022. 4. Asymmetry of the anterior circulation with small caliber of the right MCA and right ACA vascular tree compared to the contralateral, related to  right internal carotid artery occlusion. 5. Mild atherosclerotic changes of the left carotid bifurcation without stenosis.  MRI Brain 04/24/2023 1. Small acute cortically-based infarct within the posterior left frontal lobe (with involvement of the precentral gyrus). This is new from the prior brain MRI of 04/25/2021 and the signal characteristics are most consistent with a late subacute infarct. 2. 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 Some of these infarcts are new from the prior MRI.   Assessment and Plan 48 y.o. year old female with past medical history of cervicalgia and migraine type headaches, and recent strokes who is presenting for follow-up.  Overall she is doing well, she is compliant with her medications.  Tells me that her migraine frequency has decreased since the addition of propranolol .  She has not used her Maxalt  in a long time.  Plan for patient is to continue her current medications, and to contact us  if her headaches or symptoms do get worse.  We also discussed about smoking cessation and need for exercise.  In regard of her strokes and the episode that happened 2 weeks ago, I have informed patient that I am afraid that she had a TIA or a small stroke.  She is currently on aspirin  and Crestor , will switch aspirin  to Plavix .  Advised her to continue with Crestor  40 mg daily and I will check her lipid panel today.  Advised her to call EMS if she has any  new symptoms concerning for strokes.  She was understanding.    1. Cerebrovascular accident (CVA) due to thrombosis of left middle cerebral artery (HCC)   2. TIA (transient ischemic attack)   3. Migraine without aura and without status migrainosus, not intractable   4. Abnormal finding of blood chemistry, unspecified   5. Bilateral carpal tunnel syndrome      Patient Instructions  Discontinue  Aspirin  Start Plavix  75 mg daily Will check lipid panel today Continue your other medications including Crestor  and propranolol  Consider wrist braces for the bilateral carpal tunnel syndrome Please call 911 if you do have another focal neurological deficit Return in 1 year or sooner if worse.   Orders Placed This Encounter  Procedures   Lipid panel    Meds ordered this encounter  Medications   clopidogrel  (PLAVIX ) 75 MG tablet    Sig: Take 1 tablet (75 mg total) by mouth daily.    Dispense:  90 tablet    Refill:  3    Return in about 1 year (around 01/22/2025).   Pastor Falling, MD 01/23/2024, 9:46 AM  [facility] [address], [address] [place], [place] [address] [phone]

## 2024-01-24 ENCOUNTER — Ambulatory Visit: Payer: Self-pay | Admitting: Neurology

## 2024-01-24 LAB — LIPID PANEL
Chol/HDL Ratio: 3.2 ratio (ref 0.0–4.4)
Cholesterol, Total: 116 mg/dL (ref 100–199)
HDL: 36 mg/dL — ABNORMAL LOW (ref >39)
LDL Chol Calc (NIH): 59 mg/dL (ref 0–99)
Triglycerides: 115 mg/dL (ref 0–149)
VLDL Cholesterol Cal: 21 mg/dL (ref 5–40)

## 2024-02-25 ENCOUNTER — Other Ambulatory Visit: Payer: Self-pay | Admitting: Family Medicine

## 2024-03-01 DIAGNOSIS — Z79899 Other long term (current) drug therapy: Secondary | ICD-10-CM | POA: Diagnosis not present

## 2024-03-01 DIAGNOSIS — M5414 Radiculopathy, thoracic region: Secondary | ICD-10-CM | POA: Diagnosis not present

## 2024-03-01 DIAGNOSIS — M5416 Radiculopathy, lumbar region: Secondary | ICD-10-CM | POA: Diagnosis not present

## 2024-03-01 DIAGNOSIS — R296 Repeated falls: Secondary | ICD-10-CM | POA: Diagnosis not present

## 2024-03-15 ENCOUNTER — Other Ambulatory Visit: Payer: Self-pay | Admitting: Family

## 2024-03-15 ENCOUNTER — Telehealth: Payer: Self-pay

## 2024-03-15 DIAGNOSIS — F32A Depression, unspecified: Secondary | ICD-10-CM

## 2024-03-15 NOTE — Telephone Encounter (Signed)
 Surgical clearance faxed to [facility] for injection

## 2024-03-20 DIAGNOSIS — G5602 Carpal tunnel syndrome, left upper limb: Secondary | ICD-10-CM | POA: Diagnosis not present

## 2024-03-27 DIAGNOSIS — M5414 Radiculopathy, thoracic region: Secondary | ICD-10-CM | POA: Diagnosis not present

## 2024-04-09 ENCOUNTER — Other Ambulatory Visit: Payer: Self-pay

## 2024-04-09 MED ORDER — PROPRANOLOL HCL ER 60 MG PO CP24: 60.0000 mg | ORAL_CAPSULE | Freq: Every day | ORAL | 11 refills | Status: AC

## 2024-04-11 ENCOUNTER — Telehealth: Payer: Self-pay

## 2024-04-11 NOTE — Telephone Encounter (Signed)
 Called pt and gave her info to [facility] [place] ph # [phone]  Copied from CRM [id number]. Topic: Referral - Question >> Apr 10, 2024  1:01 PM Rea ORN wrote: Reason for CRM: PT was referred to psych at her 6/11 appt. She received a call back then but didn't make the appt. She is now ready to make appt but she can not remember who she was referred to. Please call back to advise, I was unable to locate referral in pt chart.

## 2024-05-21 ENCOUNTER — Ambulatory Visit (HOSPITAL_COMMUNITY): Payer: Self-pay | Admitting: Student in an Organized Health Care Education/Training Program

## 2024-05-21 NOTE — Progress Notes (Deleted)
 Psychiatric Initial Adult Assessment  Patient Identification: Kristin Montes MRN:  [id number] Date of Evaluation:  05/21/2024 Referral Source: Jaycee Greig PARAS, NP   Assessment:  Kristin Montes is a 48 y.o. female with a history of *** who presents to [facility] for initial evaluation of anxiety and depression per reffering provider.  Patient reports ***  The patient's presentation is most consistent with a principal diagnosis of ***, as evidenced by ***.   Relevant Chart Review Notes/Encounters: *** Labs/Imaging: ***   Risk Assessment: A suicide and violence risk assessment was performed as part of this evaluation. There patient is deemed to be at chronic elevated risk for self-harm/suicide given the following factors: {SABSUICIDERISKFACTORS:29780}. These risk factors are mitigated by the following factors: {SABSUICIDEPROTECTIVEFACTORS:29779}. The patient is deemed to be at chronic elevated risk for violence given the following factors: {SABVIOLENCERISKFACTORS:29781}. These risk factors are mitigated by the following factors: {SABVIOLENCEPROTECTIVEFACTORS:29782}. There is no *** acute risk for suicide or violence at this time. The patient was educated about relevant modifiable risk factors including following recommendations for treatment of psychiatric illness and abstaining from substance abuse.  While future psychiatric events cannot be accurately predicted, the patient does not *** currently require  acute inpatient psychiatric care and does not *** currently meet [place]  involuntary commitment criteria.    Plan:  # *** Past medication trials:  Status of problem: new to me Interventions: -- ***Plavix  75 mg daily  # *** Past medication trials:  Status of problem: new to me Interventions: -- ***  # *** Past medication trials:  Status of problem: new to me Interventions: -- ***  Health Maintenance PCP: Tanda Bleacher, MD  HTN-amlodipine-valsartan,  propranolol  ***-Plavix  75 mg daily HLD-Crestor  40 mg daily ***-Maxalt    Patient was given contact information for behavioral health clinic and was instructed to call 911 for emergencies.  Patient and plan of care will be discussed with the Attending MD, who agrees with the above statement and plan.   Subjective:  Chief Complaint: No chief complaint on file.   History of Present Illness:   *** The patient is here for ***. The patient reports a *** history of ***.  The patient's stressors include ***.   Sleep: *** Snoring: *** Caffeine: ***   Psychiatric ROS Depression: *** The patient denied any current symptoms of depression/***The patient reports a *** history of low mood and anhedonia, characterized by {Depression:32915}. Suicidal thoughts are ***. The patient denies any symptoms of ***.   Anxiety: *** The patient denied any current symptoms of anxiety/ ***The patient reports a *** history of excessive anxiety and worry about a number of events, characterized by {HJI:67083}.   Panic attacks: *** The patient denies any current or past history of panic attacks/*** The patient reports experiencing of intense surge of fear, in which the following symptoms develop: {Panic Attacks:32917}  Mania/Hypomania: *** Negative for any past or current symptoms of expansive energy or mood/ The patient reports a *** history of episodes of expansive energy and mood that can last up to *** days characterized by {MANIA:32922}    Last episode reported by patient: ***   PTSD: *** Negative for any history of trauma / ***The patient reports past exposure to ***.  Intrusions s/xs: ***  Hyperarousal s/xs: ***  Avoidance s/xs: ***  Negative effects on cognition and mood: ***  Eating Disorder: The patient denies any current or past history of disordered eating that includes restrictive, binging, purging behaviors, or any other compensatory behaviors / ***  IED: ***/ The patient reports a ** history  of impulsive aggression characterized by {PZI:67078}  Psychosis: *** The patient denies any history of hallucinations, disorganized thinking, paranoia, or delusions. / ***    Safety: Active SI: ***Denied Passive SI: *** Access to firearms: *** Psychosis: as above  ROS   Past Psychiatric History:  Diagnoses: *** Medication trials: *** Previous psychiatrist/therapist: *** Hospitalizations: *** Suicide attempts: *** NSSIB Hx: *** Hx of violence towards others: *** Hx of trauma/abuse: ***  Substance Abuse History: Alcohol:  Hx of withdrawal seizures/Dts: *** Tobacco: *** Cannabis: *** Other Illicit Substance Use: IVDU: denied Detox Hx: *** Rehab Hx: ***  Past Medical History: PCP: Tanda Bleacher, MD  Dx: *** ALL: *** Latex and Diclofenac  Head trauma: *** Seizures: ***   Family Psychiatric History: *** Psychiatric disorders: *** Suicide hx: *** Homicide: ***  Social History: Living situation: *** Occupational status: *** Educational history: *** Marital Status: *** Children: *** Legal Hx: *** DUI/DWI: *** Military Hx: *** Developmental Hx: School Performance: Upbringing/Relationship with parents: Major Family stressors:  Past Medical History:  Past Medical History:  Diagnosis Date   Arthritis    Complication of anesthesia    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  Social History:   Social History   Socioeconomic History   Marital status: Single    Spouse name: Not on file    Number of children: Not on file   Years of education: Not on file   Highest education level: GED or equivalent  Occupational History   Not on file  Tobacco Use   Smoking status: Every Day    Current packs/day: 0.25    Types: Cigarettes    Passive exposure: Never   Smokeless tobacco: Never  Vaping Use   Vaping status: Never Used  Substance and Sexual Activity   Alcohol use: Not Currently   Drug use: Yes    Types: Marijuana    Comment: daily   Sexual activity: Not on file  Other Topics Concern   Not on file  Social History Narrative   Right handed   Caffeine- 1 cup daily   single   Social Drivers of Health   Financial Resource Strain: Low Risk  (08/04/2023)   Overall Financial Resource Strain (CARDIA)    Difficulty of Paying Living Expenses: Not hard at all  Food Insecurity: No Food Insecurity (08/04/2023)   Hunger Vital Sign    Worried About Running Out of Food in the Last Year: Never true    Ran Out of Food in the Last Year: Never true  Transportation Needs: No Transportation Needs (08/04/2023)   PRAPARE - Administrator, Civil Service (Medical): No    Lack of Transportation (Non-Medical): No  Physical Activity: Inactive (08/04/2023)   Exercise Vital Sign    Days of Exercise per Week: 0 days    Minutes of Exercise per Session: 0 min  Stress:  No Stress Concern Present (08/04/2023)   [employer] of Occupational Health - Occupational Stress Questionnaire    Feeling of Stress : Only a little  Social Connections: Moderately Isolated (08/04/2023)   Social Connection and Isolation Panel    Frequency of Communication with Friends and Family: More than three times a week    Frequency of Social Gatherings with Friends and Family: Once a week    Attends Religious Services: More than 4 times per year    Active Member of [employer] or Organizations: No    Attends Banker Meetings: Never    Marital Status: Never married    Allergies:   Allergies   Allergen Reactions   Latex Itching    Rash.  latex   Diclofenac Other (See Comments)    Current Medications: Current Outpatient Medications  Medication Sig Dispense Refill   amLODipine-valsartan (EXFORGE) 5-160 MG tablet Take 1 tablet by mouth daily.     cetirizine  (ZYRTEC ) 10 MG tablet Take 1 tablet (10 mg total) by mouth daily. 30 tablet 3   clopidogrel  (PLAVIX ) 75 MG tablet Take 1 tablet (75 mg total) by mouth daily. 90 tablet 3   HYDROcodone -acetaminophen  (NORCO) 7.5-325 MG tablet Take 1 tablet by mouth every 6 (six) hours.     PARoxetine  (PAXIL ) 30 MG tablet Take 1 tablet by mouth once daily 90 tablet 0   propranolol  ER (INDERAL  LA) 60 MG 24 hr capsule Take 1 capsule (60 mg total) by mouth daily. 30 capsule 11   rizatriptan  (MAXALT -MLT) 10 MG disintegrating tablet Take 1 tablet (10 mg total) by mouth as needed for migraine. May repeat in 2 hours if needed 10 tablet 3   rosuvastatin  (CRESTOR ) 40 MG tablet Take 1 tablet (40 mg total) by mouth at bedtime. 90 tablet 3   No current facility-administered medications for this visit.     Objective: Psychiatric Specialty Exam: General Appearance: Casual, fairly groomed  Eye Contact:  Good    Speech:  Clear, coherent, normal rate, spontaneous  Volume:  Normal   Mood:  see above  Affect:  Appropriate, congruent, full range  Thought Content: Logical, rumination  ***  Suicidal Thoughts: see subjective  Thought Process:  Coherent, goal-directed, circumstantial ***  Orientation:  A&Ox4   Memory:  Immediate good  Judgment:  Fair   Insight:  Fair***  Concentration:  Attention and concentration good   Recall:  Good  Fund of Knowledge: Good  Language: Good, fluent  Psychomotor Activity: Normal  Akathisia:  NA   AIMS (if indicated): NA   Assets:   {Assets (PAA):22698}  ADL's:  Intact  Cognition: WNL  Sleep: see above  Appetite: see above    Physical Exam    Metabolic Disorder Labs: Lab Results  Component Value Date    HGBA1C 6.2 (H) 05/25/2023   No results found for: PROLACTIN Lab Results  Component Value Date   CHOL 116 01/23/2024   TRIG 115 01/23/2024   HDL 36 (L) 01/23/2024   CHOLHDL 3.2 01/23/2024   LDLCALC 59 01/23/2024   LDLCALC 175 (H) 05/25/2023   No results found for: TSH  Therapeutic Level Labs: No results found for: LITHIUM No results found for: CBMZ No results found for: VALPROATE   Marlo Masson, MD 11/10/20257:27 AM

## 2024-05-28 DIAGNOSIS — N62 Hypertrophy of breast: Secondary | ICD-10-CM | POA: Diagnosis not present

## 2024-05-28 DIAGNOSIS — M542 Cervicalgia: Secondary | ICD-10-CM | POA: Diagnosis not present

## 2024-05-28 DIAGNOSIS — M5416 Radiculopathy, lumbar region: Secondary | ICD-10-CM | POA: Diagnosis not present

## 2024-05-30 ENCOUNTER — Ambulatory Visit (INDEPENDENT_AMBULATORY_CARE_PROVIDER_SITE_OTHER): Admitting: Family Medicine

## 2024-05-30 ENCOUNTER — Encounter: Payer: Self-pay | Admitting: Family Medicine

## 2024-05-30 VITALS — BP 104/67 | HR 66 | Ht 64.0 in | Wt 146.8 lb

## 2024-05-30 DIAGNOSIS — Z136 Encounter for screening for cardiovascular disorders: Secondary | ICD-10-CM

## 2024-05-30 DIAGNOSIS — Z13228 Encounter for screening for other metabolic disorders: Secondary | ICD-10-CM

## 2024-05-30 DIAGNOSIS — Z114 Encounter for screening for human immunodeficiency virus [HIV]: Secondary | ICD-10-CM | POA: Diagnosis not present

## 2024-05-30 DIAGNOSIS — Z130 Encounter for screening for diseases of the blood and blood-forming organs and certain disorders involving the immune mechanism: Secondary | ICD-10-CM

## 2024-05-30 DIAGNOSIS — Z1159 Encounter for screening for other viral diseases: Secondary | ICD-10-CM | POA: Diagnosis not present

## 2024-05-30 DIAGNOSIS — Z1211 Encounter for screening for malignant neoplasm of colon: Secondary | ICD-10-CM | POA: Diagnosis not present

## 2024-05-30 DIAGNOSIS — Z0000 Encounter for general adult medical examination without abnormal findings: Secondary | ICD-10-CM | POA: Diagnosis not present

## 2024-05-30 DIAGNOSIS — Z1329 Encounter for screening for other suspected endocrine disorder: Secondary | ICD-10-CM

## 2024-05-31 ENCOUNTER — Encounter: Payer: Self-pay | Admitting: Family Medicine

## 2024-05-31 DIAGNOSIS — G43909 Migraine, unspecified, not intractable, without status migrainosus: Secondary | ICD-10-CM | POA: Diagnosis not present

## 2024-05-31 DIAGNOSIS — E785 Hyperlipidemia, unspecified: Secondary | ICD-10-CM | POA: Diagnosis not present

## 2024-05-31 DIAGNOSIS — Z823 Family history of stroke: Secondary | ICD-10-CM | POA: Diagnosis not present

## 2024-05-31 DIAGNOSIS — Z96642 Presence of left artificial hip joint: Secondary | ICD-10-CM | POA: Diagnosis not present

## 2024-05-31 DIAGNOSIS — Z59861 Financial insecurity, difficulty paying for utilities: Secondary | ICD-10-CM | POA: Diagnosis not present

## 2024-05-31 DIAGNOSIS — F320 Major depressive disorder, single episode, mild: Secondary | ICD-10-CM | POA: Diagnosis not present

## 2024-05-31 DIAGNOSIS — I10 Essential (primary) hypertension: Secondary | ICD-10-CM | POA: Diagnosis not present

## 2024-05-31 DIAGNOSIS — I69328 Other speech and language deficits following cerebral infarction: Secondary | ICD-10-CM | POA: Diagnosis not present

## 2024-05-31 DIAGNOSIS — Z809 Family history of malignant neoplasm, unspecified: Secondary | ICD-10-CM | POA: Diagnosis not present

## 2024-05-31 DIAGNOSIS — Z5941 Food insecurity: Secondary | ICD-10-CM | POA: Diagnosis not present

## 2024-05-31 DIAGNOSIS — Z833 Family history of diabetes mellitus: Secondary | ICD-10-CM | POA: Diagnosis not present

## 2024-05-31 DIAGNOSIS — E049 Nontoxic goiter, unspecified: Secondary | ICD-10-CM | POA: Diagnosis not present

## 2024-05-31 DIAGNOSIS — F419 Anxiety disorder, unspecified: Secondary | ICD-10-CM | POA: Diagnosis not present

## 2024-05-31 DIAGNOSIS — Z8249 Family history of ischemic heart disease and other diseases of the circulatory system: Secondary | ICD-10-CM | POA: Diagnosis not present

## 2024-05-31 NOTE — Progress Notes (Signed)
 Established Patient Office Visit  Subjective    Patient ID: Kristin Montes, female    DOB: Feb 21, 1976  Age: 48 y.o. MRN: [id number]  CC:  Chief Complaint  Patient presents with   Annual Exam    HPI Kristin Montes presents for routine annual exam. Patient denies acute complaints.   Outpatient Encounter Medications as of 05/30/2024  Medication Sig   amLODipine-valsartan (EXFORGE) 5-160 MG tablet Take 1 tablet by mouth daily.   clopidogrel  (PLAVIX ) 75 MG tablet Take 1 tablet (75 mg total) by mouth daily.   HYDROcodone -acetaminophen  (NORCO) 7.5-325 MG tablet Take 1 tablet by mouth every 6 (six) hours.   methocarbamol  (ROBAXIN ) 500 MG tablet Take 500 mg by mouth daily as needed for muscle spasms (pain).   PARoxetine  (PAXIL ) 30 MG tablet Take 1 tablet by mouth once daily   propranolol  ER (INDERAL  LA) 60 MG 24 hr capsule Take 1 capsule (60 mg total) by mouth daily.   rosuvastatin  (CRESTOR ) 40 MG tablet Take 1 tablet (40 mg total) by mouth at bedtime.   cetirizine  (ZYRTEC ) 10 MG tablet Take 1 tablet (10 mg total) by mouth daily. (Patient not taking: Reported on 05/30/2024)   rizatriptan  (MAXALT -MLT) 10 MG disintegrating tablet Take 1 tablet (10 mg total) by mouth as needed for migraine. May repeat in 2 hours if needed (Patient not taking: Reported on 05/30/2024)   No facility-administered encounter medications on file as of 05/30/2024.    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 No pertinent family history.  Social History   Socioeconomic History   Marital status: Single    Spouse name: Not on file   Number of children: Not on file   Years of education: Not on file   Highest education level: GED or equivalent  Occupational History   Not on file  Tobacco Use   Smoking status: Every Day    Current packs/day: 0.25    Types: Cigarettes    Passive exposure: Never   Smokeless tobacco: Never  Vaping Use   Vaping status: Never Used  Substance and Sexual Activity   Alcohol use: Not Currently   Drug use: Yes    Types: Marijuana    Comment: daily   Sexual activity: Not on file  Other Topics Concern   Not on file  Social History Narrative   Right handed   Caffeine- 1 cup daily   single   Social Drivers of Health   Financial Resource Strain: Low Risk  (08/04/2023)   Overall Financial Resource Strain (CARDIA)    Difficulty of Paying Living Expenses: Not hard at all  Food Insecurity: No Food Insecurity (08/04/2023)   Hunger Vital Sign    Worried About Running Out of Food in the Last Year: Never true    Ran Out of Food in the Last Year: Never true  Transportation  Needs: No Transportation Needs (08/04/2023)   PRAPARE - Administrator, Civil Service (Medical): No    Lack of Transportation (Non-Medical): No  Physical Activity: Inactive (08/04/2023)   Exercise Vital Sign    Days of Exercise per Week: 0 days    Minutes of Exercise per Session: 0 min  Stress: No Stress Concern Present (08/04/2023)   [employer] of Occupational Health - Occupational Stress Questionnaire    Feeling of Stress : Only a little  Social Connections: Moderately Isolated (08/04/2023)   Social Connection and Isolation Panel    Frequency of Communication with Friends and Family: More than three times a week    Frequency of Social Gatherings with Friends and Family: Once a week    Attends Religious Services: More than 4 times per year    Active  Member of [employer] or Organizations: No    Attends Banker Meetings: Never    Marital Status: Never married  Intimate Partner Violence: Not At Risk (08/04/2023)   Humiliation, Afraid, Rape, and Kick questionnaire    Fear of Current or Ex-Partner: No    Emotionally Abused: No    Physically Abused: No    Sexually Abused: No    Review of Systems  All other systems reviewed and are negative.       Objective    BP 104/67   Pulse 66   Ht 5' 4 (1.626 m)   Wt 146 lb 12.8 oz (66.6 kg)   SpO2 94%   BMI 25.20 kg/m   Physical Exam Vitals and nursing note reviewed.  Constitutional:      General: She is not in acute distress. HENT:     Head: Normocephalic and atraumatic.     Right Ear: Tympanic membrane, ear canal and external ear normal.     Left Ear: Tympanic membrane, ear canal and external ear normal.     Nose: Nose normal.     Mouth/Throat:     Mouth: Mucous membranes are moist.     Pharynx: Oropharynx is clear.  Eyes:     Conjunctiva/sclera: Conjunctivae normal.     Pupils: Pupils are equal, round, and reactive to light.  Neck:     Thyroid : No thyromegaly.  Cardiovascular:     Rate and Rhythm: Normal rate and regular rhythm.     Heart sounds: Normal heart sounds. No murmur heard. Pulmonary:     Effort: Pulmonary effort is normal. No respiratory distress.     Breath sounds: Normal breath sounds.  Abdominal:     General: There is no distension.     Palpations: Abdomen is soft. There is no mass.     Tenderness: There is no abdominal tenderness.  Musculoskeletal:        General: Normal range of motion.     Cervical back: Normal range of motion and neck supple.  Skin:    General: Skin is warm and dry.  Neurological:     General: No focal deficit present.     Mental Status: She is alert and oriented to person, place, and time.  Psychiatric:        Mood and Affect: Mood normal.        Behavior: Behavior normal.         Assessment & Plan:   Annual  physical exam -     Comprehensive metabolic panel with GFR  Screening for deficiency anemia -     CBC with Differential/Platelet  Encounter for screening for cardiovascular  disorders -     Lipid panel  Screening for endocrine/metabolic/immunity disorders -     Hemoglobin A1c  Screening for colon cancer -     Cologuard  Screening for HIV (human immunodeficiency virus) -     HIV Antibody (routine testing w rflx)  Need for hepatitis C screening test -     Hepatitis C antibody     No follow-ups on file.   Tanda Raguel SQUIBB, MD

## 2024-06-04 LAB — HEMOGLOBIN A1C

## 2024-06-04 LAB — COMPREHENSIVE METABOLIC PANEL WITH GFR
ALT: 21 IU/L (ref 0–32)
AST: 21 IU/L (ref 0–40)
Albumin: 4.2 g/dL (ref 3.9–4.9)
Alkaline Phosphatase: 118 IU/L — ABNORMAL HIGH (ref 41–116)
BUN/Creatinine Ratio: 13 (ref 9–23)
BUN: 12 mg/dL (ref 6–24)
Bilirubin Total: <0.2 mg/dL (ref 0.0–1.2)
CO2: 25 mmol/L (ref 20–29)
Calcium: 9.2 mg/dL (ref 8.7–10.2)
Chloride: 104 mmol/L (ref 96–106)
Creatinine, Ser: 0.91 mg/dL (ref 0.57–1.00)
Globulin, Total: 2.4 g/dL (ref 1.5–4.5)
Glucose: 66 mg/dL — ABNORMAL LOW (ref 70–99)
Potassium: 4.2 mmol/L (ref 3.5–5.2)
Sodium: 142 mmol/L (ref 134–144)
Total Protein: 6.6 g/dL (ref 6.0–8.5)
eGFR: 78 mL/min/1.73 (ref >59)

## 2024-06-04 LAB — CBC WITH DIFFERENTIAL/PLATELET

## 2024-06-04 LAB — LIPID PANEL
Chol/HDL Ratio: 3.3 ratio (ref 0.0–4.4)
Cholesterol, Total: 132 mg/dL (ref 100–199)
HDL: 40 mg/dL (ref >39)
LDL Chol Calc (NIH): 65 mg/dL (ref 0–99)
Triglycerides: 158 mg/dL — ABNORMAL HIGH (ref 0–149)
VLDL Cholesterol Cal: 27 mg/dL (ref 5–40)

## 2024-06-04 LAB — HEPATITIS C ANTIBODY: Hep C Virus Ab: NONREACTIVE

## 2024-06-04 LAB — HIV ANTIBODY (ROUTINE TESTING W REFLEX): HIV Screen 4th Generation wRfx: NONREACTIVE

## 2024-06-11 ENCOUNTER — Other Ambulatory Visit: Payer: Self-pay | Admitting: Family Medicine

## 2024-06-11 DIAGNOSIS — F32A Depression, unspecified: Secondary | ICD-10-CM

## 2024-06-18 NOTE — Telephone Encounter (Signed)
Faxed to [facility]

## 2024-06-26 ENCOUNTER — Ambulatory Visit: Payer: Self-pay | Admitting: Family Medicine

## 2024-06-27 ENCOUNTER — Ambulatory Visit (HOSPITAL_COMMUNITY): Admitting: Student in an Organized Health Care Education/Training Program

## 2024-06-27 ENCOUNTER — Encounter (HOSPITAL_COMMUNITY): Payer: Self-pay | Admitting: Student in an Organized Health Care Education/Training Program

## 2024-06-27 VITALS — BP 116/79 | HR 66 | Ht 64.0 in | Wt 146.8 lb

## 2024-06-27 DIAGNOSIS — F1411 Cocaine abuse, in remission: Secondary | ICD-10-CM

## 2024-06-27 DIAGNOSIS — F411 Generalized anxiety disorder: Secondary | ICD-10-CM

## 2024-06-27 DIAGNOSIS — F1011 Alcohol abuse, in remission: Secondary | ICD-10-CM

## 2024-06-27 DIAGNOSIS — F1290 Cannabis use, unspecified, uncomplicated: Secondary | ICD-10-CM

## 2024-06-27 DIAGNOSIS — F17200 Nicotine dependence, unspecified, uncomplicated: Secondary | ICD-10-CM

## 2024-06-27 DIAGNOSIS — F331 Major depressive disorder, recurrent, moderate: Secondary | ICD-10-CM

## 2024-06-27 MED ORDER — FLUOXETINE HCL 10 MG PO CAPS: 10.0000 mg | ORAL_CAPSULE | Freq: Every day | ORAL | 0 refills | Status: DC

## 2024-06-27 MED ORDER — PAROXETINE HCL 10 MG PO TABS: 20.0000 mg | ORAL_TABLET | Freq: Every day | ORAL | 0 refills | Status: AC
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
## 2024-06-27 NOTE — Addendum Note (Signed)
 Addended by: CARRION CARRERO, MARLO on: 06/27/2024 11:56 AM   Modules accepted: Orders

## 2024-06-27 NOTE — Patient Instructions (Addendum)
 [facility] Patient Name: Kristin Montes Date of Visit: [06/27/2024  Provider Name: Dr. Homer   Dear Darus Public, it was a pleasure seeing you today, and we appreciate the opportunity to care for you.   These are the changes we have made to your medications:   We are going to work on switching your antidepressant medication to better address your symptoms of depression and anxiety.  Please follow the schedule below to carefully decrease the dose of your paroxetine .  Week 1 and week 2 Take paroxetine  20 mg daily in the morning Take fluoxetine  10 mg daily in the morning  Week 3 and week 4 Take paroxetine  15 mg daily in the morning Take fluoxetine  20 mg daily in the morning  Week 5 and week 6 Take paroxetine  10 mg daily in the morning Continue taking fluoxetine  20 mg daily in the morning  Week 7 and week 8 Take paroxetine  5 mg daily in the morning (stop the medication after these 2 weeks are done) Continue taking fluoxetine  20 mg daily until follow-up   Lifestyle Recommendations Sleep Hygiene: Aim for 7-9 hours of quality sleep each night. Establish a regular sleep routine and create a restful environment. Stress Management: Practice stress-reducing techniques such as mindfulness, meditation, deep breathing exercises, or hobbies that you enjoy. Smoking Cessation: If you smoke, consider quitting. We can provide resources and support to help you. Alcohol Consumption: Limit alcohol intake to moderate levels--up to one drink per day for women and up to two drinks per day for men. Routine Health Screenings: Stay up-to-date with routine health screenings and vaccinations as recommended.   Outpatient Therapy and Psychiatry Resources for Patients: Your psychiatric needs would be well-served by consultation and regular meetings with an outpatient therapist to assist you with your mood-related conditions. Here are a series of links for finding a  therapist.    Includes links to the following: [facility] (http://wilson-mayo.com/) (only for [facility] and please reserve for uninsured) [facility] [place] (http://blankenship-martinez.net/) Psychology Today Special Educational Needs Teacher (https://www.psychologytoday.com/us lendell) Psychology Today Support Group Tax Inspector (https://www.psychologytoday.com/us /groups/) [employer] - [employer] Location (https://carolinabehavioralcare.com/staff-location/[place]/) [facility] - Support Group Finder - (recorddebt.fi) [facility] [employer] - Lexicographer (https://fspcares.org/contact/) [employer] for Mental Health [place] - NAMI (https://namiguilford.org/support-and-education/support-groups/) [employer] - Affiliated with [facility] (https://www.[place].com/lb/locations/profile/[facility]-at-[address]/) Dept of Health and Human Services - Find a mental health facility (http://lester.info/)

## 2024-06-29 ENCOUNTER — Other Ambulatory Visit: Payer: Self-pay | Admitting: Medical Genetics

## 2024-07-31 ENCOUNTER — Encounter: Payer: Self-pay | Admitting: Plastic Surgery

## 2024-07-31 ENCOUNTER — Ambulatory Visit: Admitting: Plastic Surgery

## 2024-07-31 VITALS — BP 101/67 | HR 62 | Ht 64.0 in | Wt 144.4 lb

## 2024-07-31 DIAGNOSIS — N62 Hypertrophy of breast: Secondary | ICD-10-CM | POA: Diagnosis not present

## 2024-07-31 DIAGNOSIS — M546 Pain in thoracic spine: Secondary | ICD-10-CM | POA: Diagnosis not present

## 2024-07-31 DIAGNOSIS — Z6825 Body mass index (BMI) 25.0-25.9, adult: Secondary | ICD-10-CM | POA: Diagnosis not present

## 2024-07-31 DIAGNOSIS — M545 Low back pain, unspecified: Secondary | ICD-10-CM | POA: Diagnosis not present

## 2024-07-31 DIAGNOSIS — Z720 Tobacco use: Secondary | ICD-10-CM | POA: Diagnosis not present

## 2024-07-31 DIAGNOSIS — M542 Cervicalgia: Secondary | ICD-10-CM | POA: Diagnosis not present

## 2024-07-31 DIAGNOSIS — G8929 Other chronic pain: Secondary | ICD-10-CM

## 2024-07-31 NOTE — Progress Notes (Signed)
 "    Patient ID: Kristin Montes, female    DOB: February 28, 1976, 49 y.o.   MRN: [id number]   Chief Complaint  Patient presents with   consult   Breast Problem    Mammary Hyperplasia: The patient is a 49 y.o. female with a history of mammary hyperplasia for several years.  She has extremely large breasts causing symptoms that include the following: Back pain in the upper and lower back, including neck pain. She pulls or pins her bra straps to provide better lift and relief of the pressure and pain. She notices relief by holding her breast up manually.  Her shoulder straps cause grooves and pain and pressure that requires padding for relief. Pain medication is sometimes required with motrin  and tylenol .  Activities that are hindered by enlarged breasts include: exercise and running.  She has tried supportive clothing as well as fitted bras without improvement.  Her breasts are extremely large and fairly symmetric.  She has hyperpigmentation of the inframammary area on both sides.  The sternal to nipple distance on the right is 30 cm and the left is 30 cm.  The IMF distance is 10 cm.  She is 5 feet 4 inches tall and weighs 146 pounds.  The BMI = 25.1 kg/m.  Preoperative bra size = G cup.  The estimated excess breast tissue to be removed at the time of surgery = 400-450 grams on the left and 400-450 grams on the right.  Mammogram history: 12/2023 negative. Tobacco use:  smoker and willing to quit.   The patient expresses the desire to pursue surgical intervention.  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     Review of Systems  Constitutional: Negative.   HENT: Negative.    Eyes: Negative.   Respiratory: Negative.    Cardiovascular: Negative.   Gastrointestinal: Negative.   Endocrine: Negative.   Genitourinary: Negative.   Musculoskeletal:  Positive for back pain and neck pain.  Skin:  Positive for rash.    Past Medical History:  Diagnosis Date    Arthritis    Complication of anesthesia    slow to wake up with nephrectomy   Depression    Headache    History of kidney stones    Pneumonia     Past Surgical History:  Procedure Laterality Date   CARPAL TUNNEL RELEASE Right 08/25/2022   HERNIA REPAIR     NEPHRECTOMY     TOTAL HIP ARTHROPLASTY Left 08/18/2021   Procedure: TOTAL HIP ARTHROPLASTY ANTERIOR APPROACH;  Surgeon: Ernie Cough, MD;  Location: [facility];  Service: Orthopedics;  Laterality: Left;   TUBAL LIGATION     UMBILICAL HERNIA REPAIR       Current Medications[1]   Objective:   There were no vitals filed for this visit.  Physical Exam Vitals reviewed.  Constitutional:      Appearance: Normal appearance.  HENT:     Head: Atraumatic.  Cardiovascular:     Rate and Rhythm: Normal rate.     Pulses: Normal pulses.  Pulmonary:     Effort: Pulmonary effort is normal.  Abdominal:     Palpations: Abdomen is soft.  Skin:    General: Skin is warm.     Capillary Refill: Capillary refill takes less than 2 seconds.  Neurological:     Mental Status: She is alert and oriented to person, place, and time.  Psychiatric:        Mood  and Affect: Mood normal.        Behavior: Behavior normal.        Thought Content: Thought content normal.        Judgment: Judgment normal.     Assessment & Plan:  Neck pain  Chronic left hip pain  Chronic neck pain  Symptomatic mammary hypertrophy  The procedure the patient selected and that was best for the patient was discussed. The risk were discussed and include but not limited to the following:  Breast asymmetry, fluid accumulation, firmness of the breast, inability to breast feed, loss of nipple or areola, skin loss, change in skin and nipple sensation, fat necrosis of the breast tissue, bleeding, infection and healing delay.  There are risks of anesthesia and injury to nerves or blood vessels.  Allergic reaction to tape, suture and skin glue are possible.  There will be  swelling.  Any of these can lead to the need for revisional surgery which is not included in this surgery.  A breast reduction has potential to interfere with diagnostic procedures in the future.  This procedure is best done when the breast is fully developed.  Changes in the breast will continue to occur over time: pregnancy, weight gain or weigh loss. No guarantees are given for a certain bra or breast size.    Total time: 40 minutes. This includes time spent with the patient during the visit as well as time spent before and after the visit reviewing the chart, documenting the encounter, ordering pertinent studies and literature for the patient.   Physical therapy: Not required Mammogram: Up-to-date .   The patient is a good candidate for bilateral breast reduction with liposuction.  She is aware she needs to be 3 months of tobacco free.  She is also going to check with her neurologist Dr. Von to see if she can hold her Plavix  before surgery or if she needs to be bridged with Lovenox.  Will plan to see the patient back in a couple of months.  Pictures were obtained of the patient and placed in the chart with the patient's or guardian's permission.   Estefana RAMAN Caedon Bond, DO    [1]  Current Outpatient Medications:    amLODipine-valsartan (EXFORGE) 5-160 MG tablet, Take 1 tablet by mouth daily., Disp: , Rfl:    cetirizine  (ZYRTEC ) 10 MG tablet, Take 1 tablet (10 mg total) by mouth daily. (Patient taking differently: Take 10 mg by mouth once as needed for allergies.), Disp: 30 tablet, Rfl: 3   clopidogrel  (PLAVIX ) 75 MG tablet, Take 1 tablet (75 mg total) by mouth daily., Disp: 90 tablet, Rfl: 3   FLUoxetine  (PROZAC ) 10 MG capsule, Take 1 capsule (10 mg total) by mouth daily., Disp: 90 capsule, Rfl: 0   HYDROcodone -acetaminophen  (NORCO) 7.5-325 MG tablet, Take 1 tablet by mouth every 6 (six) hours., Disp: , Rfl:    methocarbamol  (ROBAXIN ) 500 MG tablet, Take 500 mg by mouth daily as needed for  muscle spasms (pain)., Disp: , Rfl:    PARoxetine  (PAXIL ) 10 MG tablet, Take 2 tablets (20 mg total) by mouth daily., Disp: 120 tablet, Rfl: 0   PARoxetine  (PAXIL ) 30 MG tablet, Take 1 tablet by mouth once daily, Disp: 90 tablet, Rfl: 0   propranolol  ER (INDERAL  LA) 60 MG 24 hr capsule, Take 1 capsule (60 mg total) by mouth daily., Disp: 30 capsule, Rfl: 11   rosuvastatin  (CRESTOR ) 40 MG tablet, Take 1 tablet (40 mg total) by mouth at bedtime., Disp:  90 tablet, Rfl: 3  "

## 2024-08-03 ENCOUNTER — Telehealth: Payer: Self-pay

## 2024-08-03 LAB — COLOGUARD: COLOGUARD: NEGATIVE

## 2024-08-03 NOTE — Telephone Encounter (Signed)
 Faxed surgical clearance to Dr. Von for directions of Plavix  and/or will she need to bridge with Levonox.  Received fax:   This message was sent via FAXCOM, a product from [employer]. [email or web address]                    -------Fax Transmission Report-------  To:               Recipient at [phone] Subject:          SECURED Result:           The transmission was successful. Explanation:      All Pages Ok Pages Sent:       3 Connect Time:     1 minutes, 5 seconds Transmit Time:    08/03/2024 15:03 Transfer Rate:    14400 Status Code:      0000 Retry Count:      0 Job Id:           [id number] Unique Id:        [id number] Fax Line:         36 Fax Server:       [id number]

## 2024-08-09 ENCOUNTER — Telehealth: Payer: Self-pay

## 2024-08-09 ENCOUNTER — Ambulatory Visit: Payer: Medicare HMO

## 2024-08-09 VITALS — Ht 64.0 in | Wt 144.0 lb

## 2024-08-09 DIAGNOSIS — Z0000 Encounter for general adult medical examination without abnormal findings: Secondary | ICD-10-CM

## 2024-08-09 DIAGNOSIS — F411 Generalized anxiety disorder: Secondary | ICD-10-CM

## 2024-08-09 DIAGNOSIS — F331 Major depressive disorder, recurrent, moderate: Secondary | ICD-10-CM

## 2024-08-09 NOTE — Patient Instructions (Addendum)
 Kristin Montes,  Thank you for taking the time for your Medicare Wellness Visit. I appreciate your continued commitment to your health goals. Please review the care plan we discussed, and feel free to reach out if I can assist you further.  Please note that Annual Wellness Visits do not include a physical exam. Some assessments may be limited, especially if the visit was conducted virtually. If needed, we may recommend an in-person follow-up with your provider.  Ongoing Care Seeing your primary care provider every 3 to 6 months helps us  monitor your health and provide consistent, personalized care.   Referrals If a referral was made during today's visit and you haven't received any updates within two weeks, please contact the referred provider directly to check on the status.  Recommended Screenings:  Health Maintenance  Topic Date Due   Hepatitis B Vaccine (1 of 3 - 19+ 3-dose series) Never done   Medicare Annual Wellness Visit  08/09/2025   Breast Cancer Screening  09/21/2025   Pap with HPV screening  12/15/2025   HPV Vaccine (No Doses Required) Completed   Hepatitis C Screening  Completed   HIV Screening  Completed   Meningitis B Vaccine  Aged Out   DTaP/Tdap/Td vaccine  Discontinued   Pneumococcal Vaccine  Discontinued   Flu Shot  Discontinued   Colon Cancer Screening  Discontinued   COVID-19 Vaccine  Discontinued       08/09/2024   10:25 AM  Advanced Directives  Does Patient Have a Medical Advance Directive? No  Would patient like information on creating a medical advance directive? No - Patient declined    Vision: Annual vision screenings are recommended for early detection of glaucoma, cataracts, and diabetic retinopathy. These exams can also reveal signs of chronic conditions such as diabetes and high blood pressure.  Dental: Annual dental screenings help detect early signs of oral cancer, gum disease, and other conditions linked to overall health, including heart disease  and diabetes.

## 2024-08-09 NOTE — Telephone Encounter (Cosign Needed)
 Can the nurse call regarding restarting Paxil . Psychiatrist started me on Prozac  but not helping.

## 2024-08-09 NOTE — Progress Notes (Signed)
 "  Chief Complaint  Patient presents with   Medicare Wellness     Subjective:   Kristin Montes is a 49 y.o. female who presents for a Medicare Annual Wellness Visit.  Visit info / Clinical Intake: Medicare Wellness Visit Type:: Subsequent Annual Wellness Visit Persons participating in visit and providing information:: patient Medicare Wellness Visit Mode:: Telephone If telephone:: video declined Since this visit was completed virtually, some vitals may be partially provided or unavailable. Missing vitals are due to the limitations of the virtual format.: Documented vitals are patient reported If Telephone or Video please confirm:: I connected with patient using audio/video enable telemedicine. I verified patient identity with two identifiers, discussed telehealth limitations, and patient agreed to proceed. Patient Location:: Home Provider Location:: Office Interpreter Needed?: No Pre-visit prep was completed: yes AWV questionnaire completed by patient prior to visit?: no Living arrangements:: (!) lives alone Patient's Overall Health Status Rating: good Typical amount of pain: none Does pain affect daily life?: no Are you currently prescribed opioids?: (!) yes  Dietary Habits and Nutritional Risks How many meals a day?: 2 Eats fruit and vegetables daily?: yes Most meals are obtained by: preparing own meals; eating out In the last 2 weeks, have you had any of the following?: none Diabetic:: no  Functional Status Activities of Daily Living (to include ambulation/medication): Independent Ambulation: Independent with device- listed below Home Assistive Devices/Equipment: [place]; Eyeglasses Medication Administration: Independent Home Management (perform basic housework or laundry): Independent Manage your own finances?: yes Primary transportation is: driving Concerns about vision?: no *vision screening is required for WTM* Concerns about hearing?: no  Fall Screening Falls in the  past year?: 0 Number of falls in past year: 0 Was there an injury with Fall?: 0 Fall Risk Category Calculator: 0 Patient Fall Risk Level: Low Fall Risk  Fall Risk Patient at Risk for Falls Due to: No Fall Risks; Impaired balance/gait; History of fall(s) Fall risk Follow up: Falls evaluation completed; Falls prevention discussed  Home and Transportation Safety: All rugs have non-skid backing?: N/A, no rugs All stairs or steps have railings?: N/A, no stairs Grab bars in the bathtub or shower?: yes Have non-skid surface in bathtub or shower?: yes Good home lighting?: yes Regular seat belt use?: yes Hospital stays in the last year:: no  Cognitive Assessment Difficulty concentrating, remembering, or making decisions? : no Will 6CIT or Mini Cog be Completed: yes What year is it?: 0 points What month is it?: 0 points Give patient an address phrase to remember (5 components): [address] [place], [place] About what time is it?: 0 points Count backwards from 20 to 1: 0 points Say the months of the year in reverse: 0 points Repeat the address phrase from earlier: 0 points 6 CIT Score: 0 points  Advance Directives (For Healthcare) Does Patient Have a Medical Advance Directive?: No Would patient like information on creating a medical advance directive?: No - Patient declined  Reviewed/Updated  Reviewed/Updated: Reviewed All (Medical, Surgical, Family, Medications, Allergies, Care Teams, Patient Goals)    Allergies (verified) Latex and Diclofenac   Current Medications (verified) Outpatient Encounter Medications as of 08/09/2024  Medication Sig   amLODipine-valsartan (EXFORGE) 5-160 MG tablet Take 1 tablet by mouth daily.   cetirizine  (ZYRTEC ) 10 MG tablet Take 1 tablet (10 mg total) by mouth daily. (Patient taking differently: Take 10 mg by mouth once as needed for allergies.)   clopidogrel  (PLAVIX ) 75 MG tablet Take 1 tablet (75 mg total) by mouth daily.  FLUoxetine  (PROZAC ) 10 MG  capsule Take 1 capsule (10 mg total) by mouth daily.   FLUoxetine  (PROZAC ) 10 MG tablet Take 10 mg by mouth daily. Started by Psychologist   HYDROcodone -acetaminophen  (NORCO) 7.5-325 MG tablet Take 1 tablet by mouth every 6 (six) hours.   methocarbamol  (ROBAXIN ) 500 MG tablet Take 500 mg by mouth daily as needed for muscle spasms (pain).   propranolol  ER (INDERAL  LA) 60 MG 24 hr capsule Take 1 capsule (60 mg total) by mouth daily.   rosuvastatin  (CRESTOR ) 40 MG tablet Take 1 tablet (40 mg total) by mouth at bedtime.   PARoxetine  (PAXIL ) 10 MG tablet Take 2 tablets (20 mg total) by mouth daily. (Patient not taking: Reported on 08/09/2024)   PARoxetine  (PAXIL ) 30 MG tablet Take 1 tablet by mouth once daily (Patient not taking: Reported on 08/09/2024)   No facility-administered encounter medications on file as of 08/09/2024.    History: Past Medical History:  Diagnosis Date   Arthritis    Complication of anesthesia    slow to wake up with nephrectomy   Depression    Headache    History of kidney stones    Pneumonia    Past Surgical History:  Procedure Laterality Date   CARPAL TUNNEL RELEASE Right 08/25/2022   HERNIA REPAIR     NEPHRECTOMY     TOTAL HIP ARTHROPLASTY Left 08/18/2021   Procedure: TOTAL HIP ARTHROPLASTY ANTERIOR APPROACH;  Surgeon: Ernie Cough, MD;  Location: [facility];  Service: Orthopedics;  Laterality: Left;   TUBAL LIGATION     UMBILICAL HERNIA REPAIR     History reviewed. No pertinent family history. Social History   Occupational History   Not on file  Tobacco Use   Smoking status: Every Day    Current packs/day: 0.25    Average packs/day: 0.3 packs/day for 28.1 years (7.0 ttl pk-yrs)    Types: Cigarettes    Start date: 07/12/1996    Passive exposure: Never   Smokeless tobacco: Never  Vaping Use   Vaping status: Never Used  Substance and Sexual Activity   Alcohol use: Not Currently   Drug use: Yes    Types: Marijuana    Comment: daily   Sexual activity:  Yes   Tobacco Counseling Ready to quit: No Counseling given: Yes  SDOH Screenings   Food Insecurity: No Food Insecurity (08/09/2024)  Housing: Unknown (08/09/2024)  Transportation Needs: No Transportation Needs (08/09/2024)  Utilities: Not At Risk (08/09/2024)  Alcohol Screen: Low Risk (08/04/2023)  Depression (PHQ2-9): Low Risk (08/09/2024)  Recent Concern: Depression (PHQ2-9) - High Risk (06/27/2024)  Financial Resource Strain: Low Risk (08/04/2023)  Physical Activity: Inactive (08/09/2024)  Social Connections: Socially Isolated (08/09/2024)  Stress: No Stress Concern Present (08/09/2024)  Tobacco Use: High Risk (08/09/2024)  Health Literacy: Adequate Health Literacy (08/09/2024)   See flowsheets for full screening details  Depression Screen PHQ 2 & 9 Depression Scale- Over the past 2 weeks, how often have you been bothered by any of the following problems? Little interest or pleasure in doing things: 0 Feeling down, depressed, or hopeless (PHQ Adolescent also includes...irritable): 0 PHQ-2 Total Score: 0 Trouble falling or staying asleep, or sleeping too much: 0 Feeling tired or having little energy: 0 Poor appetite or overeating (PHQ Adolescent also includes...weight loss): 0 Feeling bad about yourself - or that you are a failure or have let yourself or your family down: 0 Trouble concentrating on things, such as reading the newspaper or watching television (PHQ Adolescent also includes...like  school work): 0 Moving or speaking so slowly that other people could have noticed. Or the opposite - being so fidgety or restless that you have been moving around a lot more than usual: 3 (uses a cane) Thoughts that you would be better off dead, or of hurting yourself in some way: 0 PHQ-9 Total Score: 3 If you checked off any problems, how difficult have these problems made it for you to do your work, take care of things at home, or get along with other people?: Not difficult at all  Depression  Treatment Depression Interventions/Treatment : Medication; Currently on Treatment; PHQ2-9 Score <4 Follow-up Not Indicated; Counseling (sees M. Carrion-Carrero for Counseling Tax Inspector))     Goals Addressed               This Visit's Progress     Patient Stated (pt-stated)        Patient stated she plans to stay active             Objective:    Today's Vitals   08/09/24 1020  Weight: 144 lb (65.3 kg)  Height: 5' 4 (1.626 m)   Body mass index is 24.72 kg/m.  Hearing/Vision screen Hearing Screening - Comments:: Denies hearing difficulties   Vision Screening - Comments:: Wears rx glasses - up to date with routine eye exams with [facility] Immunizations and Health Maintenance Health Maintenance  Topic Date Due   Hepatitis B Vaccines 19-59 Average Risk (1 of 3 - 19+ 3-dose series) Never done   Medicare Annual Wellness (AWV)  08/09/2025   Mammogram  09/21/2025   Cervical Cancer Screening (HPV/Pap Cotest)  12/15/2025   HPV VACCINES (No Doses Required) Completed   Hepatitis C Screening  Completed   HIV Screening  Completed   Meningococcal B Vaccine  Aged Out   DTaP/Tdap/Td  Discontinued   Pneumococcal Vaccine  Discontinued   Influenza Vaccine  Discontinued   Colonoscopy  Discontinued   COVID-19 Vaccine  Discontinued        Assessment/Plan:  This is a routine wellness examination for Kristin Montes.  Patient Care Team: Tanda Bleacher, MD as PCP - General (Family Medicine) Homer Shams, MD as Resident (Psychiatry)  I have personally reviewed and noted the following in the patients chart:   Medical and social history Use of alcohol, tobacco or illicit drugs  Current medications and supplements including opioid prescriptions. Functional ability and status Nutritional status Physical activity Advanced directives List of other physicians Hospitalizations, surgeries, and ER visits in previous 12 months Vitals Screenings to include cognitive,  depression, and falls Referrals and appointments  No orders of the defined types were placed in this encounter.  In addition, I have reviewed and discussed with patient certain preventive protocols, quality metrics, and best practice recommendations. A written personalized care plan for preventive services as well as general preventive health recommendations were provided to patient.   Kristin Montes, CMA   08/09/2024   Return in 1 year (on 08/09/2025).  After Visit Summary: (MyChart) Due to this being a telephonic visit, the after visit summary with patients personalized plan was offered to patient via MyChart   Nurse Notes: scheduled 2027 AWV appt "
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
## 2024-08-13 ENCOUNTER — Telehealth (HOSPITAL_COMMUNITY): Admitting: Student in an Organized Health Care Education/Training Program

## 2024-08-13 DIAGNOSIS — F1290 Cannabis use, unspecified, uncomplicated: Secondary | ICD-10-CM

## 2024-08-13 DIAGNOSIS — F411 Generalized anxiety disorder: Secondary | ICD-10-CM

## 2024-08-13 DIAGNOSIS — F1011 Alcohol abuse, in remission: Secondary | ICD-10-CM

## 2024-08-13 DIAGNOSIS — F331 Major depressive disorder, recurrent, moderate: Secondary | ICD-10-CM

## 2024-08-13 DIAGNOSIS — F17200 Nicotine dependence, unspecified, uncomplicated: Secondary | ICD-10-CM

## 2024-08-13 DIAGNOSIS — F1411 Cocaine abuse, in remission: Secondary | ICD-10-CM

## 2024-08-14 NOTE — Addendum Note (Signed)
 Addended by: CARVIN CROCK on: 08/14/2024 08:00 AM   Modules accepted: Level of Service

## 2024-09-07 ENCOUNTER — Other Ambulatory Visit (HOSPITAL_COMMUNITY)

## 2024-09-24 ENCOUNTER — Ambulatory Visit (HOSPITAL_COMMUNITY): Admitting: Student in an Organized Health Care Education/Training Program

## 2024-11-28 ENCOUNTER — Ambulatory Visit: Payer: Self-pay | Admitting: Family Medicine

## 2025-01-28 ENCOUNTER — Ambulatory Visit: Admitting: Neurology

## 2025-05-24 ENCOUNTER — Encounter: Payer: Self-pay | Admitting: Family Medicine

## 2025-08-22 ENCOUNTER — Ambulatory Visit: Payer: Self-pay
